# Patient Record
Sex: Female | Born: 1970 | Race: White | Hispanic: No | Marital: Married | Smoking: Never smoker
Health system: Southern US, Community
[De-identification: ages and names within clinical notes are randomized; demographics above are authoritative.]

## PROBLEM LIST (undated history)

## (undated) DIAGNOSIS — N189 Chronic kidney disease, unspecified: Secondary | ICD-10-CM

## (undated) DIAGNOSIS — F32A Depression, unspecified: Secondary | ICD-10-CM

## (undated) DIAGNOSIS — F329 Major depressive disorder, single episode, unspecified: Secondary | ICD-10-CM

## (undated) DIAGNOSIS — G4730 Sleep apnea, unspecified: Secondary | ICD-10-CM

## (undated) DIAGNOSIS — M7989 Other specified soft tissue disorders: Secondary | ICD-10-CM

## (undated) DIAGNOSIS — C649 Malignant neoplasm of unspecified kidney, except renal pelvis: Secondary | ICD-10-CM

## (undated) DIAGNOSIS — D649 Anemia, unspecified: Secondary | ICD-10-CM

## (undated) DIAGNOSIS — O1490 Unspecified pre-eclampsia, unspecified trimester: Secondary | ICD-10-CM

## (undated) DIAGNOSIS — Z8489 Family history of other specified conditions: Secondary | ICD-10-CM

## (undated) DIAGNOSIS — E049 Nontoxic goiter, unspecified: Secondary | ICD-10-CM

## (undated) DIAGNOSIS — O139 Gestational [pregnancy-induced] hypertension without significant proteinuria, unspecified trimester: Secondary | ICD-10-CM

## (undated) DIAGNOSIS — K219 Gastro-esophageal reflux disease without esophagitis: Secondary | ICD-10-CM

## (undated) HISTORY — DX: Other specified soft tissue disorders: M79.89

## (undated) HISTORY — DX: Anemia, unspecified: D64.9

## (undated) HISTORY — DX: Sleep apnea, unspecified: G47.30

## (undated) HISTORY — DX: Unspecified pre-eclampsia, unspecified trimester: O14.90

---

## 1996-05-01 DIAGNOSIS — O1490 Unspecified pre-eclampsia, unspecified trimester: Secondary | ICD-10-CM

## 1996-05-01 DIAGNOSIS — O139 Gestational [pregnancy-induced] hypertension without significant proteinuria, unspecified trimester: Secondary | ICD-10-CM

## 1996-05-01 HISTORY — DX: Unspecified pre-eclampsia, unspecified trimester: O14.90

## 1996-05-01 HISTORY — DX: Gestational (pregnancy-induced) hypertension without significant proteinuria, unspecified trimester: O13.9

## 2006-05-01 HISTORY — PX: VAGINAL HYSTERECTOMY: SUR661

## 2011-09-07 ENCOUNTER — Encounter (INDEPENDENT_AMBULATORY_CARE_PROVIDER_SITE_OTHER): Payer: Self-pay | Admitting: Surgery

## 2011-09-07 ENCOUNTER — Ambulatory Visit (INDEPENDENT_AMBULATORY_CARE_PROVIDER_SITE_OTHER): Payer: BC Managed Care – PPO | Admitting: Surgery

## 2011-09-07 NOTE — Progress Notes (Signed)
Re:   Krystal Reynolds DOB:   Sep 11, 1970 MRN:   [id number]  ASSESSMENT AND PLAN: 1.  Morbid obesity  She is interested in weight loss surgery.  She was originally interested in the lap band, but is now considering the sleeve.  I discussed with her that if she has the sleeve, Dr. Biagio Quint is the one doing this in our office and she would need to see him to set up the surgery.  I can get the ball rolling while she decides.  Per the 1991 NIH Consensus Statement, the patient is a candidate for bariatric surgery.  The patient attended our information session and reviewed the different types of bariatric surgery.    The patient is trying to decide between the laparoscopic adjustable gastric band vs the sleeve gastrectomy.  I discussed with the patient the indications and risks of weight loss surgery.  The potential risks of surgery include, but are not limited to, bleeding, infection, DVT and PE, slippage and erosion of the band, open surgery, leak from the stomach, and death.  The patient understands the importance of compliance and long term follow-up with our group after surgery.  She has been given permits for both the lap band and the sleeve gastrectomy.  From here we'll obtain labs, x-rays, nutrition consult, and psych consult.  2.  Sleep apnea.  On CPAP. 3.  Right ear pain.   Chief Complaint  Patient presents with  . Weight Loss Surgery   REFERRING PHYSICIAN:  Dr. Marrian Salvage, [place], [place]  HISTORY OF PRESENT ILLNESS: Krystal Reynolds is a 41 y.o. (DOB: 1970/05/08)  white female whose primary care physician is Dr. Marrian Salvage, [place], and comes to me today for bariatric surgery consideration.  She has been to information sessions at [facility] and at [facility].  She has come here for re-imbursement reasons.  The patient has tried different wires to lose weight since she was a teenager. She felt like she is always on a diet.  She has tried Weight Watchers multiple times, [employer],  and watching her calories. She's tried phentermine. In college she tried an over-the-counter herbal diets medicine, but it made her heart race.  She is usually successful losing some weight, but then the weight creeps back and she weighs more than when she started.    Past Medical History  Diagnosis Date  . Hypertension   . Leg swelling      History reviewed. No pertinent past surgical history.    Current Outpatient Prescriptions  Medication Sig Dispense Refill  . citalopram (CELEXA) 20 MG tablet Take 20 mg by mouth daily.      . Topiramate (TOPAMAX PO) Take by mouth daily.         No Known Allergies  REVIEW OF SYSTEMS: Skin:  No history of rash.  No history of abnormal moles. Infection:  Right ear pain.  Etiology unknown.  Sees Crystal Rose with [facility]. Neurologic:  No history of stroke.  No history of seizure.  No history of headaches. Cardiac: She had pre-eclampsia with her first child.  She is not on meds for BP now. And No history of seeing a cardiologist. Pulmonary:  Sleep apnea on CPAP.  Endocrine:  No diabetes. She says she has an enlarged thyroid, but is on no meds and has had no biopsy. Gastrointestinal:  No history of stomach disease.  No history of liver disease.  No history of gall bladder disease.  No history of  pancreas disease.  No history of colon disease. GYN:  She had a vaginal hysterectomy about 4 years ago for bleeding. Urologic:  She says that she has had a history of benign proteinuria.  She is unsure of the status of this. Musculoskeletal:  No history of joint or back disease. Hematologic:  No bleeding disorder.  No history of anemia.  Not anticoagulated. Psycho-social:  The patient is oriented.   The patient has no obvious psychologic or social impairment to understanding our conversation and plan.  SOCIAL and FAMILY HISTORY: Married.  Has 2 children 11 and 14. Works in Cabin crew ed at [employer]  PHYSICAL  EXAM: BP 120/82  Pulse 60  Temp(Src) 98.1 F (36.7 C) (Temporal)  Resp 18  Ht 5\' 7"  (1.702 m)  Wt 253 lb (114.76 kg)  BMI 39.63 kg/m2  General: WN Obese WF who is alert and generally healthy appearing.  HEENT: Normal. Pupils equal. Good dentition. Neck: Supple. No mass.  No thyroid mass. Lymph Nodes:  No supraclavicular or cervical nodes. Lungs: Clear to auscultation and symmetric breath sounds. Heart:  RRR. No murmur or rub. Abdomen: Soft. No mass. No tenderness. No hernia. Normal bowel sounds.  No abdominal scars.  About half apple and half pear.  Rectal: Not done. Extremities:  Good strength and ROM  in upper and lower extremities. Neurologic:  Grossly intact to motor and sensory function. Psychiatric: Has normal mood and affect. Behavior is normal.   DATA REVIEWED: Notes provided by patient.  Ovidio Kin, MD,  [facility] [address]  [address]   [place], [place]    [address] Phone:  [phone] FAX:  [phone]

## 2011-09-18 ENCOUNTER — Encounter (INDEPENDENT_AMBULATORY_CARE_PROVIDER_SITE_OTHER): Payer: Self-pay | Admitting: General Surgery

## 2011-09-18 ENCOUNTER — Ambulatory Visit (INDEPENDENT_AMBULATORY_CARE_PROVIDER_SITE_OTHER): Payer: BC Managed Care – PPO | Admitting: General Surgery

## 2011-09-18 NOTE — Progress Notes (Signed)
Patient ID: Krystal Reynolds, female   DOB: 30-Apr-1971, 41 y.o.   MRN: [id number]  Chief Complaint  Patient presents with  . New Evaluation    New Bari Gastric Sleeve    HPI Krystal Reynolds is a 41 y.o. female.  This patient presents for evaluation for weight loss surgery. She has attended our information session and originally saw my partner for evaluation but she has decided that she is most interested in the sleeve gastrectomy. She has struggled with her weight "as long as she can remember" and even as a child. She has tried several diets including Weight Watchers and Renaldo Fiddler and is not currently doing dedicated exercise but says that she maintains an active last. She denies any reflux. She currently has a BMI of 39 with comorbidities of obstructive sleep apnea, headaches, and depression. HPI  Past Medical History  Diagnosis Date  . Hypertension   . Leg swelling     Past Surgical History  Procedure Date  . Abdominal hysterectomy 2008    No family history on file.  Social History History  Substance Use Topics  . Smoking status: Never Smoker   . Smokeless tobacco: Not on file  . Alcohol Use: Yes     rarely    No Known Allergies  Current Outpatient Prescriptions  Medication Sig Dispense Refill  . citalopram (CELEXA) 20 MG tablet Take 20 mg by mouth daily.      . Topiramate (TOPAMAX PO) Take by mouth daily.        Review of Systems Review of Systems All other review of systems negative or noncontributory except as stated in the HPI  Blood pressure 130/86, pulse 60, temperature 97.6 F (36.4 C), temperature source Temporal, resp. rate 16, height 5\' 7"  (1.702 m), weight 249 lb 9.6 oz (113.218 kg).  Physical Exam Physical Exam Physical Exam  Nursing note and vitals reviewed. Constitutional: She is oriented to person, place, and time. She appears well-developed and well-nourished. No distress.  HENT:  Head: Normocephalic and atraumatic.  Mouth/Throat: No oropharyngeal  exudate.  Eyes: Conjunctivae and EOM are normal. Pupils are equal, round, and reactive to light. Right eye exhibits no discharge. Left eye exhibits no discharge. No scleral icterus.  Neck: Normal range of motion. Neck supple. No tracheal deviation present.  Cardiovascular: Normal rate, regular rhythm, normal heart sounds and intact distal pulses.   Pulmonary/Chest: Effort normal and breath sounds normal. No stridor. No respiratory distress. She has no wheezes.  Abdominal: Soft. Bowel sounds are normal. She exhibits no distension and no mass. There is no tenderness. There is no rebound and no guarding.  Musculoskeletal: Normal range of motion. She exhibits no edema and no tenderness.  Neurological: She is alert and oriented to person, place, and time.  Skin: Skin is warm and dry. No rash noted. She is not diaphoretic. No erythema. No pallor.  Psychiatric: She has a normal mood and affect. Her behavior is normal. Judgment and thought content normal.    Data Reviewed   Assessment    Morbid obesity with a BMI of 39 and comorbidities of sleep apnea, headaches, and depression I think that she be a fine candidate for any of our way of procedures including the lap band, sleeve gastrectomy, or Roux-en-Y gastric bypass. We discussed the pros and cons of each procedure today including the risks and benefits and she remains interested in a sleeve gastrectomy. The risks of infection, bleeding, pain, scarring, weight regain, too little or too much weight  loss, vitamin deficiencies and need for lifelong vitamin supplementation, hair loss, need for protein supplementation, leaks, stricture, reflux, food intolerance, need for reoperation and conversion to roux Y gastric bypass, need for open surgery, injury to spleen or surrounding structures, DVT's, PE, and death again discussed with the patient and the patient expressed understanding. She is restarted the workup of her laboratory studies enemas are to schedule  for her nutrition and psychology evaluation as well as an upper GI.     Plan    She will continue with the bariatric workup and follow up when complete.       Krystal Reynolds DAVID 09/18/2011, 10:00 AM

## 2011-09-20 ENCOUNTER — Encounter (HOSPITAL_COMMUNITY)
Admission: RE | Disposition: A | Discharge status: Home / Self Care | Payer: Self-pay | Source: Ambulatory Visit | Attending: Surgery

## 2011-09-20 ENCOUNTER — Ambulatory Visit (HOSPITAL_COMMUNITY)
Admission: RE | Admit: 2011-09-20 | Discharge: 2011-09-20 | Disposition: A | Discharge status: Home / Self Care | Payer: BC Managed Care – PPO | Source: Ambulatory Visit | Attending: Surgery | Admitting: Surgery

## 2011-09-20 HISTORY — PX: BREATH TEK H PYLORI: SHX5422

## 2011-09-20 SURGERY — BREATH TEST, FOR HELICOBACTER PYLORI
Anesthesia: Choice

## 2011-09-21 ENCOUNTER — Encounter (HOSPITAL_COMMUNITY): Payer: Self-pay

## 2011-09-21 ENCOUNTER — Encounter (HOSPITAL_COMMUNITY): Payer: Self-pay | Admitting: Surgery

## 2011-09-28 ENCOUNTER — Ambulatory Visit (HOSPITAL_COMMUNITY)
Admission: RE | Admit: 2011-09-28 | Discharge: 2011-09-28 | Disposition: A | Discharge status: Home / Self Care | Payer: BC Managed Care – PPO | Source: Ambulatory Visit | Attending: Surgery | Admitting: Surgery

## 2011-09-28 ENCOUNTER — Other Ambulatory Visit: Payer: Self-pay

## 2011-09-28 DIAGNOSIS — I10 Essential (primary) hypertension: Secondary | ICD-10-CM | POA: Insufficient documentation

## 2011-09-28 DIAGNOSIS — G4730 Sleep apnea, unspecified: Secondary | ICD-10-CM | POA: Insufficient documentation

## 2011-09-28 DIAGNOSIS — Z6839 Body mass index (BMI) 39.0-39.9, adult: Secondary | ICD-10-CM | POA: Insufficient documentation

## 2011-09-30 ENCOUNTER — Encounter: Payer: BC Managed Care – PPO | Attending: Obstetrics and Gynecology | Admitting: *Deleted

## 2011-09-30 ENCOUNTER — Encounter: Payer: Self-pay | Admitting: *Deleted

## 2011-09-30 DIAGNOSIS — Z01818 Encounter for other preprocedural examination: Secondary | ICD-10-CM | POA: Insufficient documentation

## 2011-09-30 DIAGNOSIS — Z713 Dietary counseling and surveillance: Secondary | ICD-10-CM | POA: Insufficient documentation

## 2011-09-30 NOTE — Progress Notes (Addendum)
  Pre-Op Assessment Visit:  Pre-Operative Gastric Sleeve Surgery  Medical Nutrition Therapy:  Appt start time: 0800   End time:  0900.  Patient was seen on 09/30/2011 for Pre-Operative RYGB or LAGB Nutrition Assessment. Assessment and letter of approval faxed to [facility] Bariatric Surgery Program coordinator on 09/30/2011.  Approval letter sent to [facility] and will be available in the chart under the media tab.  TANITA  BODY COMP RESULTS  09/30/11   %Fat 49.4%   Fat Mass (lbs) 122.5   Fat Free Mass (lbs) 125.5   Total Body Water (lbs) 92.0   Handouts given during visit include:  Pre-Op Goals   Bariatric Surgery Protein Shakes  Low CHO Snack List  Patient to call for Pre-Op and Post-Op Nutrition Education at [facility] when surgery is scheduled.

## 2011-09-30 NOTE — Patient Instructions (Addendum)
   Follow Pre-Op Nutrition Goals to prepare for Gastric Sleeve Surgery.   Call the [facility] at [phone] once you have been given your surgery date to enrolled in the Pre-Op Nutrition Class. You will need to attend this nutrition class 3-4 weeks prior to your surgery.  

## 2011-10-03 ENCOUNTER — Other Ambulatory Visit (INDEPENDENT_AMBULATORY_CARE_PROVIDER_SITE_OTHER): Payer: Self-pay | Admitting: General Surgery

## 2011-10-03 DIAGNOSIS — K862 Cyst of pancreas: Secondary | ICD-10-CM

## 2011-10-09 ENCOUNTER — Other Ambulatory Visit: Payer: BC Managed Care – PPO

## 2011-10-10 ENCOUNTER — Telehealth (INDEPENDENT_AMBULATORY_CARE_PROVIDER_SITE_OTHER): Payer: Self-pay

## 2011-10-10 ENCOUNTER — Other Ambulatory Visit (INDEPENDENT_AMBULATORY_CARE_PROVIDER_SITE_OTHER): Payer: Self-pay

## 2011-10-10 DIAGNOSIS — K862 Cyst of pancreas: Secondary | ICD-10-CM

## 2011-10-10 NOTE — Telephone Encounter (Signed)
Kara at [facility] called to ask for order on pt to be changed to CT with and without. She states Dr Biagio Quint called and wants both but order put in with only. You can reach [place] at [phone].

## 2011-10-13 ENCOUNTER — Other Ambulatory Visit: Payer: BC Managed Care – PPO

## 2011-10-13 ENCOUNTER — Telehealth (INDEPENDENT_AMBULATORY_CARE_PROVIDER_SITE_OTHER): Payer: Self-pay

## 2011-10-13 ENCOUNTER — Ambulatory Visit
Admission: RE | Admit: 2011-10-13 | Discharge: 2011-10-13 | Disposition: A | Discharge status: Home / Self Care | Payer: BC Managed Care – PPO | Source: Ambulatory Visit | Attending: General Surgery | Admitting: General Surgery

## 2011-10-13 ENCOUNTER — Telehealth (INDEPENDENT_AMBULATORY_CARE_PROVIDER_SITE_OTHER): Payer: Self-pay | Admitting: General Surgery

## 2011-10-13 ENCOUNTER — Inpatient Hospital Stay
Admission: RE | Admit: 2011-10-13 | Discharge: 2011-10-13 | Payer: BC Managed Care – PPO | Source: Ambulatory Visit | Attending: General Surgery | Admitting: General Surgery

## 2011-10-13 DIAGNOSIS — K862 Cyst of pancreas: Secondary | ICD-10-CM

## 2011-10-13 DIAGNOSIS — N2889 Other specified disorders of kidney and ureter: Secondary | ICD-10-CM

## 2011-10-13 MED ORDER — IOHEXOL 300 MG/ML  SOLN
125.0000 mL | Freq: Once | INTRAMUSCULAR | Status: AC | PRN
  Administered 2011-10-13: 125 mL via INTRAVENOUS

## 2011-10-13 NOTE — Telephone Encounter (Signed)
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  I have an appointment set up for her 10/30/11 at 2:15pm with Dr. Donell Beers and I have placed a consult for urology eval as well. She expressed understanding and agreed to follow up with these specialists.  We will hold on weight loss surgery for now until this is resolved.

## 2011-10-13 NOTE — Telephone Encounter (Signed)
Patient given CT abd/pelvis results by Dr. Biagio Quint, patient need's further work up and has been referred to Dr. Donell Beers for evaluation & treatment.  Patient has an appointment on 10/30/2011 @ 2:15 pm and has been notified, per Dr. Biagio Quint patient needs Urology Referral as well, we will refer patient to [facility].  Weight Loss Surgery can be discussed at a later date.

## 2011-10-30 ENCOUNTER — Encounter (INDEPENDENT_AMBULATORY_CARE_PROVIDER_SITE_OTHER): Payer: Self-pay | Admitting: General Surgery

## 2011-10-30 ENCOUNTER — Other Ambulatory Visit (INDEPENDENT_AMBULATORY_CARE_PROVIDER_SITE_OTHER): Payer: Self-pay | Admitting: General Surgery

## 2011-10-30 ENCOUNTER — Ambulatory Visit (INDEPENDENT_AMBULATORY_CARE_PROVIDER_SITE_OTHER): Payer: BC Managed Care – PPO | Admitting: General Surgery

## 2011-10-30 VITALS — BP 152/100 | HR 72 | Temp 98.4°F | Resp 18 | Ht 67.0 in | Wt 246.8 lb

## 2011-10-30 DIAGNOSIS — K869 Disease of pancreas, unspecified: Secondary | ICD-10-CM

## 2011-10-30 DIAGNOSIS — K8689 Other specified diseases of pancreas: Secondary | ICD-10-CM

## 2011-10-30 NOTE — Assessment & Plan Note (Addendum)
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  Could be pseudocyst vs cystic neoplasm.   Will refer to Dr. Christella Hartigan for endoscopic ultrasound.   Will also make sure she has seen urology for right kidney mass.    Unless area is consistent with pseudocyst, would resect due to total size of mass.   Briefly discussed whipple with pt.    Will follow up in 2 weeks after urology evaluation and EUS.

## 2011-10-30 NOTE — Patient Instructions (Addendum)
Get endoscopic ultrasound and biopsy.  See urology.    Follow up with me after those events.

## 2011-10-31 ENCOUNTER — Telehealth (INDEPENDENT_AMBULATORY_CARE_PROVIDER_SITE_OTHER): Payer: Self-pay

## 2011-10-31 LAB — CANCER ANTIGEN 19-9: CA 19-9: 4.3 U/mL (ref <35.0)

## 2011-10-31 NOTE — Telephone Encounter (Signed)
Pt notified that records/labs were faxed to Dr. Laverle Patter at [facility].  They will call me with an appt, and I will let her know.

## 2011-11-01 ENCOUNTER — Other Ambulatory Visit (INDEPENDENT_AMBULATORY_CARE_PROVIDER_SITE_OTHER): Payer: Self-pay | Admitting: General Surgery

## 2011-11-01 DIAGNOSIS — K8689 Other specified diseases of pancreas: Secondary | ICD-10-CM

## 2011-11-01 NOTE — Progress Notes (Signed)
Quick Note:  Please let pt know tumor marker is normal and NOT elevated ______

## 2011-11-03 ENCOUNTER — Telehealth: Payer: Self-pay

## 2011-11-03 DIAGNOSIS — K862 Cyst of pancreas: Secondary | ICD-10-CM

## 2011-11-03 NOTE — Telephone Encounter (Signed)
Message copied by Donata Duff on Fri Nov 03, 2011  1:26 PM ------      Message from: Rachael Fee      Created: Fri Nov 03, 2011 12:58 PM      Regarding: RE: Pt need EUS scheduled      Contact: [phone]       She needs upper EUS, radial +/- linear.  Next available eus Thursday with propofol.  Dx: pancreatic cysts            Thanks            ----- Message -----         From: Donata Duff, CMA         Sent: 11/03/2011  11:56 AM           To: Rachael Fee, MD      Subject: FW: Pt need EUS scheduled                                            ----- Message -----         From: Erin Sons         Sent: 11/03/2011  11:42 AM           To: Donata Duff, CMA      Subject: Pt need EUS scheduled                                    Hi Patti,            This pt has a referral in Epic and need to see Dr Christella Hartigan for EUS. Pt can't be seen 7/10-7/13 won't be available these dates.            Thanks      Erin Sons      Referral Coordinator      [phone] ext [phone]

## 2011-11-06 ENCOUNTER — Other Ambulatory Visit: Payer: Self-pay

## 2011-11-07 ENCOUNTER — Telehealth (INDEPENDENT_AMBULATORY_CARE_PROVIDER_SITE_OTHER): Payer: Self-pay

## 2011-11-07 NOTE — Telephone Encounter (Signed)
Pt is scheduled to see Dr. Pamala Hurry on 11/21/11 at 9:00 a.m.  She is aware.

## 2011-11-07 NOTE — Telephone Encounter (Signed)
Left message on machine to call back  

## 2011-11-08 NOTE — Progress Notes (Signed)
Chief Complaint  Patient presents with  . Other    new pt- eval pancreatic pseudocyst    HISTORY: Pt is a 40 year old female who presented with an incidentally discovered pancreatic mass as part of the work up for bariatric surgery.  She also was found to have a kidney mass. She had abdominal pain at one point but has not history of pancreatitis that she is aware of. She is not a drinker. She takes minimal medications. She denies diabetes. She has no family history of pancreatic cancer or pancreatic masses. She denies diarrhea or other signs of pancreatic insufficiency. She is not having chronic abdominal pain at this point. She denies nausea, vomiting, early satiety. She denies weight loss.  Past Medical History  Diagnosis Date  . Hypertension   . Leg swelling   . Preeclampsia 1998    first pregnancy - not currently pregnant  . Sleep apnea   . Anemia     Past Surgical History  Procedure Date  . Abdominal hysterectomy 2008  . Breath tek h pylori 09/20/2011    Procedure: BREATH TEK H PYLORI;  Surgeon: David H Newman, MD;  Location: [facility];  Service: General;  Laterality: N/A;    Current Outpatient Prescriptions  Medication Sig Dispense Refill  . citalopram (CELEXA) 20 MG tablet Take 20 mg by mouth daily.      . Topiramate (TOPAMAX PO) Take 100 mg by mouth daily.          No Known Allergies   Family History  Problem Relation Age of Onset  . Cancer Paternal Uncle     lung     History   Social History  . Marital Status: Married    Spouse Name: N/A    Number of Children: N/A  . Years of Education: N/A   Social History Main Topics  . Smoking status: Never Smoker   . Smokeless tobacco: None  . Alcohol Use: Yes     rarely  . Drug Use: No  . Sexually Active:     REVIEW OF SYSTEMS - PERTINENT POSITIVES ONLY: 12 point review of systems negative other than HPI and PMH.    EXAM: Filed Vitals:   10/30/11 1358  BP: 152/100  Pulse: 72  Temp: 98.4 F (36.9 C)    Resp: 18    Gen:  No acute distress.  Well nourished and well groomed.   Neurological: Alert and oriented to person, place, and time. Coordination normal.  Head: Normocephalic and atraumatic.  Eyes: Conjunctivae are normal. Pupils are equal, round, and reactive to light. No scleral icterus.  Neck: Normal range of motion. Neck supple. No tracheal deviation or thyromegaly present.  Cardiovascular: Normal rate, regular rhythm, normal heart sounds and intact distal pulses.  Exam reveals no gallop and no friction rub.  No murmur heard. Respiratory: Effort normal.  No respiratory distress. No chest wall tenderness. Breath sounds normal.  No wheezes, rales or rhonchi.  GI: Soft. Bowel sounds are normal. The abdomen is soft and nontender.  There is no rebound and no guarding.  Musculoskeletal: Normal range of motion. Extremities are nontender.  Lymphadenopathy: No cervical, preauricular, postauricular or axillary adenopathy is present Skin: Skin is warm and dry. No rash noted. No diaphoresis. No erythema. No pallor. No clubbing, cyanosis, or edema.   Psychiatric: Normal mood and affect. Behavior is normal. Judgment and thought content normal.    LABORATORY RESULTS: CBC with elevated WBCs, normal LFTs.     RADIOLOGY RESULTS: CT   scan abd/pelvis IMPRESSION:  1. Lobular cystic thin-walled structure associated with the  pancreatic head. Possible pancreatic pseudocyts, but cannot  exclude cystic pancreatic neoplasm. If there is no history of  pancreatitis then endoscopic ultrasound is recommended. 2.8 + 2.0 cm 2. However, there is an enhancing rounded mass emanating from the  lower pole of the right kidney of 3.0 x 2.6 x 3.0 cm highly  suspicious for renal cell carcinoma. No adenopathy is seen.   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  Could be pseudocyst vs cystic neoplasm.   Will refer to Dr. Jacobs for endoscopic ultrasound.    Will also make sure she has seen urology for right kidney mass.    Unless area is consistent with pseudocyst, would resect due to total size of mass.   Briefly discussed whipple with pt.    Will follow up in 2 weeks after urology evaluation and EUS.     Eriona Kinchen L Braeleigh Pyper MD Surgical Oncology, General and Endocrine Surgery [facility]      Visit Diagnoses: 1. Pancreatic head mass     Primary Care Physician: DORTON,PHILLIP K., MD    

## 2011-11-08 NOTE — Telephone Encounter (Signed)
Pt returned call and was given her date and time she was unable to keep that appt date and it was changed to 12/07/11 1130 am pt is aware and has been instructed.  Noreene Larsson was called and appt changed and confirmed

## 2011-11-10 ENCOUNTER — Telehealth (INDEPENDENT_AMBULATORY_CARE_PROVIDER_SITE_OTHER): Payer: Self-pay

## 2011-11-10 NOTE — Telephone Encounter (Signed)
Pt notified of lab result per Dr Arita Miss request.

## 2011-11-10 NOTE — Telephone Encounter (Signed)
LMOM for pt to call for lab results. If pt returns call the ca 19-9 is normal [ not elevated].

## 2011-11-13 ENCOUNTER — Encounter (INDEPENDENT_AMBULATORY_CARE_PROVIDER_SITE_OTHER): Payer: BC Managed Care – PPO | Admitting: General Surgery

## 2011-12-07 ENCOUNTER — Ambulatory Visit (HOSPITAL_COMMUNITY): Payer: BC Managed Care – PPO | Admitting: Anesthesiology

## 2011-12-07 ENCOUNTER — Ambulatory Visit (HOSPITAL_COMMUNITY)
Admission: RE | Admit: 2011-12-07 | Discharge: 2011-12-07 | Disposition: A | Discharge status: Home / Self Care | Payer: BC Managed Care – PPO | Source: Ambulatory Visit | Attending: Gastroenterology | Admitting: Gastroenterology

## 2011-12-07 ENCOUNTER — Encounter (HOSPITAL_COMMUNITY): Payer: Self-pay | Admitting: Anesthesiology

## 2011-12-07 ENCOUNTER — Encounter (HOSPITAL_COMMUNITY)
Admission: RE | Disposition: A | Discharge status: Home / Self Care | Payer: Self-pay | Source: Ambulatory Visit | Attending: Gastroenterology

## 2011-12-07 ENCOUNTER — Encounter (HOSPITAL_COMMUNITY): Payer: Self-pay

## 2011-12-07 DIAGNOSIS — Z79899 Other long term (current) drug therapy: Secondary | ICD-10-CM | POA: Insufficient documentation

## 2011-12-07 DIAGNOSIS — K863 Pseudocyst of pancreas: Secondary | ICD-10-CM | POA: Insufficient documentation

## 2011-12-07 DIAGNOSIS — K8689 Other specified diseases of pancreas: Secondary | ICD-10-CM

## 2011-12-07 DIAGNOSIS — K862 Cyst of pancreas: Secondary | ICD-10-CM | POA: Insufficient documentation

## 2011-12-07 DIAGNOSIS — G4730 Sleep apnea, unspecified: Secondary | ICD-10-CM | POA: Insufficient documentation

## 2011-12-07 DIAGNOSIS — N2889 Other specified disorders of kidney and ureter: Secondary | ICD-10-CM

## 2011-12-07 DIAGNOSIS — I10 Essential (primary) hypertension: Secondary | ICD-10-CM | POA: Insufficient documentation

## 2011-12-07 DIAGNOSIS — K869 Disease of pancreas, unspecified: Secondary | ICD-10-CM

## 2011-12-07 HISTORY — PX: EUS: SHX5427

## 2011-12-07 SURGERY — UPPER ENDOSCOPIC ULTRASOUND (EUS) LINEAR
Anesthesia: General

## 2011-12-07 MED ORDER — FENTANYL CITRATE 0.05 MG/ML IJ SOLN
INTRAMUSCULAR | Status: DC | PRN
  Administered 2011-12-07: 100 ug via INTRAVENOUS

## 2011-12-07 MED ORDER — CIPROFLOXACIN IN D5W 400 MG/200ML IV SOLN
INTRAVENOUS | Status: AC
  Filled 2011-12-07: qty 200

## 2011-12-07 MED ORDER — BUTAMBEN-TETRACAINE-BENZOCAINE 2-2-14 % EX AERO
INHALATION_SPRAY | CUTANEOUS | Status: DC | PRN
  Administered 2011-12-07: 2 via TOPICAL

## 2011-12-07 MED ORDER — CIPROFLOXACIN HCL 500 MG PO TABS: 500.0000 mg | ORAL_TABLET | Freq: Two times a day (BID) | ORAL | Status: AC

## 2011-12-07 MED ORDER — CIPROFLOXACIN IN D5W 400 MG/200ML IV SOLN
400.0000 mg | Freq: Two times a day (BID) | INTRAVENOUS | Status: DC
  Administered 2011-12-07: 400 mg via INTRAVENOUS

## 2011-12-07 MED ORDER — MIDAZOLAM HCL 5 MG/5ML IJ SOLN
INTRAMUSCULAR | Status: DC | PRN
  Administered 2011-12-07: 2 mg via INTRAVENOUS
  Administered 2011-12-07: 1 mg via INTRAVENOUS

## 2011-12-07 MED ORDER — KETAMINE HCL 10 MG/ML IJ SOLN
INTRAMUSCULAR | Status: DC | PRN
  Administered 2011-12-07 (×3): 10 mg via INTRAVENOUS

## 2011-12-07 MED ORDER — LACTATED RINGERS IV SOLN
INTRAVENOUS | Status: DC | PRN
  Administered 2011-12-07: 10:00:00 via INTRAVENOUS

## 2011-12-07 MED ORDER — PROPOFOL 10 MG/ML IV EMUL
INTRAVENOUS | Status: DC | PRN
  Administered 2011-12-07: 100 ug/kg/min via INTRAVENOUS

## 2011-12-07 NOTE — Discharge Instructions (Signed)

## 2011-12-07 NOTE — Anesthesia Postprocedure Evaluation (Signed)
  Anesthesia Post-op Note  Patient: Krystal Reynolds  Procedure(s) Performed: Procedure(s) (LRB): UPPER ENDOSCOPIC ULTRASOUND (EUS) LINEAR (N/A)  Patient Location: PACU  Anesthesia Type: MAC  Level of Consciousness: awake and alert   Airway and Oxygen Therapy: Patient Spontanous Breathing  Post-op Pain: mild  Post-op Assessment: Post-op Vital signs reviewed, Patient's Cardiovascular Status Stable, Respiratory Function Stable, Patent Airway and No signs of Nausea or vomiting  Post-op Vital Signs: stable  Complications: No apparent anesthesia complications

## 2011-12-07 NOTE — Transfer of Care (Signed)
Immediate Anesthesia Transfer of Care Note  Patient: Krystal Reynolds  Procedure(s) Performed: Procedure(s) (LRB): UPPER ENDOSCOPIC ULTRASOUND (EUS) LINEAR (N/A)  Patient Location: PACU and Endoscopy Unit  Anesthesia Type: MAC  Level of Consciousness: awake, alert , oriented and patient cooperative  Airway & Oxygen Therapy: Patient Spontanous Breathing and Patient connected to nasal cannula oxygen  Post-op Assessment: Report given to PACU RN and Post -op Vital signs reviewed and stable  Post vital signs: Reviewed and stable  Complications: No apparent anesthesia complications

## 2011-12-07 NOTE — Op Note (Signed)
[facility] [address] [place], [place]  [address]  ENDOSCOPIC ULTRASOUND PROCEDURE REPORT PATIENT:  Krystal Reynolds, Krystal Reynolds  MR#:  [id number] BIRTHDATE:  1970-08-01  GENDER:  female ENDOSCOPIST:  Rachael Fee, MD REFERRED BY:  Almond Lint, M.D. PROCEDURE DATE:  12/07/2011 PROCEDURE:  Upper EUS w/FNA ASA CLASS:  Class III INDICATIONS:  incidentally noted pancreatic head/neck cyst. On same CT a right renal mass was also noted, urology appt next week. No history of pancreatitis, non-drinker, weight stable, no pancreatic disease in family MEDICATIONS:   MAC sedation, administered by CRNA DESCRIPTION OF PROCEDURE:   After the risks benefits and alternatives of the procedure were  explained, informed consent was obtained. The patient was then placed in the left, lateral, decubitus postion and IV sedation was administered. Throughout the procedure, the patient's blood pressure, pulse and oxygen saturations were monitored continuously.  Under direct visualization, the 110036 endoscope was introduced through the mouth and advanced to the second portion of the duodenum.  Water was used as necessary to provide an acoustic interface.  Upon completion of the imaging, water was removed and the patient was sent to the recovery room in satisfactory condition. <<PROCEDUREIMAGES>> Endoscopic findings: 1. Normal UGI tract  EUS findings: 1. Mutlicystic lesion in head/neck of pancreas with thin septea and no associated solid masses or mural nodules. The cystic process measures 4.1cm maximally. Largest cyst cavity 2.5cm across.  The main pancreatic duct appears to communicate with the cyst.  A single pass with a 22 gauge BS EUS FNA needle was used to incompletely aspirate the cystic fluid.  17cc of thin, amber colored fluid was aspirated. 2. Main pancreatic duct appears to communicate with the cystic lesion but was otherwise normal (non-dilated). 3. Pancreatic parenchyma was otherwise  normal apeparing without solid mass lesions or signs of chronic pancreatitis. 4. No peripancreatic adenopathy. 5. CBD was normal, non-dilated 6. Limited views of liver, spleen, portal and splenic vessels were all normal  Impression: 4.1cm multicystic lesion in head/neck of pancreas, appears to communicate with main pancreatic duct. No concerning features of associated solid masses or main pancreatic duct dilation.  The cystic lesion was aspirated thin, amber fluid was sent for CEA, amylase and cytology.  She will comlete 3 days of cipro.  Of important note, she is seeing a Urologist about the right renal mass next week.  ______________________________ Rachael Fee, MD  n. eSIGNED:   Rachael Fee at 12/07/2011 11:18 AM  Hiram Comber, [id number]

## 2011-12-07 NOTE — Anesthesia Preprocedure Evaluation (Addendum)
Anesthesia Evaluation  Patient identified by MRN, date of birth, ID band Patient awake    Reviewed: Allergy & Precautions, H&P , NPO status , Patient's Chart, lab work & pertinent test results  Airway       Dental   Pulmonary sleep apnea and Continuous Positive Airway Pressure Ventilation ,          Cardiovascular hypertension,     Neuro/Psych  Headaches, negative psych ROS   GI/Hepatic negative GI ROS, Neg liver ROS,   Endo/Other  negative endocrine ROS  Renal/GU negative Renal ROS  negative genitourinary   Musculoskeletal negative musculoskeletal ROS (+)   Abdominal (+) + obese,   Peds negative pediatric ROS (+)  Hematology negative hematology ROS (+)   Anesthesia Other Findings   Reproductive/Obstetrics negative OB ROS S/p hysterectomy.                         Anesthesia Physical Anesthesia Plan  ASA: II  Anesthesia Plan: MAC   Post-op Pain Management:    Induction: Intravenous  Airway Management Planned:   Additional Equipment:   Intra-op Plan:   Post-operative Plan: Extubation in OR  Informed Consent: I have reviewed the patients History and Physical, chart, labs and discussed the procedure including the risks, benefits and alternatives for the proposed anesthesia with the patient or authorized representative who has indicated his/her understanding and acceptance.   Dental advisory given  Plan Discussed with: CRNA  Anesthesia Plan Comments:        Anesthesia Quick Evaluation

## 2011-12-07 NOTE — Interval H&P Note (Signed)
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 After consideration of risks, benefits and other options for treatment, the patient has consented to  Procedure(s) (LRB): UPPER ENDOSCOPIC ULTRASOUND (EUS) LINEAR (N/A) as a surgical intervention .  The patient's history has been reviewed, patient examined, no change in status, stable for surgery.  I have reviewed the patient's chart and labs.  Questions were answered to the patient's satisfaction.     Rob Bunting

## 2011-12-07 NOTE — Progress Notes (Signed)
Vital signs during EUS procedure taken by Andrey Campanile, CRNA

## 2011-12-07 NOTE — H&P (View-Only) (Signed)
Chief Complaint  Patient presents with  . Other    new pt- eval pancreatic pseudocyst    HISTORY: Pt is a 41 year old female who presented with an incidentally discovered pancreatic mass as part of the work up for bariatric surgery.  She also was found to have a kidney mass. She had abdominal pain at one point but has not history of pancreatitis that she is aware of. She is not a drinker. She takes minimal medications. She denies diabetes. She has no family history of pancreatic cancer or pancreatic masses. She denies diarrhea or other signs of pancreatic insufficiency. She is not having chronic abdominal pain at this point. She denies nausea, vomiting, early satiety. She denies weight loss.  Past Medical History  Diagnosis Date  . Hypertension   . Leg swelling   . Preeclampsia 1998    first pregnancy - not currently pregnant  . Sleep apnea   . Anemia     Past Surgical History  Procedure Date  . Abdominal hysterectomy 2008  . Breath tek h pylori 09/20/2011    Procedure: BREATH TEK H PYLORI;  Surgeon: Kandis Cocking, MD;  Location: [facility];  Service: General;  Laterality: N/A;    Current Outpatient Prescriptions  Medication Sig Dispense Refill  . citalopram (CELEXA) 20 MG tablet Take 20 mg by mouth daily.      . Topiramate (TOPAMAX PO) Take 100 mg by mouth daily.          No Known Allergies   Family History  Problem Relation Age of Onset  . Cancer Paternal Uncle     lung     History   Social History  . Marital Status: Married    Spouse Name: N/A    Number of Children: N/A  . Years of Education: N/A   Social History Main Topics  . Smoking status: Never Smoker   . Smokeless tobacco: None  . Alcohol Use: Yes     rarely  . Drug Use: No  . Sexually Active:     REVIEW OF SYSTEMS - PERTINENT POSITIVES ONLY: 12 point review of systems negative other than HPI and PMH.    EXAM: Filed Vitals:   10/30/11 1358  BP: 152/100  Pulse: 72  Temp: 98.4 F (36.9 C)    Resp: 18    Gen:  No acute distress.  Well nourished and well groomed.   Neurological: Alert and oriented to person, place, and time. Coordination normal.  Head: Normocephalic and atraumatic.  Eyes: Conjunctivae are normal. Pupils are equal, round, and reactive to light. No scleral icterus.  Neck: Normal range of motion. Neck supple. No tracheal deviation or thyromegaly present.  Cardiovascular: Normal rate, regular rhythm, normal heart sounds and intact distal pulses.  Exam reveals no gallop and no friction rub.  No murmur heard. Respiratory: Effort normal.  No respiratory distress. No chest wall tenderness. Breath sounds normal.  No wheezes, rales or rhonchi.  GI: Soft. Bowel sounds are normal. The abdomen is soft and nontender.  There is no rebound and no guarding.  Musculoskeletal: Normal range of motion. Extremities are nontender.  Lymphadenopathy: No cervical, preauricular, postauricular or axillary adenopathy is present Skin: Skin is warm and dry. No rash noted. No diaphoresis. No erythema. No pallor. No clubbing, cyanosis, or edema.   Psychiatric: Normal mood and affect. Behavior is normal. Judgment and thought content normal.    LABORATORY RESULTS: CBC with elevated WBCs, normal LFTs.     RADIOLOGY RESULTS: CT  scan abd/pelvis IMPRESSION:  1. Lobular cystic thin-walled structure associated with the  pancreatic head. Possible pancreatic pseudocyts, but cannot  exclude cystic pancreatic neoplasm. If there is no history of  pancreatitis then endoscopic ultrasound is recommended. 2.8 + 2.0 cm 2. However, there is an enhancing rounded mass emanating from the  lower pole of the right kidney of 3.0 x 2.6 x 3.0 cm highly  suspicious for renal cell carcinoma. No adenopathy is seen.   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  Could be pseudocyst vs cystic neoplasm.   Will refer to Dr. Christella Hartigan for endoscopic ultrasound.    Will also make sure she has seen urology for right kidney mass.    Unless area is consistent with pseudocyst, would resect due to total size of mass.   Briefly discussed whipple with pt.    Will follow up in 2 weeks after urology evaluation and EUS.     Maudry Diego MD Surgical Oncology, General and Endocrine Surgery [facility]      Visit Diagnoses: 1. Pancreatic head mass     Primary Care Physician: Bernita Raisin., MD

## 2011-12-08 ENCOUNTER — Encounter (HOSPITAL_COMMUNITY): Payer: Self-pay | Admitting: Gastroenterology

## 2011-12-11 ENCOUNTER — Encounter (INDEPENDENT_AMBULATORY_CARE_PROVIDER_SITE_OTHER): Payer: Self-pay

## 2011-12-12 ENCOUNTER — Other Ambulatory Visit: Payer: Self-pay | Admitting: Urology

## 2011-12-12 ENCOUNTER — Telehealth: Payer: Self-pay | Admitting: Gastroenterology

## 2011-12-12 ENCOUNTER — Other Ambulatory Visit (HOSPITAL_COMMUNITY): Payer: Self-pay | Admitting: Urology

## 2011-12-12 ENCOUNTER — Ambulatory Visit (HOSPITAL_COMMUNITY)
Admission: RE | Admit: 2011-12-12 | Discharge: 2011-12-12 | Disposition: A | Discharge status: Home / Self Care | Payer: BC Managed Care – PPO | Source: Ambulatory Visit | Attending: Urology | Admitting: Urology

## 2011-12-12 DIAGNOSIS — D4959 Neoplasm of unspecified behavior of other genitourinary organ: Secondary | ICD-10-CM

## 2011-12-28 ENCOUNTER — Encounter (HOSPITAL_COMMUNITY): Payer: Self-pay | Admitting: Pharmacy Technician

## 2011-12-31 HISTORY — PX: ROBOTIC ASSITED PARTIAL NEPHRECTOMY: SHX6087

## 2012-01-02 ENCOUNTER — Encounter (HOSPITAL_COMMUNITY)
Admission: RE | Admit: 2012-01-02 | Discharge: 2012-01-02 | Disposition: A | Discharge status: Home / Self Care | Payer: BC Managed Care – PPO | Source: Ambulatory Visit | Attending: Urology | Admitting: Urology

## 2012-01-02 ENCOUNTER — Encounter (HOSPITAL_COMMUNITY): Payer: Self-pay

## 2012-01-02 DIAGNOSIS — D649 Anemia, unspecified: Secondary | ICD-10-CM

## 2012-01-02 DIAGNOSIS — M7989 Other specified soft tissue disorders: Secondary | ICD-10-CM

## 2012-01-02 DIAGNOSIS — N189 Chronic kidney disease, unspecified: Secondary | ICD-10-CM

## 2012-01-02 DIAGNOSIS — E049 Nontoxic goiter, unspecified: Secondary | ICD-10-CM

## 2012-01-02 DIAGNOSIS — G4730 Sleep apnea, unspecified: Secondary | ICD-10-CM

## 2012-01-02 HISTORY — DX: Nontoxic goiter, unspecified: E04.9

## 2012-01-02 HISTORY — DX: Other specified soft tissue disorders: M79.89

## 2012-01-02 HISTORY — DX: Anemia, unspecified: D64.9

## 2012-01-02 HISTORY — DX: Chronic kidney disease, unspecified: N18.9

## 2012-01-02 HISTORY — PX: OTHER SURGICAL HISTORY: SHX169

## 2012-01-02 HISTORY — DX: Sleep apnea, unspecified: G47.30

## 2012-01-02 LAB — BASIC METABOLIC PANEL
BUN: 12 mg/dL (ref 6–23)
Calcium: 9.5 mg/dL (ref 8.4–10.5)
Creatinine, Ser: 0.9 mg/dL (ref 0.50–1.10)
GFR calc Af Amer: >90 mL/min (ref >90)
GFR calc non Af Amer: 79 mL/min — ABNORMAL LOW (ref >90)

## 2012-01-02 LAB — CBC
MCHC: 34.6 g/dL (ref 30.0–36.0)
RDW: 12.5 % (ref 11.5–15.5)

## 2012-01-02 LAB — SURGICAL PCR SCREEN: MRSA, PCR: NEGATIVE

## 2012-01-02 NOTE — Patient Instructions (Addendum)
21 Krystal Reynolds  01/02/2012   Your procedure is scheduled on:   01-08-2012  Report to [facility] at    0515    AM.              Call this number if you have problems the morning of surgery: [phone] or prior to Presurgical testing [phone]   Remember: Bring Cpap mask and tubing .   Do not eat food:After Midnight.  Follow any bowel prep instructions per Dr. Vevelyn Royals office.             Take these medicines the morning of surgery with A SIP OF WATER: Celexa   Do not wear jewelry, make-up or nail polish.  Do not wear lotions, powders, or perfumes. You may wear deodorant.  Do not shave 48 hours prior to surgery.(face and neck okay, no shaving of legs)  Do not bring valuables to the hospital.  Contacts, dentures or bridgework may not be worn into surgery.  Leave suitcase in the car. After surgery it may be brought to your room.  For patients admitted to the hospital, checkout time is 11:00 AM the day of discharge.   Patients discharged the day of surgery will not be allowed to drive home.  Name and phone number of your driver: Krystal Reynolds, spouse [phone] Special Instructions: CHG Shower Use Special Wash: 1/2 bottle night before surgery and 1/2 bottle morning of surgery.(avoid face and genitals)   Please read over the following fact sheets that you were given: MRSA Information, Blood Transfusion fact sheet, Incentive Spirometry Instruction.

## 2012-01-02 NOTE — Pre-Procedure Instructions (Signed)
01-02-12 EKG/ CT Abdomen -09-28-11-Epic, CXR -12-12-11-Epic.

## 2012-01-07 NOTE — H&P (Signed)
Chief Complaint  Right renal neoplasm   Reason For Visit  Reason for consult: Evaluation and treatment of her right renal neoplasm Physician requesting consult: Dr. Almond Lint   History of Present Illness  [name] is a 41 year old female who was undergoing an evaluation for bariatric surgery which included a screening abdominal ultrasound on 09/28/11 when she was found to have an incidentally noted cystic mass of the pancreas.  She was subsequently evaluated by Dr. 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 Christella Hartigan. In addition, she was noted on her CT scan to also have an incidental 3.0 x 2.6 cm enhancing mass off the lower pole of the right kidney concerning for renal cell carcinoma.  Her contralateral kidney appears normal. She appears to have single right renal vessels.  There is no evidence of adrenal abnormalities, regional lymphadenopathy, renal vein/IVC involvement, or other abdominal metastases.    She denies any hematuria or abdominal pain complaints. She has no family history of kidney cancer. She did have proteinuria as a child and underwent a kidney biopsy which apparently did not demonstrate any concerning findings. Her proteinuria subsequently resolved and she has had no problems related to chronic kidney disease. She did recently undergo a biopsy of a pancreatic mass by Dr. Wendall Papa.  Per the patient, she did receive the results from Dr. Christella Hartigan recently and apparently there were no findings to raise concern for malignancy.   Past Medical History Problems  1. History of  Adult Sleep Apnea 780.57 2. History of  Anemia 285.9 3. History of  Hypertension 401.9   She does use CPAP at home.   Surgical History Problems  1. History of  Hysterectomy V45.77  Current Meds 1.  Citalopram Hydrobromide 20 MG Oral Tablet; Therapy: (Recorded:08Aug2013) to 2. CPAP; Therapy: (Recorded:13Aug2013) to 3. Topiramate 100 MG Oral Tablet; Therapy: (Recorded:08Aug2013) to 4. Topiramate 50 MG Oral Tablet; Therapy: 12Aug2013 to  Allergies Medication  1. No Known Drug Allergies  Family History Problems  1. Family history of  Hypertension V17.49 2. Family history of  Lung Cancer V16.1 Denied  3. Family history of  Kidney Cancer  Social History Problems    Alcohol Use rarely drinks alcohol   Marital History - Currently Married   Never A Smoker  Review of Systems Constitutional, skin, eye, otolaryngeal, hematologic/lymphatic, cardiovascular, pulmonary, endocrine, musculoskeletal, gastrointestinal, neurological and psychiatric system(s) were reviewed and pertinent findings if present are noted.  ENT: sinus problems.  Neurological: headache.    Vitals Vital Signs [Data Includes: Last 1 Day]  13Aug2013 11:17AM  BMI Calculated: 38.47 BSA Calculated: 2.22 Height: 5 ft 7.5 in Weight: 248 lb  Blood Pressure: 152 / 96 Heart Rate: 52  Physical Exam Constitutional: Well nourished and well developed . No acute distress.  ENT:. The ears and nose are normal in appearance.  Neck: The appearance of the neck is normal and no neck mass is present.  Pulmonary: No respiratory distress, normal respiratory rhythm and effort and clear bilateral breath sounds.  Cardiovascular: Heart rate and rhythm are normal . No peripheral edema.  Abdomen: The abdomen is obese. The abdomen is soft and nontender. No masses are palpated. No CVA tenderness. No hernias are palpable. No hepatosplenomegaly noted.  Lymphatics: The supraclavicular, femoral and inguinal nodes are not enlarged  or tender.  Skin: Normal skin turgor, no visible rash and no visible skin lesions.  Neuro/Psych:. Mood and affect are appropriate.    Results/Data Urine [Data Includes: Last 1 Day]   13Aug2013 COLOR YELLOW    APPEARANCE CLEAR  SPECIFIC GRAVITY 1.025  pH 5.5  GLUCOSE NEG mg/dL BILIRUBIN NEG  KETONE NEG mg/dL BLOOD TRACE  PROTEIN NEG mg/dL UROBILINOGEN 0.2 mg/dL NITRITE NEG  LEUKOCYTE ESTERASE NEG  SQUAMOUS EPITHELIAL/HPF FEW  WBC 0-2 WBC/hpf RBC 0-2 RBC/hpf BACTERIA RARE  CRYSTALS NONE SEEN  CASTS NONE SEEN    I have reviewed her CT scan from 10/13/11 with findings as dictated above. I have also reviewed her laboratory studies which would performed in 10/06/2022. This indicated normal liver function tests and alkaline phosphatase.   Plan Health Maintenance (V70.0)  1. UA With REFLEX  Done: 13Aug2013 11:02AM Renal Neoplasm (239.5)  2. CHEST X-RAY  Requested for: 13Aug2013 3. COMPREHENSIVE METABOLIC PANEL  Requested for: 13Aug2013 4. Follow-up Schedule Surgery Office  Follow-up  Requested for: 13Aug2013  Discussion/Summary  1. 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 Donell Beers regarding her pancreatic lesions and whether she may require surgery on her pancreas. Assuming she does not, I have recommended she proceed with a right robotic-assisted laparoscopic partial nephrectomy for treatment of her enhancing right renal mass.   The patient was provided information regarding their renal mass including the relative risk of benign versus malignant pathology and the natural history of renal cell carcinoma and other possible malignancies of the kidney. The role of renal biopsy, laboratory testing, and imaging studies to further characterize renal masses and/or the presence of metastatic disease were explained. We discussed the role of active surveillance, surgical therapy with both radical nephrectomy and nephron-sparing surgery, and ablative therapy in the treatment of renal masses. In addition, we discussed our goals of providing an accurate diagnosis and oncologic control while maintaining optimal renal function as appropriate  based on the size, location, and complexity of their renal mass as well as their co-morbidities.    We have discussed the risks of treatment in detail including but not limited to bleeding, infection, heart attack, stroke, death, venothromoboembolism, cancer recurrence, injury/damage to surrounding organs and structures, urine leak, the possibility of open surgical conversion for patients undergoing minimally invasive surgery, the risk of developing chronic kidney disease and its associated implications, and the potential risk of end stage renal disease possibly necessitating dialysis.   She will complete her evaluation including a chest x-ray and laboratory studies including assessment of her renal function. She will be tentatively scheduled for a right robotic-assisted laparoscopic partial nephrectomy pending my discussion with Dr. Donell Beers.  Cc: Dr. Rob Bunting Dr. Almond Lint    Verified Results COMPREHENSIVE METABOLIC PANEL1 13Aug2013 12:00PM1 Lyda Perone SPECIMEN TYPE: BLOOD [Dec 13, 2011 4:48AM Donny Heffern] Notify patient that all labs are normal.  Test Name Result Flag Reference GLUCOSE1 80 mg/dL1  70-991 BUN1 20 mg/dL1  6-231 CREATININE1 0.96 mg/dL1  0.50-1.401 SODIUM1 137 mEq/L1  [phone] POTASSIUM1 4.2 mEq/L1  3.5-5.31 CHLORIDE1 103 mEq/L1  96-1121 CO21 24 mEq/L1  19-321 CALCIUM1 9.5 mg/dL1  8.4-10.51 TOTAL PROTEIN1 7.5 g/dL1  6.0-8.31 ALBUMIN1 4.2 g/dL1  3.5-5.21 AST/SGOT1 19 U/L1  0-371 ALT/SGPT1 20 U/L1  0-351 ALKALINE PHOSPHATASE1 99 U/L1  39-1171 BILIRUBIN, TOTAL1 0.8 mg/dL1  0.3-1.21 Est GFR, African American1 86 mL/min1   Est GFR, NonAfrican American1 74 mL/min1   The estimated GFR  is a calculation valid for adults (38 to 40 years old) that uses the CKD-EPI algorithm to adjust for age and sex. It is not to be used for children, pregnant women, hospitalized patients, patients on dialysis, or with rapidly changing kidney function. According to the NKDEP,  eGFR >89 is normal, 60-89 shows mild impairment, 30-59 shows moderate impairment, 15-29 shows severe impairment and <15 is ESRD.    1. Amended By: Heloise Purpura; 12/13/2011 4:48 AMEST  SignaturesElectronically signed by : Heloise Purpura, M.D.; Dec 13 2011  4:48AM

## 2012-01-08 ENCOUNTER — Encounter (HOSPITAL_COMMUNITY): Payer: Self-pay | Admitting: Anesthesiology

## 2012-01-08 ENCOUNTER — Encounter (HOSPITAL_COMMUNITY): Payer: Self-pay | Admitting: *Deleted

## 2012-01-08 ENCOUNTER — Ambulatory Visit (HOSPITAL_COMMUNITY): Payer: BC Managed Care – PPO | Admitting: Anesthesiology

## 2012-01-08 ENCOUNTER — Encounter (HOSPITAL_COMMUNITY)
Admission: RE | Disposition: A | Discharge status: Home / Self Care | Payer: Self-pay | Source: Ambulatory Visit | Attending: Urology

## 2012-01-08 ENCOUNTER — Inpatient Hospital Stay (HOSPITAL_COMMUNITY)
Admission: RE | Admit: 2012-01-08 | Discharge: 2012-01-10 | DRG: 303 | Disposition: A | Discharge status: Home / Self Care | Payer: BC Managed Care – PPO | Source: Ambulatory Visit | Attending: Urology | Admitting: Urology

## 2012-01-08 DIAGNOSIS — I10 Essential (primary) hypertension: Secondary | ICD-10-CM | POA: Diagnosis present

## 2012-01-08 DIAGNOSIS — K869 Disease of pancreas, unspecified: Secondary | ICD-10-CM | POA: Diagnosis present

## 2012-01-08 DIAGNOSIS — G4730 Sleep apnea, unspecified: Secondary | ICD-10-CM | POA: Diagnosis present

## 2012-01-08 DIAGNOSIS — D4959 Neoplasm of unspecified behavior of other genitourinary organ: Principal | ICD-10-CM | POA: Diagnosis present

## 2012-01-08 DIAGNOSIS — Z79899 Other long term (current) drug therapy: Secondary | ICD-10-CM

## 2012-01-08 LAB — HEMOGLOBIN AND HEMATOCRIT, BLOOD: HCT: 42.2 % (ref 36.0–46.0)

## 2012-01-08 LAB — TYPE AND SCREEN
ABO/RH(D): O POS
Antibody Screen: NEGATIVE

## 2012-01-08 LAB — BASIC METABOLIC PANEL
CO2: 23 mEq/L (ref 19–32)
Calcium: 9 mg/dL (ref 8.4–10.5)
Chloride: 99 mEq/L (ref 96–112)
Glucose, Bld: 134 mg/dL — ABNORMAL HIGH (ref 70–99)
Sodium: 134 mEq/L — ABNORMAL LOW (ref 135–145)

## 2012-01-08 SURGERY — ROBOTIC ASSITED PARTIAL NEPHRECTOMY
Anesthesia: General | Laterality: Right | Wound class: Clean Contaminated

## 2012-01-08 MED ORDER — ACETAMINOPHEN 10 MG/ML IV SOLN
1000.0000 mg | Freq: Four times a day (QID) | INTRAVENOUS | Status: AC
  Administered 2012-01-08 – 2012-01-09 (×4): 1000 mg via INTRAVENOUS
  Filled 2012-01-08 (×4): qty 100

## 2012-01-08 MED ORDER — GLYCOPYRROLATE 0.2 MG/ML IJ SOLN
INTRAMUSCULAR | Status: DC | PRN
  Administered 2012-01-08: .6 mg via INTRAVENOUS

## 2012-01-08 MED ORDER — ONDANSETRON HCL 4 MG/2ML IJ SOLN
INTRAMUSCULAR | Status: DC | PRN
  Administered 2012-01-08: 4 mg via INTRAVENOUS

## 2012-01-08 MED ORDER — LABETALOL HCL 5 MG/ML IV SOLN
INTRAVENOUS | Status: DC | PRN
  Administered 2012-01-08: 2.5 mg via INTRAVENOUS

## 2012-01-08 MED ORDER — PROPOFOL 10 MG/ML IV BOLUS
INTRAVENOUS | Status: DC | PRN
  Administered 2012-01-08: 200 mg via INTRAVENOUS

## 2012-01-08 MED ORDER — HYDROMORPHONE HCL PF 1 MG/ML IJ SOLN
0.2500 mg | INTRAMUSCULAR | Status: DC | PRN
  Administered 2012-01-08 (×4): 0.5 mg via INTRAVENOUS

## 2012-01-08 MED ORDER — CEFAZOLIN SODIUM-DEXTROSE 2-3 GM-% IV SOLR
INTRAVENOUS | Status: AC
  Filled 2012-01-08: qty 50

## 2012-01-08 MED ORDER — MIDAZOLAM HCL 5 MG/5ML IJ SOLN
INTRAMUSCULAR | Status: DC | PRN
  Administered 2012-01-08: 2 mg via INTRAVENOUS

## 2012-01-08 MED ORDER — DEXTROSE-NACL 5-0.45 % IV SOLN
INTRAVENOUS | Status: DC
  Administered 2012-01-08 – 2012-01-09 (×4): via INTRAVENOUS

## 2012-01-08 MED ORDER — ZOLPIDEM TARTRATE 5 MG PO TABS
5.0000 mg | ORAL_TABLET | Freq: Every evening | ORAL | Status: DC | PRN
  Administered 2012-01-09: 5 mg via ORAL
  Filled 2012-01-08: qty 1

## 2012-01-08 MED ORDER — MORPHINE SULFATE 2 MG/ML IJ SOLN: 2.0000 mg | INTRAMUSCULAR | Status: DC | PRN

## 2012-01-08 MED ORDER — CEFAZOLIN SODIUM-DEXTROSE 2-3 GM-% IV SOLR
2.0000 g | INTRAVENOUS | Status: AC
  Administered 2012-01-08: 2 g via INTRAVENOUS

## 2012-01-08 MED ORDER — OXYCODONE HCL 5 MG PO TABS: 5.0000 mg | ORAL_TABLET | Freq: Once | ORAL | Status: DC | PRN

## 2012-01-08 MED ORDER — DIPHENHYDRAMINE HCL 50 MG/ML IJ SOLN: 12.5000 mg | Freq: Four times a day (QID) | INTRAMUSCULAR | Status: DC | PRN

## 2012-01-08 MED ORDER — LACTATED RINGERS IR SOLN
Status: DC | PRN
  Administered 2012-01-08: 1000 mL

## 2012-01-08 MED ORDER — DOCUSATE SODIUM 100 MG PO CAPS
100.0000 mg | ORAL_CAPSULE | Freq: Two times a day (BID) | ORAL | Status: DC
  Administered 2012-01-08 – 2012-01-10 (×5): 100 mg via ORAL
  Filled 2012-01-08 (×6): qty 1

## 2012-01-08 MED ORDER — LACTATED RINGERS IV SOLN
INTRAVENOUS | Status: DC | PRN
  Administered 2012-01-08 (×2): via INTRAVENOUS

## 2012-01-08 MED ORDER — MANNITOL 25 % IV SOLN
25.0000 g | Freq: Once | INTRAVENOUS | Status: AC
  Administered 2012-01-08 (×2): 12.5 g via INTRAVENOUS
  Filled 2012-01-08: qty 100

## 2012-01-08 MED ORDER — STERILE WATER FOR IRRIGATION IR SOLN
Status: DC | PRN
  Administered 2012-01-08: 3000 mL

## 2012-01-08 MED ORDER — SUCCINYLCHOLINE CHLORIDE 20 MG/ML IJ SOLN
INTRAMUSCULAR | Status: DC | PRN
  Administered 2012-01-08: 100 mg via INTRAVENOUS

## 2012-01-08 MED ORDER — DEXAMETHASONE SODIUM PHOSPHATE 10 MG/ML IJ SOLN
INTRAMUSCULAR | Status: DC | PRN
  Administered 2012-01-08: 10 mg via INTRAVENOUS

## 2012-01-08 MED ORDER — PROMETHAZINE HCL 25 MG/ML IJ SOLN: 6.2500 mg | INTRAMUSCULAR | Status: DC | PRN

## 2012-01-08 MED ORDER — ACETAMINOPHEN 10 MG/ML IV SOLN: 1000.0000 mg | Freq: Once | INTRAVENOUS | Status: DC | PRN

## 2012-01-08 MED ORDER — CEFAZOLIN SODIUM 1-5 GM-% IV SOLN
1.0000 g | Freq: Three times a day (TID) | INTRAVENOUS | Status: AC
  Administered 2012-01-08 (×2): 1 g via INTRAVENOUS
  Filled 2012-01-08 (×2): qty 50

## 2012-01-08 MED ORDER — LIDOCAINE HCL (CARDIAC) 20 MG/ML IV SOLN
INTRAVENOUS | Status: DC | PRN
  Administered 2012-01-08: 100 mg via INTRAVENOUS

## 2012-01-08 MED ORDER — DIPHENHYDRAMINE HCL 12.5 MG/5ML PO ELIX: 12.5000 mg | ORAL_SOLUTION | Freq: Four times a day (QID) | ORAL | Status: DC | PRN

## 2012-01-08 MED ORDER — CISATRACURIUM BESYLATE (PF) 10 MG/5ML IV SOLN
INTRAVENOUS | Status: DC | PRN
  Administered 2012-01-08: 2 mg via INTRAVENOUS
  Administered 2012-01-08: 4 mg via INTRAVENOUS
  Administered 2012-01-08: 12 mg via INTRAVENOUS

## 2012-01-08 MED ORDER — OXYCODONE HCL 5 MG/5ML PO SOLN
5.0000 mg | Freq: Once | ORAL | Status: DC | PRN
  Filled 2012-01-08: qty 5

## 2012-01-08 MED ORDER — HYDROMORPHONE HCL PF 1 MG/ML IJ SOLN
INTRAMUSCULAR | Status: AC
  Filled 2012-01-08: qty 1

## 2012-01-08 MED ORDER — ACETAMINOPHEN 10 MG/ML IV SOLN
INTRAVENOUS | Status: DC | PRN
  Administered 2012-01-08: 1000 mg via INTRAVENOUS

## 2012-01-08 MED ORDER — ONDANSETRON HCL 4 MG/2ML IJ SOLN: 4.0000 mg | INTRAMUSCULAR | Status: DC | PRN

## 2012-01-08 MED ORDER — LACTATED RINGERS IV SOLN: INTRAVENOUS | Status: DC

## 2012-01-08 MED ORDER — INDOCYANINE GREEN 25 MG IV SOLR
INTRAVENOUS | Status: DC | PRN
  Administered 2012-01-08: 7.5 mg via INTRAVENOUS

## 2012-01-08 MED ORDER — SUFENTANIL CITRATE 50 MCG/ML IV SOLN
INTRAVENOUS | Status: DC | PRN
  Administered 2012-01-08 (×2): 20 ug via INTRAVENOUS
  Administered 2012-01-08 (×2): 10 ug via INTRAVENOUS
  Administered 2012-01-08 (×2): 5 ug via INTRAVENOUS
  Administered 2012-01-08: 10 ug via INTRAVENOUS

## 2012-01-08 MED ORDER — TOPIRAMATE 25 MG PO TABS
150.0000 mg | ORAL_TABLET | Freq: Every evening | ORAL | Status: DC
  Administered 2012-01-08 – 2012-01-09 (×2): 150 mg via ORAL
  Filled 2012-01-08 (×3): qty 2

## 2012-01-08 MED ORDER — ACETAMINOPHEN 10 MG/ML IV SOLN
INTRAVENOUS | Status: AC
  Filled 2012-01-08: qty 100

## 2012-01-08 MED ORDER — MEPERIDINE HCL 50 MG/ML IJ SOLN
6.2500 mg | INTRAMUSCULAR | Status: DC | PRN
Start: 2012-01-08 — End: 2012-01-08

## 2012-01-08 MED ORDER — NEOSTIGMINE METHYLSULFATE 1 MG/ML IJ SOLN
INTRAMUSCULAR | Status: DC | PRN
  Administered 2012-01-08: 4 mg via INTRAVENOUS

## 2012-01-08 MED ORDER — CITALOPRAM HYDROBROMIDE 20 MG PO TABS
20.0000 mg | ORAL_TABLET | Freq: Every day | ORAL | Status: DC
  Administered 2012-01-09 – 2012-01-10 (×2): 20 mg via ORAL
  Filled 2012-01-08 (×2): qty 1

## 2012-01-08 MED ORDER — BUPIVACAINE LIPOSOME 1.3 % IJ SUSP
20.0000 mL | Freq: Once | INTRAMUSCULAR | Status: AC
Start: 2012-01-08 — End: 2012-01-08
  Administered 2012-01-08: 20 mL
  Filled 2012-01-08: qty 20

## 2012-01-08 SURGICAL SUPPLY — 61 items
APPLICATOR SURGIFLO ENDO (HEMOSTASIS) ×2 IMPLANT
CANNULA SEAL DVNC (CANNULA) IMPLANT
CANNULA SEALS DA VINCI (CANNULA)
CHLORAPREP W/TINT 26ML (MISCELLANEOUS) ×2 IMPLANT
CLIP LIGATING HEM O LOK PURPLE (MISCELLANEOUS) ×2 IMPLANT
CLIP LIGATING HEMO O LOK GREEN (MISCELLANEOUS) ×4 IMPLANT
CLOTH BEACON ORANGE TIMEOUT ST (SAFETY) ×2 IMPLANT
CORD HIGH FREQUENCY UNIPOLAR (ELECTROSURGICAL) ×2 IMPLANT
CORDS BIPOLAR (ELECTRODE) ×2 IMPLANT
COVER SURGICAL LIGHT HANDLE (MISCELLANEOUS) ×2 IMPLANT
COVER TIP SHEARS 8 DVNC (MISCELLANEOUS) ×1 IMPLANT
COVER TIP SHEARS 8MM DA VINCI (MISCELLANEOUS) ×1
DECANTER SPIKE VIAL GLASS SM (MISCELLANEOUS) IMPLANT
DERMABOND ADVANCED (GAUZE/BANDAGES/DRESSINGS) ×1
DERMABOND ADVANCED .7 DNX12 (GAUZE/BANDAGES/DRESSINGS) ×1 IMPLANT
DRAIN CHANNEL 15F RND FF 3/16 (WOUND CARE) ×2 IMPLANT
DRAPE INCISE IOBAN 66X45 STRL (DRAPES) ×2 IMPLANT
DRAPE LAPAROSCOPIC ABDOMINAL (DRAPES) ×2 IMPLANT
DRAPE LG THREE QUARTER DISP (DRAPES) ×4 IMPLANT
DRAPE TABLE BACK 44X90 PK DISP (DRAPES) ×2 IMPLANT
DRAPE WARM FLUID 44X44 (DRAPE) ×2 IMPLANT
DRESSING SURGICEL FIBRLLR 1X2 (HEMOSTASIS) IMPLANT
DRSG SURGICEL FIBRILLAR 1X2 (HEMOSTASIS)
ELECT REM PT RETURN 9FT ADLT (ELECTROSURGICAL) ×4
ELECTRODE REM PT RTRN 9FT ADLT (ELECTROSURGICAL) ×2 IMPLANT
EVACUATOR SILICONE 100CC (DRAIN) ×2 IMPLANT
GAUZE VASELINE 3X9 (GAUZE/BANDAGES/DRESSINGS) IMPLANT
GLOVE BIO SURGEON STRL SZ 6.5 (GLOVE) ×2 IMPLANT
GLOVE BIOGEL M STRL SZ7.5 (GLOVE) ×4 IMPLANT
GOWN STRL NON-REIN LRG LVL3 (GOWN DISPOSABLE) ×4 IMPLANT
GOWN STRL REIN XL XLG (GOWN DISPOSABLE) ×4 IMPLANT
HEMOSTAT SURGICEL 4X8 (HEMOSTASIS) IMPLANT
KIT ACCESSORY DA VINCI DISP (KITS) ×1
KIT ACCESSORY DVNC DISP (KITS) ×1 IMPLANT
KIT BASIN OR (CUSTOM PROCEDURE TRAY) ×2 IMPLANT
NS IRRIG 1000ML POUR BTL (IV SOLUTION) IMPLANT
PENCIL BUTTON HOLSTER BLD 10FT (ELECTRODE) ×2 IMPLANT
POSITIONER SURGICAL ARM (MISCELLANEOUS) ×4 IMPLANT
POUCH SPECIMEN RETRIEVAL 10MM (ENDOMECHANICALS) ×2 IMPLANT
SET TUBE IRRIG SUCTION NO TIP (IRRIGATION / IRRIGATOR) ×2 IMPLANT
SOLUTION ANTI FOG 6CC (MISCELLANEOUS) ×2 IMPLANT
SOLUTION ELECTROLUBE (MISCELLANEOUS) ×2 IMPLANT
SPONGE LAP 18X18 X RAY DECT (DISPOSABLE) IMPLANT
SURGIFLO W/THROMBIN 8M KIT (HEMOSTASIS) ×2 IMPLANT
SUT ETHILON 3 0 PS 1 (SUTURE) ×2 IMPLANT
SUT MNCRL AB 4-0 PS2 18 (SUTURE) ×4 IMPLANT
SUT V-LOC BARB 180 2/0GR6 GS22 (SUTURE) ×4
SUT V-LOC BARB 180 2/0GR9 GS23 (SUTURE) ×2
SUT VIC AB 0 CT1 27 (SUTURE) ×1
SUT VIC AB 0 CT1 27XBRD ANTBC (SUTURE) ×1 IMPLANT
SUT VICRYL 0 UR6 27IN ABS (SUTURE) ×2 IMPLANT
SUTURE V-LC BRB 180 2/0GR6GS22 (SUTURE) ×2 IMPLANT
SUTURE V-LC BRB 180 2/0GR9GS23 (SUTURE) ×1 IMPLANT
SYR BULB IRRIGATION 50ML (SYRINGE) IMPLANT
TOWEL OR NON WOVEN STRL DISP B (DISPOSABLE) ×4 IMPLANT
TRAY FOLEY CATH 14FRSI W/METER (CATHETERS) ×2 IMPLANT
TRAY LAP CHOLE (CUSTOM PROCEDURE TRAY) ×2 IMPLANT
TROCAR ENDOPATH XCEL 12X100 BL (ENDOMECHANICALS) ×2 IMPLANT
TROCAR XCEL 12X100 BLDLESS (ENDOMECHANICALS) ×2 IMPLANT
TUBING INSUFFLATION 10FT LAP (TUBING) ×2 IMPLANT
WATER STERILE IRR 1500ML POUR (IV SOLUTION) ×4 IMPLANT

## 2012-01-08 NOTE — Anesthesia Postprocedure Evaluation (Signed)
Anesthesia Post Note  Patient: Krystal Reynolds  Procedure(s) Performed: Procedure(s) (LRB): ROBOTIC ASSITED PARTIAL NEPHRECTOMY (Right)  Anesthesia type: General  Patient location: PACU  Post pain: Pain level controlled  Post assessment: Post-op Vital signs reviewed  Last Vitals: BP 172/88  Pulse 82  Temp 36.8 C (Oral)  Resp 15  SpO2 100%  Post vital signs: Reviewed  Level of consciousness: sedated  Complications: No apparent anesthesia complications

## 2012-01-08 NOTE — Progress Notes (Signed)
Patient ID: Krystal Reynolds, female   DOB: 1970/11/14, 41 y.o.   MRN: [id number]  Post-op note  Subjective: The patient is doing well.  No complaints.  Objective: Vital signs in last 24 hours: Temp:  [97.6 F (36.4 C)-98.3 F (36.8 C)] 97.9 F (36.6 C) (09/09 1602) Pulse Rate:  [58-82] 58  (09/09 1602) Resp:  [15-18] 16  (09/09 1602) BP: (123-172)/(76-90) 123/76 mmHg (09/09 1602) SpO2:  [97 %-100 %] 98 % (09/09 1602) Weight:  [110.6 kg (243 lb 13.3 oz)-110.678 kg (244 lb)] 110.6 kg (243 lb 13.3 oz) (09/09 1602)  Intake/Output from previous day:   Intake/Output this shift: Total I/O In: 2360 [P.O.:360; I.V.:2000] Out: 635 [Urine:355; Drains:80; Blood:200]  Physical Exam:  General: Alert and oriented. Abdomen: Soft, Nondistended. Incisions: Clean and dry.  Lab Results:  Basename 01/08/12 1059  HGB 14.8  HCT 42.2    Assessment/Plan: POD#0   1) Continue to monitor   Moody Bruins. MD   LOS: 0 days   Deval Mroczka,LES 01/08/2012, 6:21 PM

## 2012-01-08 NOTE — Anesthesia Preprocedure Evaluation (Addendum)
Anesthesia Evaluation  Patient identified by MRN, date of birth, ID band Patient awake    Reviewed: Allergy & Precautions, H&P , NPO status , Patient's Chart, lab work & pertinent test results  Airway Mallampati: II TM Distance: >3 FB Neck ROM: Full    Dental  (+) Dental Advisory Given and Teeth Intact   Pulmonary sleep apnea and Continuous Positive Airway Pressure Ventilation ,  breath sounds clear to auscultation  Pulmonary exam normal       Cardiovascular - CAD, - Past MI and - DVT Rhythm:Regular Rate:Normal     Neuro/Psych  Headaches, negative psych ROS   GI/Hepatic negative GI ROS, Neg liver ROS,   Endo/Other  negative endocrine ROS  Renal/GU Renal diseaseRenal mass     Musculoskeletal negative musculoskeletal ROS (+)   Abdominal (+) + obese,   Peds  Hematology negative hematology ROS (+)   Anesthesia Other Findings   Reproductive/Obstetrics                         Anesthesia Physical Anesthesia Plan  ASA: II  Anesthesia Plan: General   Post-op Pain Management:    Induction: Intravenous  Airway Management Planned: Oral ETT  Additional Equipment:   Intra-op Plan:   Post-operative Plan: Extubation in OR  Informed Consent: I have reviewed the patients History and Physical, chart, labs and discussed the procedure including the risks, benefits and alternatives for the proposed anesthesia with the patient or authorized representative who has indicated his/her understanding and acceptance.   Dental advisory given  Plan Discussed with: CRNA and Surgeon  Anesthesia Plan Comments:        Anesthesia Quick Evaluation

## 2012-01-08 NOTE — Transfer of Care (Signed)
Immediate Anesthesia Transfer of Care Note  Patient: Krystal Reynolds  Procedure(s) Performed: Procedure(s) (LRB) with comments: ROBOTIC ASSITED PARTIAL NEPHRECTOMY (Right)  Patient Location: PACU  Anesthesia Type: General  Level of Consciousness: awake and oriented  Airway & Oxygen Therapy: Patient Spontanous Breathing and Patient connected to face mask oxygen  Post-op Assessment: Report given to PACU RN and Post -op Vital signs reviewed and stable  Post vital signs: Reviewed and stable  Complications: No apparent anesthesia complications

## 2012-01-08 NOTE — Anesthesia Procedure Notes (Signed)
Procedure Name: Intubation Date/Time: 01/08/2012 7:32 AM Performed by: Leroy Libman L Patient Re-evaluated:Patient Re-evaluated prior to inductionOxygen Delivery Method: Circle system utilized Preoxygenation: Pre-oxygenation with 100% oxygen Intubation Type: IV induction Ventilation: Mask ventilation without difficulty and Oral airway inserted - appropriate to patient size Laryngoscope Size: Hyacinth Meeker and 2 Grade View: Grade I Tube type: Oral Tube size: 7.5 mm Number of attempts: 1 Airway Equipment and Method: Stylet Placement Confirmation: ETT inserted through vocal cords under direct vision,  breath sounds checked- equal and bilateral and positive ETCO2 Secured at: 22 cm Tube secured with: Tape Dental Injury: Teeth and Oropharynx as per pre-operative assessment

## 2012-01-08 NOTE — Preoperative (Signed)
Beta Blockers   Reason not to administer Beta Blockers:Not Applicable 

## 2012-01-08 NOTE — Interval H&P Note (Signed)
History and Physical Interval Note:  01/08/2012 7:20 AM  Krystal Reynolds  has presented today for surgery, with the diagnosis of RIGHT RENAL NEOPLASM  The various methods of treatment have been discussed with the patient and family. After consideration of risks, benefits and other options for treatment, the patient has consented to  Procedure(s) (LRB) with comments: ROBOTIC ASSITED PARTIAL NEPHRECTOMY (Right) as a surgical intervention .  The patient's history has been reviewed, patient examined, no change in status, stable for surgery.  I have reviewed the patient's chart and labs.  I have spoke with Dr. Donell Beers and her pancreatic lesions do not require surgical intervention. Will proceed with treatment of her right renal mass. Questions were answered to the patient's satisfaction.     Tristian Bouska,LES

## 2012-01-08 NOTE — Op Note (Signed)
Preoperative diagnosis: Right renal neoplasm  Postoperative diagnosis: RIght renal neoplasm  Procedure:  1. Right robotic-assisted laparoscopic partial nephrectomy  Surgeon: Moody Bruins. M.D.  Assistant(s): Pecola Leisure, PA-C  Anesthesia: General  Complications: None  EBL: 200 mL  IVF:  2000 mL crystalloid  Specimens: 1. Right renal neoplasm  Disposition of specimens: Pathology  Intraoperative findings:       1. Segmental warm renal ischemia time: 23 minutes     Drains: 1. # 15 Blake perinephric drain  Indication:  Krystal Reynolds is a 41 y.o. year old patient with a right renal neoplasm.  After a thorough review of the management options for their renal mass, they elected to proceed with surgical treatment and the above procedure.  We have discussed the potential benefits and risks of the procedure, side effects of the proposed treatment, the likelihood of the patient achieving the goals of the procedure, and any potential problems that might occur during the procedure or recuperation. Informed consent has been obtained.   Description of procedure:  The patient was taken to the operating room and a general anesthetic was administered. The patient was given preoperative antibiotics, placed in the right modified flank position with care to pad all potential pressure points, and prepped and draped in the usual sterile fashion. Next a preoperative timeout was performed.  A site was selected on the right side of the umbilicus for placement of the camera port. This was placed using a standard open Hassan technique which allowed entry into the peritoneal cavity under direct vision and without difficulty. A 12 mm port was placed and a pneumoperitoneum established. The camera was then used to inspect the abdomen and there was no evidence of any intra-abdominal injuries or other abnormalities. The remaining abdominal ports were then placed. 8 mm robotic ports were placed  in the right upper quadrant, right lower quadrant, and far right lateral abdominal wall. A 12 mm port was placed in the upper midline for laparoscopic assistance. All ports were placed under direct vision without difficulty. The surgical cart was then docked.   Utilizing the cautery scissors, the white line of Toldt was incised allowing the colon to be mobilized medially and the plane between the mesocolon and the anterior layer of Gerota's fascia to be developed and the kidney to be exposed.  The ureter and gonadal vein were identified inferiorly and the ureter was lifted anteriorly off the psoas muscle.  Dissection proceeded superiorly along the gonadal vein until the renal vein was identified.  The renal hilum was then carefully isolated with a combination of blunt and sharp dissection allowing the renal arterial and venous structures to be separated and isolated in preparation for renal hilar vessel clamping. There was a single branching renal artery with a posterior and anterior segmental artery noted.  There also was noted to be a branch of the anterior artery extending into the lower pole which was able to be isolated.  12.5 g of IV mannitol was then administered.   Attention turned to the kidney and the perinephric fat surrounding the renal mass was removed and the kidney was mobilized sufficiently for exposure and resection of the renal mass.  The mass was exophytic and located off the posterior lower pole of the right kidney.  Once the renal mass was properly isolated, preparations were made for resection of the tumor.  Reconstructive sutures were placed into the abdomen for the renorrhaphy portion of the procedure.  The posterior segmental artery and  the lower ple anterior arteries were then clamped with bulldog clamps. Indocyanine green dye was administered intravenously confirming perfusion of the upper pole of the kidney but ischemia to the lower pole including the renal tumor.  The tumor was  then excised with cold scissor dissection along with an adequate visible gross margin of normal renal parenchyma. The tumor appeared to be excised without any gross violation of the tumor. The renal collecting system was entered during removal of the tumor.  A running 3-0 V-lock suture was then brought through the capsule of the kidney and run along the base of the renal defect to provide hemostasis and close any entry into the renal collecting system if present. Weck clips were used to secure this suture outside the renal capsule at the proximal and distal ends. Surgiflo was then placed into the renal defect and the renal parenchyma was closed with a 2-0 V-lock horizontal mattress capsular suture which resulted in excellent compression of the renal defect.    The bulldog clamps were then removed from the renal hilar vessel(s) and an additional 12.5 g of IV mannitol was administered. Total segmental warm renal ischemia time was 23 minutes. The renal tumor resection site was examined. Hemostasis appeared adequate.   The kidney was placed back into its normal anatomic position and covered with perinephric fat as needed.  A # 15 Blake drain was then brought through the lateral lower port site and positioned in the perinephric space.  It was secured to the skin with a nylon suture. The surgical cart was undocked.  The renal tumor specimen was removed intact within an endopouch retrieval bag via the camera port sites.  The camera port site and the other 12 mm port site were then closed at the fascial level with 0-vicryl suture.  All other laparoscopic/robotic ports were removed under direct vision and the pneumoperitoneum let down with inspection of the operative field performed and hemostasis again confirmed. All incision sites were then injected with local anesthetic and reapproximated at the skin level with 4-0 monocryl subcuticular closures.  Dermabond was applied to the skin.  The patient tolerated the procedure  well and without complications.  The patient was able to be extubated and transferred to the recovery unit in satisfactory condition.  Moody Bruins MD

## 2012-01-09 LAB — BASIC METABOLIC PANEL
BUN: 7 mg/dL (ref 6–23)
Chloride: 106 mEq/L (ref 96–112)
Creatinine, Ser: 0.96 mg/dL (ref 0.50–1.10)
GFR calc non Af Amer: 73 mL/min — ABNORMAL LOW (ref >90)
Glucose, Bld: 136 mg/dL — ABNORMAL HIGH (ref 70–99)
Potassium: 3.9 mEq/L (ref 3.5–5.1)

## 2012-01-09 MED ORDER — BISACODYL 10 MG RE SUPP
10.0000 mg | Freq: Once | RECTAL | Status: AC
  Administered 2012-01-09: 10 mg via RECTAL
  Filled 2012-01-09: qty 1

## 2012-01-09 MED ORDER — ACETAMINOPHEN 325 MG PO TABS
650.0000 mg | ORAL_TABLET | Freq: Four times a day (QID) | ORAL | Status: DC | PRN
  Administered 2012-01-09: 650 mg via ORAL
  Filled 2012-01-09: qty 2

## 2012-01-09 MED ORDER — HYDROCODONE-ACETAMINOPHEN 5-325 MG PO TABS
1.0000 | ORAL_TABLET | Freq: Four times a day (QID) | ORAL | Status: DC | PRN
  Administered 2012-01-09: 2 via ORAL
  Filled 2012-01-09: qty 2

## 2012-01-09 NOTE — Progress Notes (Signed)
Patient ID: Krystal Reynolds, female   DOB: [birth year], 41 y.o.   MRN: [id number]  1 Day Post-Op Subjective: The patient is doing well.  No nausea or vomiting. Tolerating clears. Pain is adequately controlled.  Objective: Vital signs in last 24 hours: Temp:  [97.6 F (36.4 C)-98.3 F (36.8 C)] 97.7 F (36.5 C) (09/10 0539) Pulse Rate:  [51-82] 51  (09/10 0539) Resp:  [15-16] 16  (09/10 0539) BP: (123-172)/(71-90) 144/71 mmHg (09/10 0539) SpO2:  [96 %-100 %] 97 % (09/10 0539) Weight:  [110.6 kg (243 lb 13.3 oz)-110.678 kg (244 lb)] 110.6 kg (243 lb 13.3 oz) (09/09 1602)  Intake/Output from previous day: 09/09 0701 - 09/10 0700 In: 6152.5 [P.O.:840; I.V.:4862.5; IV Piggyback:450] Out: 4272 [Urine:3955; Drains:117; Blood:200] Intake/Output this shift:    Physical Exam:  General: Alert and oriented. CV: RRR Lungs: Clear bilaterally. GI: Soft, Nondistended. Positive bowel sounds. Incisions: Clean and dry. Urine: Clear Extremities: Nontender, no erythema, no edema.  Lab Results:  Basename 01/09/12 0530 01/08/12 1059  HGB 13.2 14.8  HCT 37.5 42.2          Basename 01/09/12 0530 01/08/12 1059 01/02/12 1400  CREATININE 0.96 1.06 0.90           Results for orders placed during the hospital encounter of 01/08/12 (from the past 24 hour(s))  BASIC METABOLIC PANEL     Status: Abnormal   Collection Time   01/08/12 10:59 AM      Component Value Range   Sodium 134 (*) 135 - 145 mEq/L   Potassium 3.9  3.5 - 5.1 mEq/L   Chloride 99  96 - 112 mEq/L   CO2 23  19 - 32 mEq/L   Glucose, Bld 134 (*) 70 - 99 mg/dL   BUN 13  6 - 23 mg/dL   Creatinine, Ser 4.09  0.50 - 1.10 mg/dL   Calcium 9.0  8.4 - 81.1 mg/dL   GFR calc non Af Amer 65 (*) >90 mL/min   GFR calc Af Amer 75 (*) >90 mL/min  HEMOGLOBIN AND HEMATOCRIT, BLOOD     Status: Normal   Collection Time   01/08/12 10:59 AM      Component Value Range   Hemoglobin 14.8  12.0 - 15.0 g/dL   HCT 91.4  78.2 - 95.6 %  BASIC METABOLIC  PANEL     Status: Abnormal   Collection Time   01/09/12  5:30 AM      Component Value Range   Sodium 137  135 - 145 mEq/L   Potassium 3.9  3.5 - 5.1 mEq/L   Chloride 106  96 - 112 mEq/L   CO2 22  19 - 32 mEq/L   Glucose, Bld 136 (*) 70 - 99 mg/dL   BUN 7  6 - 23 mg/dL   Creatinine, Ser 2.13  0.50 - 1.10 mg/dL   Calcium 8.8  8.4 - 08.6 mg/dL   GFR calc non Af Amer 73 (*) >90 mL/min   GFR calc Af Amer 85 (*) >90 mL/min  HEMOGLOBIN AND HEMATOCRIT, BLOOD     Status: Normal   Collection Time   01/09/12  5:30 AM      Component Value Range   Hemoglobin 13.2  12.0 - 15.0 g/dL   HCT 57.8  46.9 - 62.9 %    Assessment/Plan: POD# 1 s/p robotic partial nephrectomy.  1) Ambulate, Incentive spirometry 2) Advance diet as tolerated 3) Transition to oral pain medication 4) Dulcolax suppository 5) D/C urethral  catheter   Krystal Reynolds. MD   LOS: 1 day   Krystal Reynolds,LES 01/09/2012, 7:40 AM

## 2012-01-10 LAB — BASIC METABOLIC PANEL
BUN: 9 mg/dL (ref 6–23)
CO2: 25 mEq/L (ref 19–32)
Glucose, Bld: 94 mg/dL (ref 70–99)
Potassium: 3.5 mEq/L (ref 3.5–5.1)
Sodium: 135 mEq/L (ref 135–145)

## 2012-01-10 LAB — CREATININE, FLUID (PLEURAL, PERITONEAL, JP DRAINAGE)

## 2012-01-10 MED ORDER — HYDROCODONE-ACETAMINOPHEN 5-325 MG PO TABS: 1.0000 | ORAL_TABLET | Freq: Four times a day (QID) | ORAL | Status: AC | PRN

## 2012-01-10 MED ORDER — DSS 100 MG PO CAPS: 100.0000 mg | ORAL_CAPSULE | Freq: Two times a day (BID) | ORAL | Status: AC

## 2012-01-10 NOTE — Progress Notes (Signed)
Notified Dr. Peggyann Shoals nurse about creatinine lab results; awaiting discharge instructions

## 2012-01-10 NOTE — Progress Notes (Signed)
Patient ID: Krystal Reynolds, female   DOB: 1971-02-23, 41 y.o.   MRN: [id number]  2 Days Post-Op Subjective: No complaints.  Passing flatus. No nausea or vomiting.  Tolerating regular diet.  Objective: Vital signs in last 24 hours: Temp:  [98.6 F (37 C)-99.1 F (37.3 C)] 98.6 F (37 C) (09/11 0517) Pulse Rate:  [62-74] 74  (09/11 0517) Resp:  [16] 16  (09/11 0517) BP: (125-143)/(81-87) 134/82 mmHg (09/11 0517) SpO2:  [97 %-98 %] 97 % (09/11 0517)  Intake/Output from previous day: 09/10 0701 - 09/11 0700 In: 730 [P.O.:720] Out: 1300 [Urine:1300] Intake/Output this shift: Total I/O In: -  Out: 10 [Drains:10]  Physical Exam:  General: Alert and oriented CV: RRR Lungs: Clear Abdomen: Soft, ND Incisions: C/D/I Ext: NT, No erythema  Lab Results:  Basename 01/09/12 0530 01/08/12 1059  HGB 13.2 14.8  HCT 37.5 42.2   BMET  Basename 01/10/12 0447 01/09/12 0530  NA 135 137  K 3.5 3.9  CL 103 106  CO2 25 22  GLUCOSE 94 136*  BUN 9 7  CREATININE 1.09 0.96  CALCIUM 8.8 8.8     Studies/Results: Path pending.  Assessment/Plan: - SL IVF - Check drain creatinine - Plan for discharge home later today   LOS: 2 days   Nema Oatley,LES 01/10/2012, 8:06 AM

## 2012-01-10 NOTE — Progress Notes (Signed)
JP drain removed without any problems; care instructions given to drain site; pt and husband stated understanding and denied questions/concerns

## 2012-01-10 NOTE — Progress Notes (Signed)
Pt discharged home via family; Pt and family given and explained all discharge instructions, carenotes, and prescriptions; pt and family stated understanding and denied questions/concerns; all f/u appointments in place; IV removed without complicaitons; pt stable at time of discharge  

## 2012-01-10 NOTE — Progress Notes (Signed)
Utilization review completed.  

## 2012-01-10 NOTE — Discharge Summary (Signed)
Date of admission: 01/08/2012  Date of discharge: 01/10/2012  Admission diagnosis: Right renal neoplasm  Discharge diagnosis: same; path pending  Secondary diagnoses: pancreatic cystic mass, OSA, anemia, HTN  History and Physical: For full details, please see admission history and physical. Briefly, Krystal Reynolds is a 41 y.o. year old patient who was undergoing an evaluation for bariatric surgery which included a screening abdominal ultrasound on 09/28/11 when she was found to have an incidentally noted cystic mass of the pancreas. She was subsequently evaluated by Dr. 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 Christella Hartigan. In addition, she was noted on her CT scan to also have an incidental 3.0 x 2.6 cm enhancing mass off the lower pole of the right kidney concerning for renal cell carcinoma. Her contralateral kidney appears normal. She appears to have single right renal vessels. There is no evidence of adrenal abnormalities, regional lymphadenopathy, renal vein/IVC involvement, or other abdominal metastases.  She denies any hematuria or abdominal pain complaints. She has no family history of kidney cancer. She did have proteinuria as a child and underwent a kidney biopsy which apparently did not demonstrate any concerning findings. Her proteinuria subsequently resolved and she has had no problems related to chronic kidney disease. She did recently undergo a biopsy of a pancreatic mass by Dr. Wendall Papa. Per the patient, she did receive the results from Dr. Christella Hartigan recently and apparently there were no findings to raise concern for malignancy.    Hospital Course:  Pt was admitted and taken to the OR on 01/08/12 for robotic assisted laparoscopic right partial nephrectomy.  Pt tolerated the procedure well  and was hemodynamically stable immediately post op. She was extubated without complication and woke up from anesthesia neurologically intact.  She was transferred to PACU and then to the floor without difficulty.  Her post op course has progressed as expected.  She has remained AF with stable vital signs.  Her foley was removed and she has been able to void.  She is ambulating and tolerating a regular diet.  JP drainage was checked and fluid Cr value is consistent with serum.  Drain was d/c'd.  She is doing well POD 2 and felt stable for d/c home.  Laboratory values:  Basename 01/09/12 0530 01/08/12 1059  HGB 13.2 14.8  HCT 37.5 42.2    Disposition: Home  Discharge instruction: The patient was instructed to be ambulatory but to refrain from heavy lifting, strenuous activity, or driving.   Discharge medications:    Krystal Reynolds  Home Medication Instructions ZOX:[id number]   Printed on:01/10/12 1148  Medication Information                    citalopram (CELEXA) 20 MG tablet Take 20 mg by mouth daily with breakfast.            topiramate (TOPAMAX) 50 MG tablet Take 150 mg by mouth every evening.           docusate sodium 100 MG CAPS Take 100 mg by mouth 2 (two) times daily.           HYDROcodone-acetaminophen (NORCO/VICODIN) 5-325 MG per tablet Take 1-2 tablets by mouth every 6 (six) hours as needed (pain).             Followup:  Follow-up Information    Follow up with Crecencio Mc, MD on 02/06/2012. (at 10:00)    Contact information:   [address] [place] [facility] [address] [place] [address] [phone]

## 2012-07-31 ENCOUNTER — Ambulatory Visit (HOSPITAL_COMMUNITY)
Admission: RE | Admit: 2012-07-31 | Discharge: 2012-07-31 | Disposition: A | Discharge status: Home / Self Care | Payer: BC Managed Care – PPO | Source: Ambulatory Visit | Attending: Urology | Admitting: Urology

## 2012-07-31 ENCOUNTER — Other Ambulatory Visit (HOSPITAL_COMMUNITY): Payer: Self-pay | Admitting: Urology

## 2012-07-31 DIAGNOSIS — C649 Malignant neoplasm of unspecified kidney, except renal pelvis: Secondary | ICD-10-CM | POA: Insufficient documentation

## 2012-07-31 DIAGNOSIS — I10 Essential (primary) hypertension: Secondary | ICD-10-CM | POA: Insufficient documentation

## 2013-02-12 ENCOUNTER — Ambulatory Visit (HOSPITAL_COMMUNITY)
Admission: RE | Admit: 2013-02-12 | Discharge: 2013-02-12 | Disposition: A | Discharge status: Home / Self Care | Payer: BC Managed Care – PPO | Source: Ambulatory Visit | Attending: Urology | Admitting: Urology

## 2013-02-12 ENCOUNTER — Other Ambulatory Visit (HOSPITAL_COMMUNITY): Payer: Self-pay | Admitting: Urology

## 2013-02-12 DIAGNOSIS — C649 Malignant neoplasm of unspecified kidney, except renal pelvis: Secondary | ICD-10-CM

## 2013-02-19 ENCOUNTER — Other Ambulatory Visit (HOSPITAL_COMMUNITY): Payer: Self-pay | Admitting: Urology

## 2013-02-19 DIAGNOSIS — C649 Malignant neoplasm of unspecified kidney, except renal pelvis: Secondary | ICD-10-CM

## 2013-02-19 DIAGNOSIS — N2889 Other specified disorders of kidney and ureter: Secondary | ICD-10-CM

## 2013-02-28 ENCOUNTER — Ambulatory Visit (HOSPITAL_COMMUNITY)
Admission: RE | Admit: 2013-02-28 | Discharge: 2013-02-28 | Disposition: A | Discharge status: Home / Self Care | Payer: BC Managed Care – PPO | Source: Ambulatory Visit | Attending: Urology | Admitting: Urology

## 2013-02-28 DIAGNOSIS — C649 Malignant neoplasm of unspecified kidney, except renal pelvis: Secondary | ICD-10-CM | POA: Insufficient documentation

## 2013-02-28 DIAGNOSIS — K869 Disease of pancreas, unspecified: Secondary | ICD-10-CM | POA: Insufficient documentation

## 2013-02-28 DIAGNOSIS — N2889 Other specified disorders of kidney and ureter: Secondary | ICD-10-CM

## 2013-02-28 MED ORDER — GADOBENATE DIMEGLUMINE 529 MG/ML IV SOLN
20.0000 mL | Freq: Once | INTRAVENOUS | Status: AC | PRN
  Administered 2013-02-28: 20 mL via INTRAVENOUS

## 2013-05-29 ENCOUNTER — Ambulatory Visit (HOSPITAL_BASED_OUTPATIENT_CLINIC_OR_DEPARTMENT_OTHER): Payer: BC Managed Care – PPO | Admitting: Genetic Counselor

## 2013-05-29 ENCOUNTER — Other Ambulatory Visit: Payer: BC Managed Care – PPO

## 2013-05-29 ENCOUNTER — Encounter: Payer: Self-pay | Admitting: Genetic Counselor

## 2013-05-29 DIAGNOSIS — C801 Malignant (primary) neoplasm, unspecified: Secondary | ICD-10-CM | POA: Insufficient documentation

## 2013-05-29 DIAGNOSIS — IMO0002 Reserved for concepts with insufficient information to code with codable children: Secondary | ICD-10-CM

## 2013-05-29 DIAGNOSIS — C649 Malignant neoplasm of unspecified kidney, except renal pelvis: Secondary | ICD-10-CM

## 2013-05-29 NOTE — Progress Notes (Signed)
Dr.  Cathleen Fears requested a consultation for genetic counseling and risk assessment for Krystal Reynolds, a 43 y.o. female, for discussion of her personal history of bilateral clear cell renal carcinoma, pancreatic cyst and mucosal cyst.  She presents to clinic today to discuss the possibility of a genetic predisposition to cancer, and to further clarify her risks, as well as her family members' risks for cancer.   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 This was treated with partial nephrectomy.  In late 2014 she was diagnosed with another renal carcinoma in her right kidney.  At the time of the original diagnosis, she was found to have a pancreatic cyst, that appears to have shrunk in size.  She has also been found to have a mucosal cyst in her right sinus cavity. She denies having a history of fibroids or lyomyomas, GIST tumors or pneumothroax.   Past Medical History  Diagnosis Date  . Leg swelling 01-02-12    occ. episodes of pitting edema lower extremities at end of day.  . Hypertension   . Preeclampsia 1998    first pregnancy - not currently pregnant  . Sleep apnea 01-02-12    dx. 16 months ago-using cpap  . 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  . Chronic kidney disease 01-02-12    dx. rt. kidney neoplasm-surgery planned, past hx. protein in urine.  . Headache(784.0) 01-02-12    constant pain level headache "2-3"  . Anemia 01-02-12    mostly low iron- tx. oral meds, not in several yrs  . Cancer 2013, 2015    clear cell renal cell carcinoma    Past Surgical History  Procedure Laterality Date  . Abdominal hysterectomy  2008  . Breath tek h pylori  09/20/2011    Procedure: BREATH TEK H PYLORI;  Surgeon: Shann Medal, MD;  Location: [facility];  Service: General;  Laterality: N/A;  . Eus  12/07/2011    Procedure: UPPER ENDOSCOPIC ULTRASOUND (EUS) LINEAR;   Surgeon: Milus Banister, MD;  Location: [facility];  Service: Endoscopy;  Laterality: N/A;  radial linear  . Childbirth  01-02-12    x2 NVD- pre-exclampsia with pregrancy    History   Social History  . Marital Status: Married    Spouse Name: N/A    Number of Children: 2  . Years of Education: N/A   Occupational History  . teacher    Social History Main Topics  . Smoking status: Never Smoker   . Smokeless tobacco: Not on file  . Alcohol Use: Yes     Comment: rarely  . Drug Use: No  . Sexual Activity: Yes   Other Topics Concern  . Not on file   Social History Narrative  . No narrative on file    REPRODUCTIVE HISTORY AND PERSONAL RISK ASSESSMENT FACTORS: Menarche was at age 33.   premenopausal Uterus Intact: no, removed at age 43 as a result of ovarian cysts causing increased bleeding Ovaries Intact: yes G2P2A0, first live birth at age 23  She has not previously undergone treatment for infertility.   Oral Contraceptive use: 4 years   She has not used HRT in the past.    FAMILY HISTORY:  We obtained a detailed, 4-generation family history.  Significant diagnoses are listed below: Family History  Problem Relation Age of Onset  . Cancer  Paternal Uncle     lung  . AAA (abdominal aortic aneurysm) Maternal Grandfather     60s-70s    Patient's ancestors are of [place] and [place] descent. There is no reported Ashkenazi Jewish ancestry. There is no known consanguinity.  GENETIC COUNSELING ASSESSMENT: Krystal Reynolds is a 43 y.o. 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 We, therefore, discussed and recommended the following at today's visit.   DISCUSSION: We reviewed the characteristics, features and inheritance patterns of hereditary cancer syndromes. We also discussed genetic testing, including the  appropriate family members to test, the process of testing, insurance coverage and turn-around-time for results. We reviewed different renal cell carcinoma's and discussed genetic testing.  Based on her early age of diagnosis and bilateral disease, it is suggestive that her cancer is the result of a hereditary cancer syndrome.  Krystal Reynolds does not have a family history of cancer, therefore, Krystal Reynolds could have a de novo mutation.  Once results are back, we will sit down and discuss them if they show that she has a genetic change that suggests a specific syndrome.  PLAN: After considering the risks, benefits, and limitations, Krystal Reynolds provided informed consent to pursue genetic testing and the blood sample will be sent to [facility] for analysis of the RenalNext. We discussed the implications of a positive, negative and/ or variant of uncertain significance genetic test result. Results should be available within approximately 3-4 weeks' time, at which point they will be disclosed by telephone to Krystal Reynolds, as will any additional recommendations warranted by these results. Krystal Reynolds will receive a summary of her genetic counseling visit and a copy of her results once available. This information will also be available in Epic. We encouraged Krystal Reynolds to remain in contact with cancer genetics annually so that we can continuously update the family history and inform her of any changes in cancer genetics and testing that may be of benefit for her family. Krystal Reynolds questions were answered to her satisfaction today. Our contact information was provided should additional questions or concerns arise.  The patient was seen for a total of 45 minutes, greater than 50% of which was spent face-to-face counseling.  This note will also be sent to the referring provider via the electronic medical record. The patient will be supplied with a summary of this genetic counseling discussion as well  as educational information on the discussed hereditary cancer syndromes following the conclusion of their visit.   Patient was discussed with Dr. Marcy Panning.   _______________________________________________________________________ For Office Staff:  Number of people involved in session: 1 Was an Intern/ student involved with case: no

## 2013-06-30 ENCOUNTER — Telehealth: Payer: Self-pay | Admitting: Genetic Counselor

## 2013-06-30 NOTE — Telephone Encounter (Signed)
Revealed negative genetic testing on the Renal Next panel.  Specifically, negative testing on the VHL gene.  Discussed that her case was presented to the genetics conference, based on her clinical findings and negative testing.  It was suggested that if she has not had imaging of her head/spine to look for hemangioblastomas, that she may want to consider that.  Also having an eye exam to look for retinal angiomas.  Based on one paper that suggested that parents with VHL could have symptoms but test negative b/c of low level mosaicism, and that children could be positive, we also suggest that her children have a good eye exam looking for retinal angiomas as well.  Patient voiced her understanding and reported that she had a head MRI several years ago that was negative, and has had an eye exam where her eyes were dilated, within the month.  She will call her eye specialist to ask whether s/he would have found an angioma based on the exam that was performed.

## 2013-06-30 NOTE — Telephone Encounter (Signed)
Left good news message on VM.  Asked that she call back. 

## 2013-07-01 ENCOUNTER — Encounter: Payer: Self-pay | Admitting: Genetic Counselor

## 2013-07-16 ENCOUNTER — Other Ambulatory Visit (HOSPITAL_COMMUNITY): Payer: Self-pay | Admitting: Urology

## 2013-07-16 DIAGNOSIS — C649 Malignant neoplasm of unspecified kidney, except renal pelvis: Secondary | ICD-10-CM

## 2013-08-11 ENCOUNTER — Ambulatory Visit (HOSPITAL_COMMUNITY)
Admission: RE | Admit: 2013-08-11 | Discharge: 2013-08-11 | Disposition: A | Discharge status: Home / Self Care | Payer: BC Managed Care – PPO | Source: Ambulatory Visit | Attending: Urology | Admitting: Urology

## 2013-08-11 DIAGNOSIS — K862 Cyst of pancreas: Secondary | ICD-10-CM | POA: Insufficient documentation

## 2013-08-11 DIAGNOSIS — K7689 Other specified diseases of liver: Secondary | ICD-10-CM | POA: Insufficient documentation

## 2013-08-11 DIAGNOSIS — C649 Malignant neoplasm of unspecified kidney, except renal pelvis: Secondary | ICD-10-CM

## 2013-08-11 DIAGNOSIS — K863 Pseudocyst of pancreas: Secondary | ICD-10-CM

## 2013-08-11 DIAGNOSIS — Z85528 Personal history of other malignant neoplasm of kidney: Secondary | ICD-10-CM | POA: Insufficient documentation

## 2013-08-11 DIAGNOSIS — Z905 Acquired absence of kidney: Secondary | ICD-10-CM | POA: Insufficient documentation

## 2013-08-11 DIAGNOSIS — N289 Disorder of kidney and ureter, unspecified: Secondary | ICD-10-CM | POA: Insufficient documentation

## 2013-08-11 LAB — POCT I-STAT CREATININE: CREATININE: 1 mg/dL (ref 0.50–1.10)

## 2013-08-11 MED ORDER — GADOBENATE DIMEGLUMINE 529 MG/ML IV SOLN
20.0000 mL | Freq: Once | INTRAVENOUS | Status: AC | PRN
  Administered 2013-08-11: 20 mL via INTRAVENOUS

## 2013-08-13 ENCOUNTER — Ambulatory Visit (HOSPITAL_COMMUNITY)
Admission: RE | Admit: 2013-08-13 | Discharge: 2013-08-13 | Disposition: A | Discharge status: Home / Self Care | Payer: BC Managed Care – PPO | Source: Ambulatory Visit | Attending: Urology | Admitting: Urology

## 2013-08-13 ENCOUNTER — Other Ambulatory Visit (HOSPITAL_COMMUNITY): Payer: Self-pay | Admitting: Urology

## 2013-08-13 DIAGNOSIS — C649 Malignant neoplasm of unspecified kidney, except renal pelvis: Secondary | ICD-10-CM

## 2013-08-13 DIAGNOSIS — Z85528 Personal history of other malignant neoplasm of kidney: Secondary | ICD-10-CM | POA: Insufficient documentation

## 2013-08-14 ENCOUNTER — Other Ambulatory Visit: Payer: Self-pay | Admitting: Urology

## 2013-09-18 ENCOUNTER — Encounter (HOSPITAL_COMMUNITY): Payer: Self-pay | Admitting: Pharmacy Technician

## 2013-09-23 NOTE — Patient Instructions (Addendum)
Krystal Reynolds  09/23/2013   Your procedure is scheduled on:  10/02/2013  [id number]  Report to [facility].  Follow the Signs to [place] at  [facility]      am  Call this number if you have problems the morning of surgery: [phone]   Remember:   Do not eat food or drink liquids after midnight.   Take these medicines the morning of surgery with A SIP OF WATER:    Do not wear jewelry, make-up or nail polish.  Do not wear lotions, powders, or perfumes.   Do not shave 48 hours prior to surgery.  Do not bring valuables to the hospital.  Contacts, dentures or bridgework may not be worn into surgery.  Leave suitcase in the car. After surgery it may be brought to your room.  For patients admitted to the hospital, checkout time is 11:00 AM the day of  discharge.       Please read over the following fact sheets that you were given: [facility] - Preparing for Surgery Before surgery, you can play an important role.  Because skin is not sterile, your skin needs to be as free of germs as possible.  You can reduce the number of germs on your skin by washing with CHG (chlorahexidine gluconate) soap before surgery.  CHG is an antiseptic cleaner which kills germs and bonds with the skin to continue killing germs even after washing. Please DO NOT use if you have an allergy to CHG or antibacterial soaps.  If your skin becomes reddened/irritated stop using the CHG and inform your nurse when you arrive at Short Stay. Do not shave (including legs and underarms) for at least 48 hours prior to the first CHG shower.  You may shave your face/neck. Please follow these instructions carefully:  1.  Shower with CHG Soap the night before surgery and the  morning of Surgery.  2.  If you choose to wash your hair, wash your hair first as usual with your  normal  shampoo.  3.  After you shampoo, rinse your hair and body thoroughly to remove the  shampoo.                           4.  Use CHG as you  would any other liquid soap.  You can apply chg directly  to the skin and wash                       Gently with a scrungie or clean washcloth.  5.  Apply the CHG Soap to your body ONLY FROM THE NECK DOWN.   Do not use on face/ open                           Wound or open sores. Avoid contact with eyes, ears mouth and genitals (private parts).                       Wash face,  Genitals (private parts) with your normal soap.             6.  Wash thoroughly, paying special attention to the area where your surgery  will be performed.  7.  Thoroughly rinse your body with warm water from the neck down.  8.  DO NOT shower/wash with your normal soap after using and rinsing  off  the CHG Soap.                9.  Pat yourself dry with a clean towel.            10.  Wear clean pajamas.            11.  Place clean sheets on your bed the night of your first shower and do not  sleep with pets. Day of Surgery : Do not apply any lotions/deodorants the morning of surgery.  Please wear clean clothes to the hospital/surgery center.  FAILURE TO FOLLOW THESE INSTRUCTIONS MAY RESULT IN THE CANCELLATION OF YOUR SURGERY PATIENT SIGNATURE_________________________________  NURSE SIGNATURE__________________________________  ________________________________________________________________________  WHAT IS A BLOOD TRANSFUSION? Blood Transfusion Information  A transfusion is the replacement of blood or some of its parts. Blood is made up of multiple cells which provide different functions.  Red blood cells carry oxygen and are used for blood loss replacement.  White blood cells fight against infection.  Platelets control bleeding.  Plasma helps clot blood.  Other blood products are available for specialized needs, such as hemophilia or other clotting disorders. BEFORE THE TRANSFUSION  Who gives blood for transfusions?   Healthy volunteers who are fully evaluated to make sure their blood is safe. This is blood  bank blood. Transfusion therapy is the safest it has ever been in the practice of medicine. Before blood is taken from a donor, a complete history is taken to make sure that person has no history of diseases nor engages in risky social behavior (examples are intravenous drug use or sexual activity with multiple partners). The donor's travel history is screened to minimize risk of transmitting infections, such as malaria. The donated blood is tested for signs of infectious diseases, such as HIV and hepatitis. The blood is then tested to be sure it is compatible with you in order to minimize the chance of a transfusion reaction. If you or a relative donates blood, this is often done in anticipation of surgery and is not appropriate for emergency situations. It takes many days to process the donated blood. RISKS AND COMPLICATIONS Although transfusion therapy is very safe and saves many lives, the main dangers of transfusion include:   Getting an infectious disease.  Developing a transfusion reaction. This is an allergic reaction to something in the blood you were given. Every precaution is taken to prevent this. The decision to have a blood transfusion has been considered carefully by your caregiver before blood is given. Blood is not given unless the benefits outweigh the risks. AFTER THE TRANSFUSION  Right after receiving a blood transfusion, you will usually feel much better and more energetic. This is especially true if your red blood cells have gotten low (anemic). The transfusion raises the level of the red blood cells which carry oxygen, and this usually causes an energy increase.  The nurse administering the transfusion will monitor you carefully for complications. HOME CARE INSTRUCTIONS  No special instructions are needed after a transfusion. You may find your energy is better. Speak with your caregiver about any limitations on activity for underlying diseases you may have. SEEK MEDICAL CARE  IF:   Your condition is not improving after your transfusion.  You develop redness or irritation at the intravenous (IV) site. SEEK IMMEDIATE MEDICAL CARE IF:  Any of the following symptoms occur over the next 12 hours:  Shaking chills.  You have a temperature by mouth above 102 F (38.9 C), not  controlled by medicine.  Chest, back, or muscle pain.  People around you feel you are not acting correctly or are confused.  Shortness of breath or difficulty breathing.  Dizziness and fainting.  You get a rash or develop hives.  You have a decrease in urine output.  Your urine turns a dark color or changes to pink, red, or brown. Any of the following symptoms occur over the next 10 days:  You have a temperature by mouth above 102 F (38.9 C), not controlled by medicine.  Shortness of breath.  Weakness after normal activity.  The white part of the eye turns yellow (jaundice).  You have a decrease in the amount of urine or are urinating less often.  Your urine turns a dark color or changes to pink, red, or brown. Document Released: 04/14/2000 Document Revised: 07/10/2011 Document Reviewed: 12/02/2007 ExitCare Patient Information 2014 [place].  _______________________________________________________________________  Incentive Spirometer  An incentive spirometer is a tool that can help keep your lungs clear and active. This tool measures how well you are filling your lungs with each breath. Taking long deep breaths may help reverse or decrease the chance of developing breathing (pulmonary) problems (especially infection) following:  A long period of time when you are unable to move or be active. BEFORE THE PROCEDURE   If the spirometer includes an indicator to show your best effort, your nurse or respiratory therapist will set it to a desired goal.  If possible, sit up straight or lean slightly forward. Try not to slouch.  Hold the incentive spirometer in an  upright position. INSTRUCTIONS FOR USE  1. Sit on the edge of your bed if possible, or sit up as far as you can in bed or on a chair. 2. Hold the incentive spirometer in an upright position. 3. Breathe out normally. 4. Place the mouthpiece in your mouth and seal your lips tightly around it. 5. Breathe in slowly and as deeply as possible, raising the piston or the ball toward the top of the column. 6. Hold your breath for 3-5 seconds or for as long as possible. Allow the piston or ball to fall to the bottom of the column. 7. Remove the mouthpiece from your mouth and breathe out normally. 8. Rest for a few seconds and repeat Steps 1 through 7 at least 10 times every 1-2 hours when you are awake. Take your time and take a few normal breaths between deep breaths. 9. The spirometer may include an indicator to show your best effort. Use the indicator as a goal to work toward during each repetition. 10. After each set of 10 deep breaths, practice coughing to be sure your lungs are clear. If you have an incision (the cut made at the time of surgery), support your incision when coughing by placing a pillow or rolled up towels firmly against it. Once you are able to get out of bed, walk around indoors and cough well. You may stop using the incentive spirometer when instructed by your caregiver.  RISKS AND COMPLICATIONS  Take your time so you do not get dizzy or light-headed.  If you are in pain, you may need to take or ask for pain medication before doing incentive spirometry. It is harder to take a deep breath if you are having pain. AFTER USE  Rest and breathe slowly and easily.  It can be helpful to keep track of a log of your progress. Your caregiver can provide you with a simple table to  help with this. If you are using the spirometer at home, follow these instructions: [place] IF:   You are having difficultly using the spirometer.  You have trouble using the spirometer as often as  instructed.  Your pain medication is not giving enough relief while using the spirometer.  You develop fever of 100.5 F (38.1 C) or higher. SEEK IMMEDIATE MEDICAL CARE IF:   You cough up bloody sputum that had not been present before.  You develop fever of 102 F (38.9 C) or greater.  You develop worsening pain at or near the incision site. MAKE SURE YOU:   Understand these instructions.  Will watch your condition.  Will get help right away if you are not doing well or get worse. Document Released: 08/28/2006 Document Revised: 07/10/2011 Document Reviewed: 10/29/2006 ExitCare Patient Information 2014 [place].   ________________________________________________________________________  coughing and deep breathing exercises, leg exercises

## 2013-09-24 ENCOUNTER — Other Ambulatory Visit: Payer: Self-pay

## 2013-09-24 ENCOUNTER — Encounter (HOSPITAL_COMMUNITY)
Admission: RE | Admit: 2013-09-24 | Discharge: 2013-09-24 | Disposition: A | Discharge status: Home / Self Care | Payer: BC Managed Care – PPO | Source: Ambulatory Visit | Attending: Urology | Admitting: Urology

## 2013-09-24 ENCOUNTER — Encounter (HOSPITAL_COMMUNITY): Payer: Self-pay

## 2013-09-24 DIAGNOSIS — Z01810 Encounter for preprocedural cardiovascular examination: Secondary | ICD-10-CM | POA: Insufficient documentation

## 2013-09-24 DIAGNOSIS — Z01812 Encounter for preprocedural laboratory examination: Secondary | ICD-10-CM | POA: Insufficient documentation

## 2013-09-24 HISTORY — DX: Major depressive disorder, single episode, unspecified: F32.9

## 2013-09-24 HISTORY — DX: Depression, unspecified: F32.A

## 2013-09-24 HISTORY — DX: Family history of other specified conditions: Z84.89

## 2013-09-24 LAB — BASIC METABOLIC PANEL
BUN: 15 mg/dL (ref 6–23)
CHLORIDE: 100 meq/L (ref 96–112)
CO2: 26 mEq/L (ref 19–32)
Calcium: 9.2 mg/dL (ref 8.4–10.5)
Creatinine, Ser: 0.87 mg/dL (ref 0.50–1.10)
GFR, EST NON AFRICAN AMERICAN: 82 mL/min — AB (ref >90)
Glucose, Bld: 100 mg/dL — ABNORMAL HIGH (ref 70–99)
Potassium: 4.1 mEq/L (ref 3.7–5.3)
Sodium: 139 mEq/L (ref 137–147)

## 2013-09-24 LAB — CBC
HCT: 38.3 % (ref 36.0–46.0)
Hemoglobin: 13.3 g/dL (ref 12.0–15.0)
MCH: 29.7 pg (ref 26.0–34.0)
MCHC: 34.7 g/dL (ref 30.0–36.0)
MCV: 85.5 fL (ref 78.0–100.0)
Platelets: 342 10*3/uL (ref 150–400)
RBC: 4.48 MIL/uL (ref 3.87–5.11)
RDW: 12.7 % (ref 11.5–15.5)
WBC: 10.2 10*3/uL (ref 4.0–10.5)

## 2013-10-01 LAB — TYPE AND SCREEN
ABO/RH(D): O POS
ANTIBODY SCREEN: NEGATIVE

## 2013-10-01 NOTE — H&P (Signed)
  History of Present Illness Ms. Brierley is a 43 year old with renal cell carcinoma s/p a right robotic partial nephrectomy on 01/08/12 for an incidentally detected 3.3 cm renal mass during an evaluation for gastric bypass surgery.    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  Her metastatic evaluation has been negative. She tested negative for VHL.  Past Medical History Problems  1. History of anemia (V12.3) 2. History of hypertension (V12.59) 3. History of sleep apnea (V13.89)  Surgical History Problems  1. History of Hysterectomy 2. History of Kidney Surgery Laparoscopic Partial Nephrectomy  Current Meds 1. Citalopram Hydrobromide 20 MG Oral Tablet;  Therapy: (Recorded:08Aug2013) to Recorded 2. CPAP;  Therapy: (Recorded:13Aug2013) to Recorded 3. Nasonex 50 MCG/ACT Nasal Suspension;  Therapy: [id number] to Recorded 4. Patanase 0.6 % Nasal Solution;  Therapy: [id number] to Recorded 5. Topiramate 100 MG Oral Tablet;  Therapy: (Recorded:08Aug2013) to Recorded 6. Topiramate 50 MG Oral Tablet;  Therapy: 12Aug2013 to Recorded  Allergies Medication  1. No Known Drug Allergies  Family History Problems  1. Family history of Hypertension (V17.49) 2. Denied: Family history of Kidney Cancer 3. Family history of Lung Cancer (V16.1)  Social History Problems  1. Alcohol Use   rarely drinks alcohol 2. Marital History - Currently Married 3. Never A Smoker  Review of Systems  Genitourinary: no hematuria.  Constitutional: no recent weight loss.  Cardiovascular: no chest pain and no leg swelling.     Physical Exam Constitutional: Well nourished and well developed . No acute distress.  ENT:. The ears and nose are normal in  appearance.  Neck: The appearance of the neck is normal and no neck mass is present.  Pulmonary: No respiratory distress, normal respiratory rhythm and effort and clear bilateral breath sounds.  Cardiovascular: Heart rate and rhythm are normal . No peripheral edema.  Abdomen: Incision site(s) well healed. The abdomen is soft and nontender. No masses are palpated. No CVA tenderness. No hernias are palpable. No hepatosplenomegaly noted.  Lymphatics: The supraclavicular, femoral and inguinal nodes are not enlarged or tender.  Skin: Normal skin turgor, no visible rash and no visible skin lesions.  Neuro/Psych:. Mood and affect are appropriate.     Assessment Assessed  1. Renal cell carcinoma,   Discussion/Summary 1. Left renal mass: She will undergo a left RAL partial nephrectomy. She will need intraoperative ultrasound considering the endophytic nature of this mass and understands the possibility of open surgical conversion or need for total nephrectomy if felt to be in her best interest.

## 2013-10-01 NOTE — Anesthesia Preprocedure Evaluation (Addendum)
Anesthesia Evaluation  Patient identified by MRN, date of birth, ID band Patient awake    Reviewed: Allergy & Precautions, H&P , NPO status , Patient's Chart, lab work & pertinent test results  Airway Mallampati: III TM Distance: <3 FB Neck ROM: Full    Dental no notable dental hx.    Pulmonary sleep apnea ,  breath sounds clear to auscultation  + decreased breath sounds      Cardiovascular hypertension, Rhythm:Regular Rate:Normal     Neuro/Psych negative neurological ROS  negative psych ROS   GI/Hepatic negative GI ROS, Neg liver ROS,   Endo/Other  Morbid obesity  Renal/GU negative Renal ROS  negative genitourinary   Musculoskeletal negative musculoskeletal ROS (+)   Abdominal   Peds negative pediatric ROS (+)  Hematology negative hematology ROS (+)   Anesthesia Other Findings   Reproductive/Obstetrics negative OB ROS                          Anesthesia Physical Anesthesia Plan  ASA: III  Anesthesia Plan: General   Post-op Pain Management:    Induction: Intravenous  Airway Management Planned: Oral ETT  Additional Equipment:   Intra-op Plan:   Post-operative Plan: Extubation in OR  Informed Consent: I have reviewed the patients History and Physical, chart, labs and discussed the procedure including the risks, benefits and alternatives for the proposed anesthesia with the patient or authorized representative who has indicated his/her understanding and acceptance.   Dental advisory given  Plan Discussed with: CRNA and Surgeon  Anesthesia Plan Comments:         Anesthesia Quick Evaluation

## 2013-10-02 ENCOUNTER — Encounter (HOSPITAL_COMMUNITY)
Admission: RE | Disposition: A | Discharge status: Home / Self Care | Payer: Self-pay | Source: Ambulatory Visit | Attending: Urology

## 2013-10-02 ENCOUNTER — Encounter (HOSPITAL_COMMUNITY): Payer: BC Managed Care – PPO | Admitting: Anesthesiology

## 2013-10-02 ENCOUNTER — Encounter (HOSPITAL_COMMUNITY): Payer: Self-pay | Admitting: *Deleted

## 2013-10-02 ENCOUNTER — Inpatient Hospital Stay (HOSPITAL_COMMUNITY): Payer: BC Managed Care – PPO | Admitting: Anesthesiology

## 2013-10-02 ENCOUNTER — Inpatient Hospital Stay (HOSPITAL_COMMUNITY)
Admission: RE | Admit: 2013-10-02 | Discharge: 2013-10-04 | DRG: 658 | Disposition: A | Discharge status: Home / Self Care | Payer: BC Managed Care – PPO | Source: Ambulatory Visit | Attending: Urology | Admitting: Urology

## 2013-10-02 DIAGNOSIS — Z905 Acquired absence of kidney: Secondary | ICD-10-CM

## 2013-10-02 DIAGNOSIS — Z90710 Acquired absence of both cervix and uterus: Secondary | ICD-10-CM

## 2013-10-02 DIAGNOSIS — Z01810 Encounter for preprocedural cardiovascular examination: Secondary | ICD-10-CM

## 2013-10-02 DIAGNOSIS — Z801 Family history of malignant neoplasm of trachea, bronchus and lung: Secondary | ICD-10-CM

## 2013-10-02 DIAGNOSIS — Z8249 Family history of ischemic heart disease and other diseases of the circulatory system: Secondary | ICD-10-CM

## 2013-10-02 DIAGNOSIS — G4730 Sleep apnea, unspecified: Secondary | ICD-10-CM | POA: Diagnosis present

## 2013-10-02 DIAGNOSIS — Z01812 Encounter for preprocedural laboratory examination: Secondary | ICD-10-CM

## 2013-10-02 DIAGNOSIS — C649 Malignant neoplasm of unspecified kidney, except renal pelvis: Principal | ICD-10-CM | POA: Diagnosis present

## 2013-10-02 DIAGNOSIS — I10 Essential (primary) hypertension: Secondary | ICD-10-CM | POA: Diagnosis present

## 2013-10-02 DIAGNOSIS — D49519 Neoplasm of unspecified behavior of unspecified kidney: Secondary | ICD-10-CM | POA: Diagnosis present

## 2013-10-02 DIAGNOSIS — Z6839 Body mass index (BMI) 39.0-39.9, adult: Secondary | ICD-10-CM

## 2013-10-02 DIAGNOSIS — Z79899 Other long term (current) drug therapy: Secondary | ICD-10-CM

## 2013-10-02 HISTORY — PX: ROBOTIC ASSITED PARTIAL NEPHRECTOMY: SHX6087

## 2013-10-02 LAB — BASIC METABOLIC PANEL
BUN: 14 mg/dL (ref 6–23)
CO2: 25 mEq/L (ref 19–32)
Calcium: 9.1 mg/dL (ref 8.4–10.5)
Chloride: 99 mEq/L (ref 96–112)
Creatinine, Ser: 1.05 mg/dL (ref 0.50–1.10)
GFR, EST AFRICAN AMERICAN: 75 mL/min — AB (ref >90)
GFR, EST NON AFRICAN AMERICAN: 65 mL/min — AB (ref >90)
Glucose, Bld: 150 mg/dL — ABNORMAL HIGH (ref 70–99)
POTASSIUM: 4.6 meq/L (ref 3.7–5.3)
Sodium: 136 mEq/L — ABNORMAL LOW (ref 137–147)

## 2013-10-02 LAB — HEMOGLOBIN AND HEMATOCRIT, BLOOD
HEMATOCRIT: 40.7 % (ref 36.0–46.0)
Hemoglobin: 14.2 g/dL (ref 12.0–15.0)

## 2013-10-02 SURGERY — ROBOTIC ASSITED PARTIAL NEPHRECTOMY
Anesthesia: General | Laterality: Left

## 2013-10-02 MED ORDER — BUPIVACAINE LIPOSOME 1.3 % IJ SUSP
20.0000 mL | Freq: Once | INTRAMUSCULAR | Status: AC
  Administered 2013-10-02: 20 mL
  Filled 2013-10-02: qty 20

## 2013-10-02 MED ORDER — HYDROMORPHONE HCL PF 1 MG/ML IJ SOLN
INTRAMUSCULAR | Status: AC
  Filled 2013-10-02: qty 1

## 2013-10-02 MED ORDER — HYDROMORPHONE HCL PF 1 MG/ML IJ SOLN
0.2500 mg | INTRAMUSCULAR | Status: DC | PRN
  Administered 2013-10-02: 0.25 mg via INTRAVENOUS
  Administered 2013-10-02: 0.5 mg via INTRAVENOUS

## 2013-10-02 MED ORDER — ONDANSETRON HCL 4 MG/2ML IJ SOLN
INTRAMUSCULAR | Status: DC | PRN
  Administered 2013-10-02: 4 mg via INTRAVENOUS

## 2013-10-02 MED ORDER — DIPHENHYDRAMINE HCL 12.5 MG/5ML PO ELIX: 12.5000 mg | ORAL_SOLUTION | Freq: Four times a day (QID) | ORAL | Status: DC | PRN

## 2013-10-02 MED ORDER — LIDOCAINE HCL (CARDIAC) 20 MG/ML IV SOLN
INTRAVENOUS | Status: AC
  Filled 2013-10-02: qty 5

## 2013-10-02 MED ORDER — DIPHENHYDRAMINE HCL 50 MG/ML IJ SOLN: 12.5000 mg | Freq: Four times a day (QID) | INTRAMUSCULAR | Status: DC | PRN

## 2013-10-02 MED ORDER — DEXAMETHASONE SODIUM PHOSPHATE 10 MG/ML IJ SOLN
INTRAMUSCULAR | Status: DC | PRN
  Administered 2013-10-02: 10 mg via INTRAVENOUS

## 2013-10-02 MED ORDER — MIDAZOLAM HCL 5 MG/5ML IJ SOLN
INTRAMUSCULAR | Status: DC | PRN
  Administered 2013-10-02: 2 mg via INTRAVENOUS

## 2013-10-02 MED ORDER — HYDRALAZINE HCL 20 MG/ML IJ SOLN
INTRAMUSCULAR | Status: DC | PRN
  Administered 2013-10-02: 5 mg via INTRAVENOUS

## 2013-10-02 MED ORDER — MORPHINE SULFATE 2 MG/ML IJ SOLN
2.0000 mg | INTRAMUSCULAR | Status: DC | PRN
  Administered 2013-10-02 (×3): 2 mg via INTRAVENOUS
  Filled 2013-10-02 (×3): qty 1

## 2013-10-02 MED ORDER — CEFAZOLIN SODIUM-DEXTROSE 2-3 GM-% IV SOLR
2.0000 g | INTRAVENOUS | Status: AC
  Administered 2013-10-02: 2 g via INTRAVENOUS

## 2013-10-02 MED ORDER — CEFAZOLIN SODIUM-DEXTROSE 2-3 GM-% IV SOLR
INTRAVENOUS | Status: AC
  Filled 2013-10-02: qty 50

## 2013-10-02 MED ORDER — LABETALOL HCL 5 MG/ML IV SOLN
INTRAVENOUS | Status: AC
  Filled 2013-10-02: qty 4

## 2013-10-02 MED ORDER — DOCUSATE SODIUM 100 MG PO CAPS
100.0000 mg | ORAL_CAPSULE | Freq: Two times a day (BID) | ORAL | Status: DC
  Administered 2013-10-02 – 2013-10-04 (×4): 100 mg via ORAL
  Filled 2013-10-02 (×6): qty 1

## 2013-10-02 MED ORDER — CISATRACURIUM BESYLATE 20 MG/10ML IV SOLN
INTRAVENOUS | Status: AC
  Filled 2013-10-02: qty 10

## 2013-10-02 MED ORDER — HYDROMORPHONE HCL PF 1 MG/ML IJ SOLN
INTRAMUSCULAR | Status: DC | PRN
  Administered 2013-10-02 (×3): .4 mg via INTRAVENOUS

## 2013-10-02 MED ORDER — LACTATED RINGERS IR SOLN
Status: DC | PRN
  Administered 2013-10-02: 1000 mL

## 2013-10-02 MED ORDER — CISATRACURIUM BESYLATE (PF) 10 MG/5ML IV SOLN
INTRAVENOUS | Status: DC | PRN
  Administered 2013-10-02: 4 mg via INTRAVENOUS
  Administered 2013-10-02: 2 mg via INTRAVENOUS
  Administered 2013-10-02: 14 mg via INTRAVENOUS

## 2013-10-02 MED ORDER — NEOSTIGMINE METHYLSULFATE 10 MG/10ML IV SOLN
INTRAVENOUS | Status: AC
  Filled 2013-10-02: qty 1

## 2013-10-02 MED ORDER — FENTANYL CITRATE 0.05 MG/ML IJ SOLN
INTRAMUSCULAR | Status: AC
  Filled 2013-10-02: qty 2

## 2013-10-02 MED ORDER — PROPOFOL 10 MG/ML IV BOLUS
INTRAVENOUS | Status: AC
  Filled 2013-10-02: qty 20

## 2013-10-02 MED ORDER — ONDANSETRON HCL 4 MG/2ML IJ SOLN
4.0000 mg | INTRAMUSCULAR | Status: DC | PRN
  Administered 2013-10-02: 4 mg via INTRAVENOUS
  Filled 2013-10-02: qty 2

## 2013-10-02 MED ORDER — LIDOCAINE HCL (CARDIAC) 20 MG/ML IV SOLN
INTRAVENOUS | Status: DC | PRN
  Administered 2013-10-02: 60 mg via INTRAVENOUS

## 2013-10-02 MED ORDER — EPHEDRINE SULFATE 50 MG/ML IJ SOLN
INTRAMUSCULAR | Status: AC
  Filled 2013-10-02: qty 1

## 2013-10-02 MED ORDER — SODIUM CHLORIDE 0.9 % IJ SOLN
INTRAMUSCULAR | Status: AC
  Filled 2013-10-02: qty 10

## 2013-10-02 MED ORDER — PROMETHAZINE HCL 25 MG/ML IJ SOLN: 6.2500 mg | INTRAMUSCULAR | Status: DC | PRN

## 2013-10-02 MED ORDER — FENTANYL CITRATE 0.05 MG/ML IJ SOLN
INTRAMUSCULAR | Status: AC
  Filled 2013-10-02: qty 5

## 2013-10-02 MED ORDER — MANNITOL 25 % IV SOLN
25.0000 g | Freq: Once | INTRAVENOUS | Status: AC
  Administered 2013-10-02 (×2): 12.5 g via INTRAVENOUS
  Filled 2013-10-02: qty 100

## 2013-10-02 MED ORDER — LABETALOL HCL 5 MG/ML IV SOLN
INTRAVENOUS | Status: DC | PRN
  Administered 2013-10-02: 2.5 mg via INTRAVENOUS

## 2013-10-02 MED ORDER — MIDAZOLAM HCL 2 MG/2ML IJ SOLN
INTRAMUSCULAR | Status: AC
  Filled 2013-10-02: qty 2

## 2013-10-02 MED ORDER — GLYCOPYRROLATE 0.2 MG/ML IJ SOLN
INTRAMUSCULAR | Status: AC
  Filled 2013-10-02: qty 3

## 2013-10-02 MED ORDER — FENTANYL CITRATE 0.05 MG/ML IJ SOLN
INTRAMUSCULAR | Status: DC | PRN
  Administered 2013-10-02 (×2): 75 ug via INTRAVENOUS
  Administered 2013-10-02 (×2): 100 ug via INTRAVENOUS

## 2013-10-02 MED ORDER — DEXAMETHASONE SODIUM PHOSPHATE 10 MG/ML IJ SOLN
INTRAMUSCULAR | Status: AC
  Filled 2013-10-02: qty 1

## 2013-10-02 MED ORDER — LACTATED RINGERS IV SOLN
INTRAVENOUS | Status: DC | PRN
  Administered 2013-10-02 (×2): via INTRAVENOUS

## 2013-10-02 MED ORDER — SODIUM CHLORIDE 0.9 % IJ SOLN
INTRAMUSCULAR | Status: AC
  Filled 2013-10-02: qty 20

## 2013-10-02 MED ORDER — NEOSTIGMINE METHYLSULFATE 10 MG/10ML IV SOLN
INTRAVENOUS | Status: DC | PRN
  Administered 2013-10-02: 4 mg via INTRAVENOUS

## 2013-10-02 MED ORDER — HYDRALAZINE HCL 20 MG/ML IJ SOLN
INTRAMUSCULAR | Status: AC
  Filled 2013-10-02: qty 1

## 2013-10-02 MED ORDER — STERILE WATER FOR IRRIGATION IR SOLN
Status: DC | PRN
  Administered 2013-10-02: 3000 mL

## 2013-10-02 MED ORDER — ACETAMINOPHEN 10 MG/ML IV SOLN
1000.0000 mg | Freq: Once | INTRAVENOUS | Status: AC
Start: 2013-10-02 — End: 2013-10-02
  Administered 2013-10-02: 1000 mg via INTRAVENOUS
  Filled 2013-10-02: qty 100

## 2013-10-02 MED ORDER — CITALOPRAM HYDROBROMIDE 20 MG PO TABS
20.0000 mg | ORAL_TABLET | Freq: Every day | ORAL | Status: DC
  Administered 2013-10-03 – 2013-10-04 (×2): 20 mg via ORAL
  Filled 2013-10-02 (×3): qty 1

## 2013-10-02 MED ORDER — HYDROMORPHONE HCL PF 2 MG/ML IJ SOLN
INTRAMUSCULAR | Status: AC
  Filled 2013-10-02: qty 1

## 2013-10-02 MED ORDER — ONDANSETRON HCL 4 MG/2ML IJ SOLN
INTRAMUSCULAR | Status: AC
  Filled 2013-10-02: qty 2

## 2013-10-02 MED ORDER — PROPOFOL 10 MG/ML IV BOLUS
INTRAVENOUS | Status: DC | PRN
  Administered 2013-10-02: 200 mg via INTRAVENOUS

## 2013-10-02 MED ORDER — CEFAZOLIN SODIUM 1-5 GM-% IV SOLN
1.0000 g | Freq: Three times a day (TID) | INTRAVENOUS | Status: AC
  Administered 2013-10-02 (×2): 1 g via INTRAVENOUS
  Filled 2013-10-02 (×2): qty 50

## 2013-10-02 MED ORDER — DEXTROSE-NACL 5-0.45 % IV SOLN
INTRAVENOUS | Status: DC
  Administered 2013-10-02 – 2013-10-03 (×4): via INTRAVENOUS

## 2013-10-02 MED ORDER — GLYCOPYRROLATE 0.2 MG/ML IJ SOLN
INTRAMUSCULAR | Status: DC | PRN
  Administered 2013-10-02: 0.6 mg via INTRAVENOUS
  Administered 2013-10-02: 0.2 mg via INTRAVENOUS

## 2013-10-02 MED ORDER — ACETAMINOPHEN 10 MG/ML IV SOLN
1000.0000 mg | Freq: Four times a day (QID) | INTRAVENOUS | Status: AC
  Administered 2013-10-02 – 2013-10-03 (×4): 1000 mg via INTRAVENOUS
  Filled 2013-10-02 (×6): qty 100

## 2013-10-02 SURGICAL SUPPLY — 56 items
APPLICATOR SURGIFLO ENDO (HEMOSTASIS) IMPLANT
CHLORAPREP W/TINT 26ML (MISCELLANEOUS) ×3 IMPLANT
CLIP LIGATING HEM O LOK PURPLE (MISCELLANEOUS) ×3 IMPLANT
CLIP LIGATING HEMO O LOK GREEN (MISCELLANEOUS) ×6 IMPLANT
CORDS BIPOLAR (ELECTRODE) ×3 IMPLANT
COVER SURGICAL LIGHT HANDLE (MISCELLANEOUS) ×3 IMPLANT
COVER TIP SHEARS 8 DVNC (MISCELLANEOUS) ×1 IMPLANT
COVER TIP SHEARS 8MM DA VINCI (MISCELLANEOUS) ×2
DECANTER SPIKE VIAL GLASS SM (MISCELLANEOUS) ×3 IMPLANT
DERMABOND ADVANCED (GAUZE/BANDAGES/DRESSINGS) ×4
DERMABOND ADVANCED .7 DNX12 (GAUZE/BANDAGES/DRESSINGS) ×2 IMPLANT
DRAIN CHANNEL 15F RND FF 3/16 (WOUND CARE) ×3 IMPLANT
DRAPE INCISE IOBAN 66X45 STRL (DRAPES) ×3 IMPLANT
DRAPE LAPAROSCOPIC ABDOMINAL (DRAPES) ×3 IMPLANT
DRAPE LG THREE QUARTER DISP (DRAPES) ×6 IMPLANT
DRAPE TABLE BACK 44X90 PK DISP (DRAPES) ×3 IMPLANT
DRAPE WARM FLUID 44X44 (DRAPE) ×3 IMPLANT
ELECT REM PT RETURN 9FT ADLT (ELECTROSURGICAL) ×3
ELECTRODE REM PT RTRN 9FT ADLT (ELECTROSURGICAL) ×1 IMPLANT
EVACUATOR SILICONE 100CC (DRAIN) ×3 IMPLANT
GLOVE BIO SURGEON STRL SZ 6.5 (GLOVE) ×2 IMPLANT
GLOVE BIO SURGEONS STRL SZ 6.5 (GLOVE) ×1
GLOVE BIOGEL M STRL SZ7.5 (GLOVE) ×6 IMPLANT
GOWN STRL REUS W/ TWL LRG LVL3 (GOWN DISPOSABLE) ×2 IMPLANT
GOWN STRL REUS W/TWL LRG LVL3 (GOWN DISPOSABLE) ×7 IMPLANT
HEMOSTAT SURGICEL 4X8 (HEMOSTASIS) IMPLANT
KIT ACCESSORY DA VINCI DISP (KITS) ×2
KIT ACCESSORY DVNC DISP (KITS) ×1 IMPLANT
KIT BASIN OR (CUSTOM PROCEDURE TRAY) ×3 IMPLANT
PENCIL BUTTON HOLSTER BLD 10FT (ELECTRODE) ×3 IMPLANT
POSITIONER SURGICAL ARM (MISCELLANEOUS) ×6 IMPLANT
POUCH SPECIMEN RETRIEVAL 10MM (ENDOMECHANICALS) ×3 IMPLANT
SET TUBE IRRIG SUCTION NO TIP (IRRIGATION / IRRIGATOR) ×3 IMPLANT
SOLUTION ANTI FOG 6CC (MISCELLANEOUS) ×3 IMPLANT
SOLUTION ELECTROLUBE (MISCELLANEOUS) ×3 IMPLANT
SPONGE LAP 18X18 X RAY DECT (DISPOSABLE) IMPLANT
SURGIFLO W/THROMBIN 8M KIT (HEMOSTASIS) ×3 IMPLANT
SUT ETHILON 3 0 PS 1 (SUTURE) ×3 IMPLANT
SUT MNCRL AB 4-0 PS2 18 (SUTURE) ×6 IMPLANT
SUT V-LOC BARB 180 2/0GR6 GS22 (SUTURE) ×6
SUT VIC AB 0 CT1 27 (SUTURE) ×2
SUT VIC AB 0 CT1 27XBRD ANTBC (SUTURE) ×1 IMPLANT
SUT VIC AB 3-0 SH 27 (SUTURE) ×2
SUT VIC AB 3-0 SH 27X BRD (SUTURE) ×1 IMPLANT
SUT VICRYL 0 UR6 27IN ABS (SUTURE) ×3 IMPLANT
SUT VLOC BARB 180 ABS3/0GR12 (SUTURE) ×6
SUTURE V-LC BRB 180 2/0GR6GS22 (SUTURE) ×2 IMPLANT
SUTURE VLOC BRB 180 ABS3/0GR12 (SUTURE) ×2 IMPLANT
TOWEL OR 17X26 10 PK STRL BLUE (TOWEL DISPOSABLE) ×6 IMPLANT
TOWEL OR NON WOVEN STRL DISP B (DISPOSABLE) ×3 IMPLANT
TRAY FOLEY BAG SILVER LF 14FR (CATHETERS) ×3 IMPLANT
TRAY FOLEY CATH 16FRSI W/METER (SET/KITS/TRAYS/PACK) IMPLANT
TRAY LAP CHOLE (CUSTOM PROCEDURE TRAY) ×3 IMPLANT
TROCAR XCEL 12X100 BLDLESS (ENDOMECHANICALS) ×3 IMPLANT
TUBING INSUFFLATION 10FT LAP (TUBING) ×3 IMPLANT
WATER STERILE IRR 1500ML POUR (IV SOLUTION) ×6 IMPLANT

## 2013-10-02 NOTE — Discharge Instructions (Signed)

## 2013-10-02 NOTE — Op Note (Signed)
Preoperative diagnosis: Left renal mass  Postoperative diagnosis: Left renal mass  Procedure:  1. Left robotic-assisted laparoscopic partial nephrectomy 2. Intraoperative renal ultrasonography  Surgeon: Pryor Curia. M.D.  Assistant(s): Leta Baptist, PA-C  Anesthesia: General  Complications: None  EBL: 100 mL  IVF:  1500 mL crystalloid  Specimens: 1. Left renal mass  Disposition of specimens: Pathology  Intraoperative findings:       1. Warm renal ischemia time: 24 minutes       2. Intraoperative renal ultrasound findings: Ultrasound of the left kidney demonstrated a solid 2.2 cm mass in the upper pole of the kidney which was mostly endophytic.  Drains: 1. # 15 Blake perinephric drain  Indication:  Krystal Reynolds is a 43 y.o. year old patient with a left renal mass.  After a thorough review of the management options for their renal mass, they elected to proceed with surgical treatment and the above procedure.  We have discussed the potential benefits and risks of the procedure, side effects of the proposed treatment, the likelihood of the patient achieving the goals of the procedure, and any potential problems that might occur during the procedure or recuperation. Informed consent has been obtained.   Description of procedure:  The patient was taken to the operating room and a general anesthetic was administered. The patient was given preoperative antibiotics, placed in the left modified flank position with care to pad all potential pressure points, and prepped and draped in the usual sterile fashion. Next a preoperative timeout was performed.  A site was selected on the left side of the umbilicus for placement of the camera port. This was placed using a standard open Hassan technique which allowed entry into the peritoneal cavity under direct vision and without difficulty. A 12 mm port was placed and a pneumoperitoneum established. The camera was then used to  inspect the abdomen and there was no evidence of any intra-abdominal injuries or other abnormalities. The remaining abdominal ports were then placed. 8 mm robotic ports were placed in the left upper quadrant, left lower quadrant, and far left lateral abdominal wall. A 12 mm port was placed in the upper midline for laparoscopic assistance. All ports were placed under direct vision without difficulty. The surgical cart was then docked.   Utilizing the cautery scissors, the white line of Toldt was incised allowing the colon to be mobilized medially and the plane between the mesocolon and the anterior layer of Gerota's fascia to be developed and the kidney to be exposed.  The ureter and gonadal vein were identified inferiorly and the ureter was lifted anteriorly off the psoas muscle.  Dissection proceeded superiorly along the gonadal vein until the renal vein was identified.  The renal hilum was then carefully isolated with a combination of blunt and sharp dissection allowing the renal arterial and venous structures to be separated and isolated in preparation for renal hilar vessel clamping. There was one main major renal artery and a second smaller renal artery just adjacent to the main artery.  There was a single renal vein.  12.5 g of IV mannitol was then administered.   Attention turned to the kidney and the perinephric fat surrounding the renal mass was removed and the kidney was mobilized sufficiently for exposure and resection of the renal mass. The mass was not able to be definitively visualized due to its endophytic nature.  Intraoperative renal ultrasonography was utilized with the laparoscopic ultrasound probe to identify the renal tumor and identify the  tumor margins. The tumor measured 2.2 cm and the margins were carefully marked along with the depth of the mass using cautery on the renal capsule.   Once the renal mass was properly isolated, preparations were made for resection of the tumor.   Reconstructive sutures were placed into the abdomen for the renorrhaphy portion of the procedure.  The two renal arteries were then clamped with bulldog clamps.  The tumor was then excised with cold scissor dissection along with an adequate visible gross margin of normal renal parenchyma. The tumor appeared to be excised without any gross violation of the tumor. The renal collecting system was entered during removal of the tumor.  Two running 3-0 V-lock sutures were then brought through the capsule of the kidney and run along the base of the renal defect to provide hemostasis and close any entry into the renal collecting system if present. Weck clips were used to secure this suture outside the renal capsule at the proximal and distal ends. An additional hemostatic agent (Surgiflo) was then placed into the renal defect. A running 2-0 V lock suture was then used to close the renal capsule using a sliding clip technique which resulted in excellent compression of the renal defect.    The bulldog clamps were then removed from the renal hilar vessel(s) and an additional 12.5 g of IV mannitol was administered. Total warm renal ischemia time was 24 minutes. The renal tumor resection site was examined. Hemostasis appeared adequate.   The kidney was placed back into its normal anatomic position and covered with perinephric fat as needed.  A # 71 Blake drain was then brought through the lateral lower port site and positioned in the perinephric space.  It was secured to the skin with a nylon suture. The surgical cart was undocked.  The renal tumor specimen was removed intact within an endopouch retrieval bag via the camera port sites.  The camera port site and the other 12 mm port site were then closed at the fascial level with 0-vicryl suture.  All other laparoscopic/robotic ports were removed under direct vision and the pneumoperitoneum let down with inspection of the operative field performed and hemostasis again  confirmed. All incision sites were then injected with local anesthetic and reapproximated at the skin level with 4-0 monocryl subcuticular closures.  Dermabond was applied to the skin.  The patient tolerated the procedure well and without complications.  The patient was able to be extubated and transferred to the recovery unit in satisfactory condition.  Pryor Curia MD

## 2013-10-02 NOTE — Anesthesia Postprocedure Evaluation (Signed)
  Anesthesia Post-op Note  Patient: Krystal Reynolds  Procedure(s) Performed: Procedure(s) (LRB): ROBOTIC ASSITED PARTIAL NEPHRECTOMY (Left)  Patient Location: PACU  Anesthesia Type: General  Level of Consciousness: awake and alert   Airway and Oxygen Therapy: Patient Spontanous Breathing  Post-op Pain: mild  Post-op Assessment: Post-op Vital signs reviewed, Patient's Cardiovascular Status Stable, Respiratory Function Stable, Patent Airway and No signs of Nausea or vomiting  Last Vitals:  Filed Vitals:   10/02/13 2103  BP: 150/72  Pulse: 69  Temp: 36.6 C  Resp:     Post-op Vital Signs: stable   Complications: No apparent anesthesia complications

## 2013-10-02 NOTE — Transfer of Care (Signed)
Immediate Anesthesia Transfer of Care Note  Patient: Krystal Reynolds  Procedure(s) Performed: Procedure(s): ROBOTIC ASSITED PARTIAL NEPHRECTOMY (Left)  Patient Location: PACU  Anesthesia Type:General  Level of Consciousness: awake, alert  and oriented  Airway & Oxygen Therapy: Patient Spontanous Breathing and Patient connected to face mask oxygen  Post-op Assessment: Report given to PACU RN and Post -op Vital signs reviewed and stable  Post vital signs: Reviewed and stable  Complications: No apparent anesthesia complications

## 2013-10-02 NOTE — Progress Notes (Signed)
Patient ID: Krystal Reynolds, female   DOB: November 29, 1970, 43 y.o.   MRN: [id number] Post-op note  Subjective: The patient is doing well.  No complaints.  Denies N/V.  Pain well controlled.   Objective: Vital signs in last 24 hours: Temp:  [97.6 F (36.4 C)-98.1 F (36.7 C)] 98.1 F (36.7 C) (06/04 1158) Pulse Rate:  [58-87] 65 (06/04 1158) Resp:  [13-18] 16 (06/04 1139) BP: (147-196)/(69-97) 154/85 mmHg (06/04 1158) SpO2:  [97 %-100 %] 97 % (06/04 1158) Weight:  [116.475 kg (256 lb 12.5 oz)] 116.475 kg (256 lb 12.5 oz) (06/04 1158)  Intake/Output from previous day:   Intake/Output this shift: Total I/O In: 1900 [I.V.:1900] Out: 490 [Urine:290; Drains:100; Blood:100]  Physical Exam:  General: Alert and oriented. Abdomen: Soft, Nondistended. Incisions: Clean and dry. Urine: clear   Lab Results:  Recent Labs  10/02/13 1048  HGB 14.2  HCT 40.7    Assessment/Plan: POD#0   1) Continue to monitor  2) DVT prophy, clears, bed rest, IS, pain control     LOS: 0 days   Marcie Bal 10/02/2013, 2:12 PM

## 2013-10-03 ENCOUNTER — Encounter (HOSPITAL_COMMUNITY): Payer: Self-pay | Admitting: Urology

## 2013-10-03 LAB — BASIC METABOLIC PANEL
BUN: 13 mg/dL (ref 6–23)
CHLORIDE: 100 meq/L (ref 96–112)
CO2: 26 mEq/L (ref 19–32)
Calcium: 9.2 mg/dL (ref 8.4–10.5)
Creatinine, Ser: 1.26 mg/dL — ABNORMAL HIGH (ref 0.50–1.10)
GFR calc Af Amer: 60 mL/min — ABNORMAL LOW (ref >90)
GFR calc non Af Amer: 52 mL/min — ABNORMAL LOW (ref >90)
GLUCOSE: 148 mg/dL — AB (ref 70–99)
POTASSIUM: 4.6 meq/L (ref 3.7–5.3)
Sodium: 138 mEq/L (ref 137–147)

## 2013-10-03 LAB — HEMOGLOBIN AND HEMATOCRIT, BLOOD
HCT: 40.2 % (ref 36.0–46.0)
Hemoglobin: 13.7 g/dL (ref 12.0–15.0)

## 2013-10-03 LAB — CREATININE, FLUID (PLEURAL, PERITONEAL, JP DRAINAGE): Creat, Fluid: 1.3 mg/dL

## 2013-10-03 MED ORDER — DSS 100 MG PO CAPS: 100.0000 mg | ORAL_CAPSULE | Freq: Two times a day (BID) | ORAL | Status: DC

## 2013-10-03 MED ORDER — HYDROCODONE-ACETAMINOPHEN 5-325 MG PO TABS
1.0000 | ORAL_TABLET | Freq: Four times a day (QID) | ORAL | Status: DC | PRN
  Administered 2013-10-03: 2 via ORAL
  Administered 2013-10-04: 1 via ORAL
  Filled 2013-10-03 (×2): qty 2

## 2013-10-03 MED ORDER — BISACODYL 10 MG RE SUPP
10.0000 mg | Freq: Once | RECTAL | Status: AC
  Administered 2013-10-03: 10 mg via RECTAL
  Filled 2013-10-03: qty 1

## 2013-10-03 MED ORDER — HYDROCODONE-ACETAMINOPHEN 5-325 MG PO TABS: 1.0000 | ORAL_TABLET | Freq: Four times a day (QID) | ORAL | Status: DC | PRN

## 2013-10-03 NOTE — Progress Notes (Signed)
Patient ID: Krystal Reynolds, female   DOB: [birth year], 43 y.o.   MRN: [id number]  1 Day Post-Op Subjective: The patient is doing well.  No nausea or vomiting. Pain is adequately controlled.  Objective: Vital signs in last 24 hours: Temp:  [97.8 F (36.6 C)-98.7 F (37.1 C)] 98.7 F (37.1 C) (06/05 0436) Pulse Rate:  [61-87] 62 (06/05 0436) Resp:  [13-20] 18 (06/05 0436) BP: (136-196)/(67-97) 136/67 mmHg (06/05 0436) SpO2:  [96 %-100 %] 97 % (06/05 0436) Weight:  [116.475 kg (256 lb 12.5 oz)] 116.475 kg (256 lb 12.5 oz) (06/04 1158)  Intake/Output from previous day: 06/04 0701 - 06/05 0700 In: 5295 [P.O.:450; I.V.:4185; IV Piggyback:400] Out: 1320 [Urine:1090; Drains:130; Blood:100] Intake/Output this shift:    Physical Exam:  General: Alert and oriented. CV: RRR Lungs: Clear bilaterally. GI: Soft, Nondistended. Incisions: Clean and dry. Urine: Clear Extremities: Nontender, no erythema, no edema.  Lab Results:  Recent Labs  10/02/13 1048 10/03/13 0419  HGB 14.2 13.7  HCT 40.7 40.2          Recent Labs  10/02/13 1048 10/03/13 0419  CREATININE 1.05 1.26*           Results for orders placed during the hospital encounter of 10/02/13 (from the past 24 hour(s))  BASIC METABOLIC PANEL     Status: Abnormal   Collection Time    10/02/13 10:48 AM      Result Value Ref Range   Sodium 136 (*) 137 - 147 mEq/L   Potassium 4.6  3.7 - 5.3 mEq/L   Chloride 99  96 - 112 mEq/L   CO2 25  19 - 32 mEq/L   Glucose, Bld 150 (*) 70 - 99 mg/dL   BUN 14  6 - 23 mg/dL   Creatinine, Ser 1.05  0.50 - 1.10 mg/dL   Calcium 9.1  8.4 - 10.5 mg/dL   GFR calc non Af Amer 65 (*) >90 mL/min   GFR calc Af Amer 75 (*) >90 mL/min  HEMOGLOBIN AND HEMATOCRIT, BLOOD     Status: None   Collection Time    10/02/13 10:48 AM      Result Value Ref Range   Hemoglobin 14.2  12.0 - 15.0 g/dL   HCT 40.7  36.0 - 84.6 %  BASIC METABOLIC PANEL     Status: Abnormal   Collection Time    10/03/13  4:19  AM      Result Value Ref Range   Sodium 138  137 - 147 mEq/L   Potassium 4.6  3.7 - 5.3 mEq/L   Chloride 100  96 - 112 mEq/L   CO2 26  19 - 32 mEq/L   Glucose, Bld 148 (*) 70 - 99 mg/dL   BUN 13  6 - 23 mg/dL   Creatinine, Ser 1.26 (*) 0.50 - 1.10 mg/dL   Calcium 9.2  8.4 - 10.5 mg/dL   GFR calc non Af Amer 52 (*) >90 mL/min   GFR calc Af Amer 60 (*) >90 mL/min  HEMOGLOBIN AND HEMATOCRIT, BLOOD     Status: None   Collection Time    10/03/13  4:19 AM      Result Value Ref Range   Hemoglobin 13.7  12.0 - 15.0 g/dL   HCT 40.2  36.0 - 46.0 %    Assessment/Plan: POD# 1 s/p robotic partial nephrectomy.  1) Ambulate, Incentive spirometry 2) Advance diet as tolerated 3) Transition to oral pain medication 4) Dulcolax suppository 5) D/C urethral catheter  Pryor Curia. MD   LOS: 1 day   Raynelle Bring 10/03/2013, 7:36 AM

## 2013-10-03 NOTE — Care Management Note (Signed)
    Page 1 of 1   10/03/2013     2:14:56 PM CARE MANAGEMENT NOTE 10/03/2013  Patient:  Krystal Reynolds, Krystal Reynolds   Account Number:  [id number]  Date Initiated:  10/03/2013  Documentation initiated by:  Dessa Phi  Subjective/Objective Assessment:   43 Y/O F ADMITTED W/RENAL NEOPLASM.     Action/Plan:   FROM HOME.   Anticipated DC Date:  10/04/2013   Anticipated DC Plan:  [place]  CM consult      Choice offered to / List presented to:             Status of service:  In process, will continue to follow Medicare Important Message given?   (If response is "NO", the following Medicare IM given date fields will be blank) Date Medicare IM given:   Date Additional Medicare IM given:    Discharge Disposition:    Per UR Regulation:  Reviewed for med. necessity/level of care/duration of stay  If discussed at [place] of Stay Meetings, dates discussed:    Comments:  10/03/13 Yamna Mackel RN,BSN NCM [phone] POD#1 L PARTIAL NEPHRECTOMY.NO ANTICIPATED D/C NEEDS.

## 2013-10-03 NOTE — Progress Notes (Signed)
Patient ID: Krystal Reynolds, female   DOB: [birth year], 43 y.o.   MRN: [id number]  Pt doing well.  Eating regular diet and ambulating.  Will SL IVF. 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  She has tested negative for VHL but will require continued close imaging and long term surveillance of her kidneys in the future.  Will check JP Cr and SL IVF.  Plan for d/c in am if still doing well.

## 2013-10-03 NOTE — Progress Notes (Signed)
Patient, POD 1 foley removed and voiding with out complaints. Pt has also ambulated in halls x3 and tolerating well.  J.Tamya Denardo, RN

## 2013-10-04 LAB — BASIC METABOLIC PANEL
BUN: 17 mg/dL (ref 6–23)
CHLORIDE: 103 meq/L (ref 96–112)
CO2: 27 mEq/L (ref 19–32)
Calcium: 9 mg/dL (ref 8.4–10.5)
Creatinine, Ser: 1.14 mg/dL — ABNORMAL HIGH (ref 0.50–1.10)
GFR calc Af Amer: 68 mL/min — ABNORMAL LOW (ref >90)
GFR calc non Af Amer: 59 mL/min — ABNORMAL LOW (ref >90)
GLUCOSE: 87 mg/dL (ref 70–99)
POTASSIUM: 4.2 meq/L (ref 3.7–5.3)
SODIUM: 141 meq/L (ref 137–147)

## 2013-10-04 NOTE — Progress Notes (Signed)
Pt d/c home with family, d/c instructions reviewed with pt, pt verbalized understanding.

## 2013-10-04 NOTE — Discharge Summary (Signed)
Date of admission: 10/02/2013  Date of discharge: 10/04/2013  Admission diagnosis: Left Renal mass  Discharge diagnosis: Status post left robotic-assisted partial nephrectomy for renal cell carcinoma. Secondary diagnoses:  Patient Active Problem List   Diagnosis Date Noted  . Renal neoplasm 10/02/2013  . Cancer   . Renal mass 12/07/2011  . Pancreatic head mass 10/30/2011  . Morbid obesity 09/07/2011    History and Physical: For full details, please see admission history and physical. Briefly, Krystal Reynolds is a 43 y.o. year old patient with a mass detected on her left kidney during surveillance for her right-sided previously resected kidney cancer.  Hospital Course: Patient tolerated the procedure well.  She was then transferred to the floor after an uneventful PACU stay.  Her hospital course was uncomplicated.  On POD#2 she had met discharge criteria: was eating a regular diet, was up and ambulating independently,  pain was well controlled, was voiding without a catheter, and was ready to for discharge. Her JP drain was removed prior to discharge.   Laboratory values:   Recent Labs  10/02/13 1048 10/03/13 0419  HGB 14.2 13.7  HCT 40.7 40.2    Recent Labs  10/02/13 1048 10/03/13 0419 10/04/13 0540  NA 136* 138 141  K 4.6 4.6 4.2  CL 99 100 103  CO2 _0 GLUCOSE 150* 148* 87  BUN _1 CREATININE 1.05 1.26* 1.14*  CALCIUM 9.1 9.2 9.0   No results found for this basename: LABPT, INR,  in the last 72 hours No results found for this basename: LABURIN,  in the last 72 hours Results for orders placed during the hospital encounter of 01/02/12  SURGICAL PCR SCREEN     Status: None   Collection Time    01/02/12  1:33 PM      Result Value Ref Range Status   MRSA, PCR NEGATIVE  NEGATIVE Final   Staphylococcus aureus NEGATIVE  NEGATIVE Final   Comment:            The Xpert SA Assay (FDA     approved for NASAL specimens     in patients over 42 years of age),     is  one component of     a comprehensive surveillance     program.  Test performance has     been validated by [employer] for patients greater     than or equal to 19 year old.     It is not intended     to diagnose infection nor to     guide or monitor treatment.    Disposition: Home  Discharge instruction: The patient was instructed to be ambulatory but told to refrain from heavy lifting, strenuous activity, or driving.  Discharge medications:    Medication List    STOP taking these medications       aspirin-acetaminophen-caffeine 250-250-65 MG per tablet  Commonly known as:  EXCEDRIN MIGRAINE     ibuprofen 200 MG tablet  Commonly known as:  ADVIL,MOTRIN      TAKE these medications       cetirizine 10 MG tablet  Commonly known as:  ZYRTEC  Take 10 mg by mouth daily.     citalopram 20 MG tablet  Commonly known as:  CELEXA  Take 20 mg by mouth daily with breakfast.     DSS 100 MG Caps  Take 100 mg by mouth 2 (two) times daily.  HYDROcodone-acetaminophen 5-325 MG per tablet  Commonly known as:  NORCO/VICODIN  Take 1-2 tablets by mouth every 6 (six) hours as needed for moderate pain (pain).        Followup:      Follow-up Information   Follow up with Dutch Gray, MD On 10/22/2013. (12:30)    Specialty:  Urology   Contact information:   [place] [address] [phone]

## 2013-10-04 NOTE — Progress Notes (Signed)
Patient ID: Krystal Reynolds, female   DOB: 07/19/1970, 43 y.o.   MRN: [id number]  2 Days Post-Op Subjective: The patient is doing well.  No nausea or vomiting. Pain is adequately controlled. Tolerating regular diet. Voiding without issue.  Objective: Vital signs in last 24 hours: Temp:  [97.7 F (36.5 C)-98.6 F (37 C)] 98.6 F (37 C) (06/06 0430) Pulse Rate:  [66-72] 69 (06/06 0430) Resp:  [14-18] 18 (06/06 0430) BP: (134-156)/(67-82) 140/70 mmHg (06/06 0430) SpO2:  [96 %-98 %] 97 % (06/06 0430)  Intake/Output from previous day: 06/05 0701 - 06/06 0700 In: 2358.8 [P.O.:840; I.V.:1518.8] Out: 6967 [Urine:1800; Drains:45] Intake/Output this shift:    Physical Exam:  General: Alert and oriented. GI: Soft, Nondistended. Incisions: Clean and dry. JP was serosanguineous drainage Extremities: Nontender, no erythema, no edema.  Lab Results:  Recent Labs  10/02/13 1048 10/03/13 0419  HGB 14.2 13.7  HCT 40.7 40.2           Recent Labs  10/02/13 1048 10/03/13 0419 10/04/13 0540  CREATININE 1.05 1.26* 1.14*   JP drain creatinine: 1.3        Results for orders placed during the hospital encounter of 10/02/13 (from the past 24 hour(s))  CREATININE, BODY FLUID     Status: None   Collection Time    10/03/13  8:04 PM      Result Value Ref Range   Creat, Fluid 1.3     Fluid Type-FCRE JP DRAINAGE    BASIC METABOLIC PANEL     Status: Abnormal   Collection Time    10/04/13  5:40 AM      Result Value Ref Range   Sodium 141  137 - 147 mEq/L   Potassium 4.2  3.7 - 5.3 mEq/L   Chloride 103  96 - 112 mEq/L   CO2 27  19 - 32 mEq/L   Glucose, Bld 87  70 - 99 mg/dL   BUN 17  6 - 23 mg/dL   Creatinine, Ser 1.14 (*) 0.50 - 1.10 mg/dL   Calcium 9.0  8.4 - 10.5 mg/dL   GFR calc non Af Amer 59 (*) >90 mL/min   GFR calc Af Amer 68 (*) >90 mL/min    Assessment/Plan: POD# 2 s/p robotic partial nephrectomy, patient doing very well..  Plan is to remove the JP drain today and send  the patient home. She's been given pain medication and I went over discharge instructions briefly. She's been scheduled for followup Dr. Alinda Money in the office in the next couple weeks.   LOS: 2 days   Ardis Hughs 10/04/2013, 10:26 AM

## 2014-05-08 ENCOUNTER — Ambulatory Visit (HOSPITAL_COMMUNITY)
Admission: RE | Admit: 2014-05-08 | Discharge: 2014-05-08 | Disposition: A | Discharge status: Home / Self Care | Payer: BC Managed Care – PPO | Source: Ambulatory Visit | Attending: Urology | Admitting: Urology

## 2014-05-08 ENCOUNTER — Other Ambulatory Visit (HOSPITAL_COMMUNITY): Payer: Self-pay | Admitting: Urology

## 2014-05-08 DIAGNOSIS — R05 Cough: Secondary | ICD-10-CM | POA: Diagnosis not present

## 2014-05-08 DIAGNOSIS — C649 Malignant neoplasm of unspecified kidney, except renal pelvis: Secondary | ICD-10-CM

## 2014-12-11 ENCOUNTER — Ambulatory Visit (HOSPITAL_COMMUNITY)
Admission: RE | Admit: 2014-12-11 | Discharge: 2014-12-11 | Disposition: A | Discharge status: Home / Self Care | Payer: BC Managed Care – PPO | Source: Ambulatory Visit | Attending: Urology | Admitting: Urology

## 2014-12-11 ENCOUNTER — Other Ambulatory Visit (HOSPITAL_COMMUNITY): Payer: Self-pay | Admitting: Urology

## 2014-12-11 DIAGNOSIS — C649 Malignant neoplasm of unspecified kidney, except renal pelvis: Secondary | ICD-10-CM | POA: Diagnosis present

## 2014-12-11 DIAGNOSIS — J984 Other disorders of lung: Secondary | ICD-10-CM | POA: Diagnosis not present

## 2015-06-16 ENCOUNTER — Other Ambulatory Visit (HOSPITAL_COMMUNITY): Payer: Self-pay | Admitting: Urology

## 2015-06-16 ENCOUNTER — Ambulatory Visit (HOSPITAL_COMMUNITY)
Admission: RE | Admit: 2015-06-16 | Discharge: 2015-06-16 | Disposition: A | Discharge status: Home / Self Care | Payer: BC Managed Care – PPO | Source: Ambulatory Visit | Attending: Urology | Admitting: Urology

## 2015-06-16 DIAGNOSIS — C649 Malignant neoplasm of unspecified kidney, except renal pelvis: Secondary | ICD-10-CM | POA: Insufficient documentation

## 2016-01-14 ENCOUNTER — Ambulatory Visit (HOSPITAL_COMMUNITY)
Admission: RE | Admit: 2016-01-14 | Discharge: 2016-01-14 | Disposition: A | Discharge status: Home / Self Care | Payer: BC Managed Care – PPO | Source: Ambulatory Visit | Attending: Urology | Admitting: Urology

## 2016-01-14 ENCOUNTER — Other Ambulatory Visit (HOSPITAL_COMMUNITY): Payer: Self-pay | Admitting: Urology

## 2016-01-14 DIAGNOSIS — Z85528 Personal history of other malignant neoplasm of kidney: Secondary | ICD-10-CM

## 2016-01-20 ENCOUNTER — Telehealth: Payer: Self-pay | Admitting: Gastroenterology

## 2016-01-20 NOTE — Telephone Encounter (Signed)
CT has been faxed to our office for review, pt had EUS in 2013, MRI 2014 and 2015 without changes to the pancreatic cyst.  Please advise as to scheduling EUS or office visit?

## 2016-01-25 NOTE — Telephone Encounter (Signed)
Please call her.  The cyst has only minor growth in 3-4 years of surveillance.  Previous EUS FNA showed that this is not a pre-cancerous type of cyst.  12/12 appt is fine, also put on wait list and call her in sooner if anything opens up.  Thanks

## 2016-01-27 NOTE — Telephone Encounter (Signed)
Krystal Reynolds has been notified at [facility] that 12/12 appt is fine, she will call with any questions.

## 2016-04-11 ENCOUNTER — Telehealth: Payer: Self-pay | Admitting: Gastroenterology

## 2016-04-11 ENCOUNTER — Encounter: Payer: Self-pay | Admitting: Gastroenterology

## 2016-04-11 ENCOUNTER — Ambulatory Visit (INDEPENDENT_AMBULATORY_CARE_PROVIDER_SITE_OTHER): Payer: BC Managed Care – PPO | Admitting: Gastroenterology

## 2016-04-11 VITALS — BP 118/70 | HR 62 | Ht 66.25 in | Wt 210.0 lb

## 2016-04-11 DIAGNOSIS — K862 Cyst of pancreas: Secondary | ICD-10-CM

## 2016-04-11 NOTE — Telephone Encounter (Signed)
Pt was given the number for radiology and will call and get a time that works for her.

## 2016-04-11 NOTE — Progress Notes (Signed)
Review of pertinent gastrointestinal problems: 1. Incidental pancreatic cyst:  cystic lesion pancreatic head noted 09/2011; EUS 4.1cm multiloculated without concerning mophrology; Cytology negative CEA less than 0.2ng/mL Amylase 42 U/L;  EUS 09/2011 66mm max MRI 01/2013: 56mm by 58mm;  MRI 07/2013 34mm by 70mm.   CT 11/2014: 38mm by 90mm by 39mm.  CT 12/2015: 1mm by 54mm by 72mm.     HPI: This is a  very pleasant 45 year old woman   who was referred to me by Dr. Raynelle Bring for follow-up of pancreatic cyst  Chief complaint is incidental pancreatic cyst  She gets follow up body CTs for renal cell cancer protocol  No GI issues;   no abdominal pain, no unintentional weight loss, no unusual bowel habits.  Has gained 10 pounds in past year  No pancreatic cancer in her family.  Review of systems: Pertinent positive and negative review of systems were noted in the above HPI section. Complete review of systems was performed and was otherwise normal.   Past Medical History:  Diagnosis Date  . Anemia 01-02-12   mostly low iron- tx. oral meds, not in several yrs  . Cancer ([place]) 2013, 2015   clear cell renal cell carcinoma  . Chronic kidney disease 01-02-12   dx. rt. kidney neoplasm-surgery planned, past hx. protein in urine.  . Depression   . Family history of anesthesia complication    mother slow to wake up   . Headache(784.0) 01-02-12   constant pain level headache "2-3"  . Hypertension   . Leg swelling 01-02-12   occ. episodes of pitting edema lower extremities at end of day.  . Preeclampsia 1998   first pregnancy - not currently pregnant  . Sleep apnea 01-02-12   does not use cpap   . Thyroid enlargement 01-02-12   left thyroid enlargement, being watched, no tx.    Past Surgical History:  Procedure Laterality Date  . ABDOMINAL HYSTERECTOMY  2008  . BREATH TEK H PYLORI  09/20/2011   Procedure: BREATH TEK H PYLORI;  Surgeon: Shann Medal, MD;  Location: [facility];  Service:  General;  Laterality: N/A;  . childbirth  01-02-12   x2 NVD- pre-exclampsia with pregrancy  . EUS  12/07/2011   Procedure: UPPER ENDOSCOPIC ULTRASOUND (EUS) LINEAR;  Surgeon: Milus Banister, MD;  Location: [facility];  Service: Endoscopy;  Laterality: N/A;  radial linear  . right partial nephrectomy     . ROBOTIC ASSITED PARTIAL NEPHRECTOMY Left 10/02/2013   Procedure: ROBOTIC ASSITED PARTIAL NEPHRECTOMY;  Surgeon: Raynelle Bring, MD;  Location: [facility];  Service: Urology;  Laterality: Left;    Current Outpatient Prescriptions  Medication Sig Dispense Refill  . cetirizine (ZYRTEC) 10 MG tablet Take 10 mg by mouth as needed.     . citalopram (CELEXA) 20 MG tablet Take 20 mg by mouth daily with breakfast.      No current facility-administered medications for this visit.     Allergies as of 04/11/2016  . (No Known Allergies)    Family History  Problem Relation Age of Onset  . Cancer Paternal Uncle     lung  . AAA (abdominal aortic aneurysm) Maternal Grandfather     60s-70s    Social History   Social History  . Marital status: Married    Spouse name: N/A  . Number of children: 2  . Years of education: N/A   Occupational History  . teacher [employer] History Main Topics  .  Smoking status: Never Smoker  . Smokeless tobacco: Never Used  . Alcohol use Yes     Comment: rarely  . Drug use: No  . Sexual activity: Yes   Other Topics Concern  . Not on file   Social History Narrative  . No narrative on file     Physical Exam: BP 118/70   Pulse 62   Ht 5' 6.25" (1.683 m)   Wt 210 lb (95.3 kg)   BMI 33.64 kg/m  Constitutional: generally well-appearing Psychiatric: alert and oriented x3 Eyes: extraocular movements intact Mouth: oral pharynx moist, no lesions Neck: supple no lymphadenopathy Cardiovascular: heart regular rate and rhythm Lungs: clear to auscultation bilaterally Abdomen: soft, nontender, nondistended, no obvious ascites, no  peritoneal signs, normal bowel sounds Extremities: no lower extremity edema bilaterally Skin: no lesions on visible extremities   Assessment and plan: 45 y.o. 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 Current AGA guidelines for pancreatic incidental cysts suggest MRI based follow-up about every other year for a total of 5 years. If no significant changes or unusual morphology then no need for further imaging. Current criteria for high risk features are size greater than 3 cm, abnormal associated pancreatic duct, associated solid, enhancing nodules. Guidelines state need at least 2 of these features to warrant invasive testing.  I think it is unlikely she will ever be clinically bothered by this cyst. I recommended repeat MRI of pancreas with MRCP images, her last was about 2 years ago. If this shows no significant change in morphology and no concerning features. Then I will likely recommend repeat MRI based imaging again in 2 years and if at that point there are no problems that we can forego further surveillance.   Owens Loffler, MD [facility] 04/11/2016, 9:25 AM  Cc: Jasmine Awe., MD

## 2016-04-11 NOTE — Patient Instructions (Addendum)
MRI pancreas with MRCP for pancreatic cyst follow up.  You have been scheduled for an MRI at [facility] on 04/20/16. Your appointment time is 9 am. Please arrive 15 minutes prior to your appointment time for registration purposes. Please make certain not to have anything to eat or drink 6 hours prior to your test. In addition, if you have any metal in your body, have a pacemaker or defibrillator, please be sure to let your ordering physician know. This test typically takes 45 minutes to 1 hour to complete.

## 2016-04-20 ENCOUNTER — Ambulatory Visit (HOSPITAL_COMMUNITY): Payer: BC Managed Care – PPO

## 2016-04-21 ENCOUNTER — Ambulatory Visit (HOSPITAL_COMMUNITY)
Admission: RE | Admit: 2016-04-21 | Discharge: 2016-04-21 | Disposition: A | Discharge status: Home / Self Care | Payer: BC Managed Care – PPO | Source: Ambulatory Visit | Attending: Gastroenterology | Admitting: Gastroenterology

## 2016-04-21 ENCOUNTER — Other Ambulatory Visit: Payer: Self-pay | Admitting: Gastroenterology

## 2016-04-21 DIAGNOSIS — K862 Cyst of pancreas: Secondary | ICD-10-CM

## 2016-04-21 LAB — POCT I-STAT CREATININE: Creatinine, Ser: 0.8 mg/dL (ref 0.44–1.00)

## 2016-04-21 MED ORDER — GADOBENATE DIMEGLUMINE 529 MG/ML IV SOLN
20.0000 mL | Freq: Once | INTRAVENOUS | Status: AC | PRN
  Administered 2016-04-21: 19 mL via INTRAVENOUS

## 2016-04-26 ENCOUNTER — Other Ambulatory Visit: Payer: Self-pay

## 2016-04-26 DIAGNOSIS — K862 Cyst of pancreas: Secondary | ICD-10-CM

## 2016-04-26 NOTE — Telephone Encounter (Signed)
[facility] Dr Barry Dienes appt 06/05/16 9 am arrive 8:30 am

## 2016-04-26 NOTE — Progress Notes (Signed)
Pt has been notified and referral to [place].

## 2016-04-26 NOTE — Telephone Encounter (Signed)
Pt.notified

## 2016-08-30 NOTE — Telephone Encounter (Signed)
Error

## 2016-10-31 ENCOUNTER — Other Ambulatory Visit: Payer: Self-pay | Admitting: General Surgery

## 2016-11-15 ENCOUNTER — Encounter (HOSPITAL_COMMUNITY)
Admission: RE | Admit: 2016-11-15 | Discharge: 2016-11-15 | Disposition: A | Discharge status: Home / Self Care | Payer: BC Managed Care – PPO | Source: Ambulatory Visit | Attending: General Surgery | Admitting: General Surgery

## 2016-11-15 ENCOUNTER — Encounter (HOSPITAL_COMMUNITY): Payer: Self-pay

## 2016-11-15 DIAGNOSIS — Z01812 Encounter for preprocedural laboratory examination: Secondary | ICD-10-CM | POA: Diagnosis present

## 2016-11-15 HISTORY — DX: Gestational (pregnancy-induced) hypertension without significant proteinuria, unspecified trimester: O13.9

## 2016-11-15 LAB — CBC WITH DIFFERENTIAL/PLATELET
BASOS ABS: 0 10*3/uL (ref 0.0–0.1)
BASOS PCT: 0 %
EOS ABS: 0.1 10*3/uL (ref 0.0–0.7)
EOS PCT: 1 %
HCT: 42.6 % (ref 36.0–46.0)
HEMOGLOBIN: 14.6 g/dL (ref 12.0–15.0)
Lymphocytes Relative: 17 %
Lymphs Abs: 1.6 10*3/uL (ref 0.7–4.0)
MCH: 29.7 pg (ref 26.0–34.0)
MCHC: 34.3 g/dL (ref 30.0–36.0)
MCV: 86.8 fL (ref 78.0–100.0)
Monocytes Absolute: 0.5 10*3/uL (ref 0.1–1.0)
Monocytes Relative: 6 %
NEUTROS PCT: 76 %
Neutro Abs: 7 10*3/uL (ref 1.7–7.7)
PLATELETS: 273 10*3/uL (ref 150–400)
RBC: 4.91 MIL/uL (ref 3.87–5.11)
RDW: 12.7 % (ref 11.5–15.5)
WBC: 9.3 10*3/uL (ref 4.0–10.5)

## 2016-11-15 LAB — PROTIME-INR
INR: 0.97
PROTHROMBIN TIME: 12.9 s (ref 11.4–15.2)

## 2016-11-15 LAB — PREPARE RBC (CROSSMATCH)

## 2016-11-15 LAB — COMPREHENSIVE METABOLIC PANEL
ALBUMIN: 3.9 g/dL (ref 3.5–5.0)
ALK PHOS: 72 U/L (ref 38–126)
ALT: 21 U/L (ref 14–54)
AST: 22 U/L (ref 15–41)
Anion gap: 5 (ref 5–15)
BUN: 13 mg/dL (ref 6–20)
CALCIUM: 9.1 mg/dL (ref 8.9–10.3)
CHLORIDE: 104 mmol/L (ref 101–111)
CO2: 26 mmol/L (ref 22–32)
CREATININE: 0.89 mg/dL (ref 0.44–1.00)
GFR calc Af Amer: >60 mL/min (ref >60)
GFR calc non Af Amer: >60 mL/min (ref >60)
Glucose, Bld: 96 mg/dL (ref 65–99)
Potassium: 4.2 mmol/L (ref 3.5–5.1)
SODIUM: 135 mmol/L (ref 135–145)
Total Bilirubin: 1.1 mg/dL (ref 0.3–1.2)
Total Protein: 7.3 g/dL (ref 6.5–8.1)

## 2016-11-15 LAB — ABO/RH: ABO/RH(D): O POS

## 2016-11-15 NOTE — Progress Notes (Signed)
Anesthesia PAT Evaluation: Patient is a 46 year old female scheduled for [facility] Procedure on 11/23/16 by Dr. Barry Dienes. Dx: Cystic mass on pancreas. 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  History includes clear cell renal cell carcinoma s/p right partial nephrectomy 01/08/12 and s/p left partial nephrectomy 10/02/13, OSA (no CPAP; improved after weight loss), never smoker, thyromegaly, headaches, anemia, depression, hysterectomy, PIH/preeclampsia '98. BMI is consistent with obesity.  PCP is listed as Dr. Laverda Sorenson (OB-GYN). Urologist is Dr. Dutch Gray. GI is Dr. Owens Loffler.  Meds include Celexa.  BP 136/75   Pulse 69   Temp 36.6 C   Resp 20   Ht 5\' 7"  (1.702 m)   Wt 222 lb 8 oz (100.9 kg)   SpO2 100%   BMI 34.85 kg/m  Patient is a pleasant Caucasian female in NAD. Heart RRR. No murmur appreciated. No carotid bruits noted. 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 She denied dysphagia, orthopnea, SOB. No history of difficult airway.). Lungs clear. Mallampati II.   Preoperative labs noted.   Discussed epidural for post-operative pain management. She had two prior epidurals with childbirth. She denied prior back surgery, blood thinners, valvular disease. I do not see any contraindication for epidural. Her anesthesiologist will discuss further with her on the day of surgery.   George Hugh [facility] Short Stay Center/Anesthesiology Phone [phone] 11/15/2016 1:50 PM

## 2016-11-15 NOTE — Pre-Procedure Instructions (Addendum)
Krystal Reynolds  11/15/2016      [place], [place], [address] [name] [address] Phone: [phone] Fax: [phone]    Your procedure is scheduled on 11/23/16.  Report to [facility] Admitting at 530 A.M.  Call this number if you have problems the morning of surgery:  [phone]   Remember:  Do not eat food or drink liquids after midnight.  Take these medicines the morning of surgery with A SIP OF WATER      Citalopam(celexa)  STOP all herbel meds, nsaids (aleve,naproxen,advil,ibuprofen) 7 days prior to surgery starting 11/16/16 tomorrow including all vitamins/supplements, aspirin  Bowel prep instructions per dr   Lazaro Arms not wear jewelry, make-up or nail polish.  Do not wear lotions, powders, or perfumes, or deoderant.  Do not shave 48 hours prior to surgery.  Men may shave face and neck.  Do not bring valuables to the hospital.  [facility] is not responsible for any belongings or valuables.  Contacts, dentures or bridgework may not be worn into surgery.  Leave your suitcase in the car.  After surgery it may be brought to your room.  For patients admitted to the hospital, discharge time will be determined by your treatment team.  Patients discharged the day of surgery will not be allowed to drive home.   Special instructions:   Special Instructions: [place] - Preparing for Surgery  Before surgery, you can play an important role.  Because skin is not sterile, your skin needs to be as free of germs as possible.  You can reduce the number of germs on you skin by washing with CHG (chlorahexidine gluconate) soap before surgery.  CHG is an antiseptic cleaner which kills germs and bonds with the skin to continue killing germs even after washing.  Please DO NOT use if you have an allergy to CHG or antibacterial soaps.  If your skin becomes reddened/irritated stop using the CHG and inform your  nurse when you arrive at Short Stay.  Do not shave (including legs and underarms) for at least 48 hours prior to the first CHG shower.  You may shave your face.  Please follow these instructions carefully:   1.  Shower with CHG Soap the night before surgery and the morning of Surgery.  2.  If you choose to wash your hair, wash your hair first as usual with your normal shampoo.  3.  After you shampoo, rinse your hair and body thoroughly to remove the Shampoo.  4.  Use CHG as you would any other liquid soap.  You can apply chg directly  to the skin and wash gently with scrungie or a clean washcloth.  5.  Apply the CHG Soap to your body ONLY FROM THE NECK DOWN.  Do not use on open wounds or open sores.  Avoid contact with your eyes ears, mouth and genitals (private parts).  Wash genitals (private parts)       with your normal soap.  6.  Wash thoroughly, paying special attention to the area where your surgery will be performed.  7.  Thoroughly rinse your body with warm water from the neck down.  8.  DO NOT shower/wash with your normal soap after using and rinsing off the CHG Soap.  9.  Pat yourself dry with a clean towel.            10.  Wear clean pajamas.  11.  Place clean sheets on your bed the night of your first shower and do not sleep with pets.  Day of Surgery  Do not apply any lotions/deodorants the morning of surgery.  Please wear clean clothes to the hospital/surgery center.  Please read over the fact sheets that you were given.

## 2016-11-22 ENCOUNTER — Encounter (HOSPITAL_COMMUNITY): Payer: Self-pay | Admitting: Anesthesiology

## 2016-11-22 MED ORDER — CEFAZOLIN SODIUM-DEXTROSE 2-4 GM/100ML-% IV SOLN
2.0000 g | INTRAVENOUS | Status: AC
  Administered 2016-11-23 (×2): 2 g via INTRAVENOUS

## 2016-11-22 NOTE — Anesthesia Preprocedure Evaluation (Addendum)
Anesthesia Evaluation  Patient identified by MRN, date of birth, ID band Patient awake    Reviewed: Allergy & Precautions, NPO status , Patient's Chart, lab work & pertinent test results  Airway Mallampati: I       Dental no notable dental hx. (+) Teeth Intact   Pulmonary    Pulmonary exam normal breath sounds clear to auscultation       Cardiovascular hypertension, Normal cardiovascular exam Rhythm:Regular Rate:Normal     Neuro/Psych negative neurological ROS     GI/Hepatic negative GI ROS, Neg liver ROS,   Endo/Other  Morbid obesity  Renal/GU Renal cell Ca. Normal renal function     Musculoskeletal negative musculoskeletal ROS (+)   Abdominal (+) + obese,   Peds  Hematology   Anesthesia Other Findings   Reproductive/Obstetrics                            Anesthesia Physical Anesthesia Plan  ASA: II  Anesthesia Plan: General and Epidural   Post-op Pain Management:    Induction: Intravenous  PONV Risk Score and Plan: 4 or greater and Ondansetron, Dexamethasone, Propofol, Midazolam and Scopolamine patch - Pre-op  Airway Management Planned: Oral ETT  Additional Equipment: Arterial line  Intra-op Plan:   Post-operative Plan: Extubation in OR  Informed Consent: I have reviewed the patients History and Physical, chart, labs and discussed the procedure including the risks, benefits and alternatives for the proposed anesthesia with the patient or authorized representative who has indicated his/her understanding and acceptance.   Dental advisory given  Plan Discussed with: CRNA and Surgeon  Anesthesia Plan Comments:        Anesthesia Quick Evaluation

## 2016-11-22 NOTE — H&P (Signed)
Krystal Reynolds Location: [facility] Surgery Patient #: [id number] DOB: [birth year] Married / Language: English / Race: White Female   History of Present Illness  The patient is a 46 year old female who presents with a pancreatic mass. Pt is a 46 yo F referred for consultation by Dr. Ardis Hughs for reevaluation of a cystic pancreatic mass. I saw her back in 2013. She had a cystic pancreatic mass at that time. EUS was done and CEA and amylase were low. 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 She has had surveillance imaging, and the mass has grown significantly in size. She has had an increase from 3.3 cm to 4.9 cm in greatest dimension (2015-2017). She denies abdominal pain, jaundice, nausea vomiting, diarrhea, flushing, weight loss. 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 She has liberally lost some weight since I saw her in 2013. She is doing well with a 33 year old daughter who plays college softball and a 53 year old son who plays baseball and football. She is a Diplomatic Services operational officer for [employer].  MRI 04/21/16 IMPRESSION: 1. Interval growth of multilocular cystic pancreatic head mass, now 4.9 x 3.5 x 4.1 cm, which otherwise demonstrates no high risk imaging features and is most likely to represent a serous cystadenoma. No pancreatic duct dilation. Given the continued growth and size > 4 cm, multidisciplinary consultation is advised for consideration of repeat EUS/FNA and/or surgical management. 2. No evidence of local tumor recurrence at the partial nephrectomy sites in the lower right and upper left kidneys. No evidence of metastatic disease in the abdomen. 3. Two subcentimeter T2 hyperintense renal cortical lesions, too small to accurately characterize, without appreciable enhancement, suggesting simple renal cysts.   Past Surgical History  Cesarean Section - Multiple   Hysterectomy (not due to cancer) - Partial  Nephrectomy  Bilateral.  Diagnostic Studies History Colonoscopy  never Mammogram  within last year Pap Smear  1-5 years ago  Allergies No Known Drug Allergies 06/05/2016  Medication History  Citalopram Hydrobromide (20MG  Tablet, Oral daily) Active. ZyrTEC Allergy (10MG  Tablet, Oral as needed) Active. Medications Reconciled  Social History Alcohol use  Occasional alcohol use. Caffeine use  Coffee. No drug use  Tobacco use  Never smoker.  Family History Diabetes Mellitus  Father. Hypertension  Brother, Father.  Pregnancy / Birth History Age at menarche  51 years. Contraceptive History  Intrauterine device, Oral contraceptives. Gravida  2 Length (months) of breastfeeding  3-6 Maternal age  35-25 Para  2  Other Problems Cancer  High blood pressure  Other disease, cancer, significant illness     Review of Systems General Not Present- Appetite Loss, Chills, Fatigue, Fever, Night Sweats, Weight Gain and Weight Loss. Skin Not Present- Change in Wart/Mole, Dryness, Hives, Jaundice, New Lesions, Non-Healing Wounds, Rash and Ulcer. HEENT Present- Wears glasses/contact lenses. Not Present- Earache, Hearing Loss, Hoarseness, Nose Bleed, Oral Ulcers, Ringing in the Ears, Seasonal Allergies, Sinus Pain, Sore Throat, Visual Disturbances and Yellow Eyes. Respiratory Not Present- Bloody sputum, Chronic Cough, Difficulty Breathing, Snoring and Wheezing. Breast Not Present- Breast Mass, Breast Pain, Nipple Discharge and Skin Changes. Cardiovascular Not Present- Chest Pain, Difficulty Breathing Lying Down, Leg Cramps, Palpitations, Rapid Heart Rate, Shortness of Breath and Swelling of Extremities. Gastrointestinal Not Present- Abdominal Pain, Bloating, Bloody Stool, Change in Bowel Habits, Chronic diarrhea, Constipation, Difficulty Swallowing, Excessive gas, Gets full quickly at meals, Hemorrhoids, Indigestion, Nausea,  Rectal Pain and  Vomiting. Female Genitourinary Not Present- Frequency, Nocturia, Painful Urination, Pelvic Pain and Urgency. Musculoskeletal Not Present- Back Pain, Joint Pain, Joint Stiffness, Muscle Pain, Muscle Weakness and Swelling of Extremities. Neurological Not Present- Decreased Memory, Fainting, Headaches, Numbness, Seizures, Tingling, Tremor, Trouble walking and Weakness. Psychiatric Not Present- Anxiety, Bipolar, Change in Sleep Pattern, Depression, Fearful and Frequent crying. Endocrine Not Present- Cold Intolerance, Excessive Hunger, Hair Changes, Heat Intolerance, Hot flashes and New Diabetes. Hematology Not Present- Blood Thinners, Easy Bruising, Excessive bleeding, Gland problems, HIV and Persistent Infections.  Vitals Weight: 211.8 lb Height: 66.5in Height was reported by patient. Body Surface Area: 2.06 m Body Mass Index: 33.67 kg/m  Temp.: 98.54F  Pulse: 69 (Regular)  BP: 132/90 (Sitting, Right Arm, Standard)     Physical Exam General Mental Status-Alert. General Appearance-Consistent with stated age. Hydration-Well hydrated. Voice-Normal.  Head and Neck Head-normocephalic, atraumatic with no lesions or palpable masses. Trachea-midline. Thyroid Gland Characteristics - normal size and consistency.  Eye Eyeball - Bilateral-Extraocular movements intact. Sclera/Conjunctiva - Bilateral-No scleral icterus.  Chest and Lung Exam Chest and lung exam reveals -quiet, even and easy respiratory effort with no use of accessory muscles and on auscultation, normal breath sounds, no adventitious sounds and normal vocal resonance. Inspection Chest Wall - Normal. Back - normal.  Cardiovascular Cardiovascular examination reveals -normal heart sounds, regular rate and rhythm with no murmurs and normal pedal pulses bilaterally.  Abdomen Inspection Inspection of the abdomen reveals - No Hernias. Palpation/Percussion Palpation and Percussion  of the abdomen reveal - Soft, Non Tender, No Rebound tenderness, No Rigidity (guarding) and No hepatosplenomegaly. Auscultation Auscultation of the abdomen reveals - Bowel sounds normal.  Neurologic Neurologic evaluation reveals -alert and oriented x 3 with no impairment of recent or remote memory. Mental Status-Normal.  Musculoskeletal Global Assessment -Note: no gross deformities.  Normal Exam - Left-Upper Extremity Strength Normal and Lower Extremity Strength Normal. Normal Exam - Right-Upper Extremity Strength Normal and Lower Extremity Strength Normal.  Lymphatic Head & Neck  General Head & Neck Lymphatics: Bilateral - Description - Normal. Axillary  General Axillary Region: Bilateral - Description - Normal. Tenderness - Non Tender. Femoral & Inguinal  Generalized Femoral & Inguinal Lymphatics: Bilateral - Description - No Generalized lymphadenopathy.    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 She is currently asymptomatic, but the lesion has grown significantly. Also, given her history of clear cell cancer of the kidney, it makes sense to take it out.  I reviewed Whipple and surgical risks. Her husband is present.  I discussed risks of death, prolonged hospitalization, delayed gastric emptying, bleeding possibly requiring blood transfusion, etc. Current Plans Pt Education - flb whipple pt info You are being scheduled for surgery- Our schedulers will call you.  You should hear from our office's scheduling department within 5 working days about the location, date, and time of surgery. We try to make accommodations for patient's preferences in scheduling surgery, but sometimes the OR schedule or the surgeon's schedule prevents [place] from making those accommodations.  If you have not heard from our office [phone]) in 5 working days, call the office and ask for your surgeon's nurse.  If you have  other questions about your diagnosis, plan, or surgery, call the office and ask for your surgeon's nurse.     Signed by Stark Klein, MD

## 2016-11-23 ENCOUNTER — Inpatient Hospital Stay (HOSPITAL_COMMUNITY): Payer: BC Managed Care – PPO | Admitting: Certified Registered"

## 2016-11-23 ENCOUNTER — Encounter (HOSPITAL_COMMUNITY)
Admission: RE | Disposition: A | Discharge status: Home Health | Payer: Self-pay | Source: Ambulatory Visit | Attending: General Surgery

## 2016-11-23 ENCOUNTER — Encounter (HOSPITAL_COMMUNITY): Payer: Self-pay | Admitting: Urology

## 2016-11-23 ENCOUNTER — Inpatient Hospital Stay (HOSPITAL_COMMUNITY)
Admission: RE | Admit: 2016-11-23 | Discharge: 2016-12-09 | DRG: 406 | Disposition: A | Discharge status: Home Health | Payer: BC Managed Care – PPO | Source: Ambulatory Visit | Attending: General Surgery | Admitting: General Surgery

## 2016-11-23 ENCOUNTER — Inpatient Hospital Stay (HOSPITAL_COMMUNITY): Payer: BC Managed Care – PPO | Admitting: Vascular Surgery

## 2016-11-23 DIAGNOSIS — Z6833 Body mass index (BMI) 33.0-33.9, adult: Secondary | ICD-10-CM | POA: Diagnosis not present

## 2016-11-23 DIAGNOSIS — D72829 Elevated white blood cell count, unspecified: Secondary | ICD-10-CM

## 2016-11-23 DIAGNOSIS — K862 Cyst of pancreas: Secondary | ICD-10-CM | POA: Diagnosis present

## 2016-11-23 DIAGNOSIS — I10 Essential (primary) hypertension: Secondary | ICD-10-CM | POA: Diagnosis present

## 2016-11-23 DIAGNOSIS — Z85528 Personal history of other malignant neoplasm of kidney: Secondary | ICD-10-CM

## 2016-11-23 DIAGNOSIS — R739 Hyperglycemia, unspecified: Secondary | ICD-10-CM | POA: Diagnosis not present

## 2016-11-23 DIAGNOSIS — E871 Hypo-osmolality and hyponatremia: Secondary | ICD-10-CM | POA: Diagnosis not present

## 2016-11-23 DIAGNOSIS — Z90710 Acquired absence of both cervix and uterus: Secondary | ICD-10-CM | POA: Diagnosis not present

## 2016-11-23 DIAGNOSIS — R0602 Shortness of breath: Secondary | ICD-10-CM

## 2016-11-23 DIAGNOSIS — E669 Obesity, unspecified: Secondary | ICD-10-CM | POA: Diagnosis present

## 2016-11-23 DIAGNOSIS — Z8249 Family history of ischemic heart disease and other diseases of the circulatory system: Secondary | ICD-10-CM | POA: Diagnosis not present

## 2016-11-23 DIAGNOSIS — Z905 Acquired absence of kidney: Secondary | ICD-10-CM | POA: Diagnosis not present

## 2016-11-23 DIAGNOSIS — K8689 Other specified diseases of pancreas: Secondary | ICD-10-CM | POA: Diagnosis present

## 2016-11-23 DIAGNOSIS — R0902 Hypoxemia: Secondary | ICD-10-CM

## 2016-11-23 HISTORY — PX: WHIPPLE PROCEDURE: SHX2667

## 2016-11-23 HISTORY — PX: CHOLECYSTECTOMY: SHX55

## 2016-11-23 LAB — CREATININE, SERUM: Creatinine, Ser: 1.06 mg/dL — ABNORMAL HIGH (ref 0.44–1.00)

## 2016-11-23 LAB — CBC
HEMATOCRIT: 36.9 % (ref 36.0–46.0)
Hemoglobin: 12.5 g/dL (ref 12.0–15.0)
MCH: 29.2 pg (ref 26.0–34.0)
MCHC: 33.9 g/dL (ref 30.0–36.0)
MCV: 86.2 fL (ref 78.0–100.0)
Platelets: 282 10*3/uL (ref 150–400)
RBC: 4.28 MIL/uL (ref 3.87–5.11)
RDW: 12.8 % (ref 11.5–15.5)
WBC: 23.3 10*3/uL — AB (ref 4.0–10.5)

## 2016-11-23 LAB — PREPARE RBC (CROSSMATCH)

## 2016-11-23 SURGERY — WHIPPLE PROCEDURE
Anesthesia: Epidural | Site: Abdomen

## 2016-11-23 MED ORDER — DIPHENHYDRAMINE HCL 12.5 MG/5ML PO ELIX: 12.5000 mg | ORAL_SOLUTION | Freq: Four times a day (QID) | ORAL | Status: DC | PRN

## 2016-11-23 MED ORDER — LIDOCAINE 2% (20 MG/ML) 5 ML SYRINGE
INTRAMUSCULAR | Status: AC
  Filled 2016-11-23: qty 5

## 2016-11-23 MED ORDER — LIDOCAINE-EPINEPHRINE (PF) 2 %-1:200000 IJ SOLN
INTRAMUSCULAR | Status: DC | PRN
  Administered 2016-11-23 (×3): 5 mL
  Administered 2016-11-23: 20 mL
  Administered 2016-11-23: 5 mL

## 2016-11-23 MED ORDER — FENTANYL CITRATE (PF) 250 MCG/5ML IJ SOLN
INTRAMUSCULAR | Status: AC
  Filled 2016-11-23: qty 5

## 2016-11-23 MED ORDER — LIDOCAINE HCL (CARDIAC) 20 MG/ML IV SOLN
INTRAVENOUS | Status: DC | PRN
  Administered 2016-11-23: 100 mg via INTRAVENOUS

## 2016-11-23 MED ORDER — MIDAZOLAM HCL 2 MG/2ML IJ SOLN
INTRAMUSCULAR | Status: AC
  Filled 2016-11-23: qty 2

## 2016-11-23 MED ORDER — ROCURONIUM BROMIDE 10 MG/ML (PF) SYRINGE
PREFILLED_SYRINGE | INTRAVENOUS | Status: AC
  Filled 2016-11-23: qty 5

## 2016-11-23 MED ORDER — PHENYLEPHRINE HCL 10 MG/ML IJ SOLN
INTRAMUSCULAR | Status: DC | PRN
  Administered 2016-11-23: 80 ug via INTRAVENOUS

## 2016-11-23 MED ORDER — EPHEDRINE SULFATE 50 MG/ML IJ SOLN
INTRAMUSCULAR | Status: DC | PRN
  Administered 2016-11-23 (×4): 5 mg via INTRAVENOUS
  Administered 2016-11-23: 10 mg via INTRAVENOUS

## 2016-11-23 MED ORDER — EPHEDRINE 5 MG/ML INJ
INTRAVENOUS | Status: AC
  Filled 2016-11-23: qty 10

## 2016-11-23 MED ORDER — DEXTROSE 5 % IV SOLN
INTRAVENOUS | Status: DC | PRN
  Administered 2016-11-23: 50 ug/min via INTRAVENOUS

## 2016-11-23 MED ORDER — DEXAMETHASONE SODIUM PHOSPHATE 4 MG/ML IJ SOLN
INTRAMUSCULAR | Status: DC | PRN
  Administered 2016-11-23: 10 mg via INTRAVENOUS

## 2016-11-23 MED ORDER — PROMETHAZINE HCL 25 MG/ML IJ SOLN: 6.2500 mg | INTRAMUSCULAR | Status: DC | PRN

## 2016-11-23 MED ORDER — LACTATED RINGERS IV SOLN
INTRAVENOUS | Status: DC | PRN
  Administered 2016-11-23 (×4): via INTRAVENOUS

## 2016-11-23 MED ORDER — HYDROMORPHONE HCL 1 MG/ML IJ SOLN: 0.2500 mg | INTRAMUSCULAR | Status: DC | PRN

## 2016-11-23 MED ORDER — METHOCARBAMOL 1000 MG/10ML IJ SOLN
500.0000 mg | Freq: Four times a day (QID) | INTRAVENOUS | Status: DC | PRN
  Filled 2016-11-23: qty 5

## 2016-11-23 MED ORDER — FENTANYL CITRATE (PF) 100 MCG/2ML IJ SOLN
INTRAMUSCULAR | Status: DC | PRN
  Administered 2016-11-23 (×3): 50 ug via INTRAVENOUS
  Administered 2016-11-23: 100 ug via INTRAVENOUS
  Administered 2016-11-23 (×2): 50 ug via INTRAVENOUS
  Administered 2016-11-23: 100 ug via INTRAVENOUS

## 2016-11-23 MED ORDER — SUGAMMADEX SODIUM 500 MG/5ML IV SOLN
INTRAVENOUS | Status: AC
  Filled 2016-11-23: qty 5

## 2016-11-23 MED ORDER — DEXTROSE 5 % IV SOLN: INTRAVENOUS | Status: DC | PRN

## 2016-11-23 MED ORDER — DIPHENHYDRAMINE HCL 12.5 MG/5ML PO ELIX
12.5000 mg | ORAL_SOLUTION | Freq: Four times a day (QID) | ORAL | Status: DC | PRN
  Administered 2016-11-25 – 2016-12-01 (×2): 12.5 mg via ORAL
  Filled 2016-11-23: qty 10
  Filled 2016-11-23: qty 5

## 2016-11-23 MED ORDER — ONDANSETRON HCL 4 MG/2ML IJ SOLN
INTRAMUSCULAR | Status: AC
  Filled 2016-11-23: qty 2

## 2016-11-23 MED ORDER — PANTOPRAZOLE SODIUM 40 MG IV SOLR
40.0000 mg | Freq: Every day | INTRAVENOUS | Status: DC
  Administered 2016-11-23 – 2016-12-06 (×14): 40 mg via INTRAVENOUS
  Filled 2016-11-23 (×14): qty 40

## 2016-11-23 MED ORDER — ONDANSETRON HCL 4 MG/2ML IJ SOLN
4.0000 mg | Freq: Four times a day (QID) | INTRAMUSCULAR | Status: DC | PRN
  Administered 2016-11-23 – 2016-11-24 (×2): 4 mg via INTRAVENOUS
  Filled 2016-11-23 (×2): qty 2

## 2016-11-23 MED ORDER — MIDAZOLAM HCL 5 MG/5ML IJ SOLN
INTRAMUSCULAR | Status: DC | PRN
Start: 2016-11-23 — End: 2016-11-23
  Administered 2016-11-23: 2 mg via INTRAVENOUS

## 2016-11-23 MED ORDER — EVICEL 5 ML EX KIT
PACK | CUTANEOUS | Status: AC
  Filled 2016-11-23: qty 1

## 2016-11-23 MED ORDER — 0.9 % SODIUM CHLORIDE (POUR BTL) OPTIME
TOPICAL | Status: DC | PRN
  Administered 2016-11-23 (×2): 1000 mL

## 2016-11-23 MED ORDER — HYDRALAZINE HCL 20 MG/ML IJ SOLN: 10.0000 mg | INTRAMUSCULAR | Status: DC | PRN

## 2016-11-23 MED ORDER — ONDANSETRON HCL 4 MG/2ML IJ SOLN: 4.0000 mg | Freq: Four times a day (QID) | INTRAMUSCULAR | Status: DC | PRN

## 2016-11-23 MED ORDER — KCL IN DEXTROSE-NACL 20-5-0.45 MEQ/L-%-% IV SOLN
INTRAVENOUS | Status: DC
  Administered 2016-11-23 – 2016-11-29 (×17): via INTRAVENOUS
  Administered 2016-11-29: 75 mL/h via INTRAVENOUS
  Administered 2016-11-30: 06:00:00 via INTRAVENOUS
  Filled 2016-11-23 (×22): qty 1000

## 2016-11-23 MED ORDER — ORAL CARE MOUTH RINSE
15.0000 mL | Freq: Two times a day (BID) | OROMUCOSAL | Status: DC
  Administered 2016-11-23 – 2016-12-07 (×11): 15 mL via OROMUCOSAL

## 2016-11-23 MED ORDER — HYDROMORPHONE 1 MG/ML IV SOLN
INTRAVENOUS | Status: AC
  Filled 2016-11-23: qty 25

## 2016-11-23 MED ORDER — ACETAMINOPHEN 10 MG/ML IV SOLN
1000.0000 mg | Freq: Four times a day (QID) | INTRAVENOUS | Status: AC
  Administered 2016-11-23 – 2016-11-24 (×4): 1000 mg via INTRAVENOUS
  Filled 2016-11-23 (×3): qty 100

## 2016-11-23 MED ORDER — HYDROMORPHONE HCL 1 MG/ML IJ SOLN
0.5000 mg | INTRAMUSCULAR | Status: DC | PRN
  Administered 2016-11-23: 2 mg via INTRAVENOUS
  Administered 2016-11-26: 0.5 mg via INTRAVENOUS
  Administered 2016-11-28 – 2016-12-06 (×2): 2 mg via INTRAVENOUS
  Filled 2016-11-23: qty 0.5
  Filled 2016-11-23 (×3): qty 2

## 2016-11-23 MED ORDER — ONDANSETRON 4 MG PO TBDP: 4.0000 mg | ORAL_TABLET | Freq: Four times a day (QID) | ORAL | Status: DC | PRN

## 2016-11-23 MED ORDER — ALBUMIN HUMAN 5 % IV SOLN
INTRAVENOUS | Status: DC | PRN
  Administered 2016-11-23: 11:00:00 via INTRAVENOUS

## 2016-11-23 MED ORDER — ONDANSETRON HCL 4 MG/2ML IJ SOLN
INTRAMUSCULAR | Status: DC | PRN
  Administered 2016-11-23: 4 mg via INTRAVENOUS

## 2016-11-23 MED ORDER — NALOXONE HCL 0.4 MG/ML IJ SOLN
0.4000 mg | INTRAMUSCULAR | Status: DC | PRN
Start: 2016-11-23 — End: 2016-12-06

## 2016-11-23 MED ORDER — PHENYLEPHRINE 40 MCG/ML (10ML) SYRINGE FOR IV PUSH (FOR BLOOD PRESSURE SUPPORT)
PREFILLED_SYRINGE | INTRAVENOUS | Status: AC
  Filled 2016-11-23: qty 10

## 2016-11-23 MED ORDER — DEXAMETHASONE SODIUM PHOSPHATE 10 MG/ML IJ SOLN
INTRAMUSCULAR | Status: AC
  Filled 2016-11-23: qty 1

## 2016-11-23 MED ORDER — CEFAZOLIN SODIUM-DEXTROSE 2-4 GM/100ML-% IV SOLN
2.0000 g | Freq: Three times a day (TID) | INTRAVENOUS | Status: AC
  Administered 2016-11-23: 2 g via INTRAVENOUS
  Filled 2016-11-23: qty 100

## 2016-11-23 MED ORDER — BUPIVACAINE HCL (PF) 0.25 % IJ SOLN
INTRAMUSCULAR | Status: DC | PRN
  Administered 2016-11-23: 10 mL via EPIDURAL

## 2016-11-23 MED ORDER — DIPHENHYDRAMINE HCL 50 MG/ML IJ SOLN
12.5000 mg | Freq: Four times a day (QID) | INTRAMUSCULAR | Status: DC | PRN
  Administered 2016-11-25: 12.5 mg via INTRAVENOUS
  Filled 2016-11-23: qty 1

## 2016-11-23 MED ORDER — SODIUM CHLORIDE 0.9 % IV SOLN: Freq: Once | INTRAVENOUS | Status: DC

## 2016-11-23 MED ORDER — CEFAZOLIN SODIUM 1 G IJ SOLR
INTRAMUSCULAR | Status: AC
  Filled 2016-11-23: qty 20

## 2016-11-23 MED ORDER — PROPOFOL 10 MG/ML IV BOLUS
INTRAVENOUS | Status: DC | PRN
  Administered 2016-11-23: 200 mg via INTRAVENOUS

## 2016-11-23 MED ORDER — DIPHENHYDRAMINE HCL 50 MG/ML IJ SOLN: 12.5000 mg | Freq: Four times a day (QID) | INTRAMUSCULAR | Status: DC | PRN

## 2016-11-23 MED ORDER — CHLORHEXIDINE GLUCONATE CLOTH 2 % EX PADS: 6.0000 | MEDICATED_PAD | Freq: Once | CUTANEOUS | Status: DC

## 2016-11-23 MED ORDER — KCL IN DEXTROSE-NACL 20-5-0.45 MEQ/L-%-% IV SOLN
INTRAVENOUS | Status: AC
  Filled 2016-11-23: qty 1000

## 2016-11-23 MED ORDER — ENOXAPARIN SODIUM 40 MG/0.4ML ~~LOC~~ SOLN: 40.0000 mg | SUBCUTANEOUS | Status: DC

## 2016-11-23 MED ORDER — MEPERIDINE HCL 25 MG/ML IJ SOLN: 6.2500 mg | INTRAMUSCULAR | Status: DC | PRN

## 2016-11-23 MED ORDER — PROPOFOL 10 MG/ML IV BOLUS
INTRAVENOUS | Status: AC
  Filled 2016-11-23: qty 20

## 2016-11-23 MED ORDER — EVICEL 5 ML EX KIT
PACK | CUTANEOUS | Status: DC | PRN
  Administered 2016-11-23: 5 mL

## 2016-11-23 MED ORDER — SODIUM CHLORIDE 0.9% FLUSH: 9.0000 mL | INTRAVENOUS | Status: DC | PRN

## 2016-11-23 MED ORDER — ROCURONIUM BROMIDE 100 MG/10ML IV SOLN
INTRAVENOUS | Status: DC | PRN
  Administered 2016-11-23: 20 mg via INTRAVENOUS
  Administered 2016-11-23: 60 mg via INTRAVENOUS
  Administered 2016-11-23: 40 mg via INTRAVENOUS
  Administered 2016-11-23: 30 mg via INTRAVENOUS
  Administered 2016-11-23: 20 mg via INTRAVENOUS

## 2016-11-23 MED ORDER — HYDROMORPHONE 1 MG/ML IV SOLN
INTRAVENOUS | Status: DC
  Administered 2016-11-23: 1.8 mg via INTRAVENOUS
  Administered 2016-11-23: 0.3 mg via INTRAVENOUS
  Administered 2016-11-23: 14:00:00 via INTRAVENOUS
  Administered 2016-11-24: 4.5 mg via INTRAVENOUS
  Administered 2016-11-24: 1.5 mg via INTRAVENOUS
  Administered 2016-11-24: 0.9 mg via INTRAVENOUS
  Administered 2016-11-24: 1.8 mg via INTRAVENOUS
  Administered 2016-11-24: 3.6 mg via INTRAVENOUS
  Administered 2016-11-24: 2.4 mg via INTRAVENOUS
  Administered 2016-11-25: 08:00:00 via INTRAVENOUS
  Administered 2016-11-25: 5.1 mg via INTRAVENOUS
  Administered 2016-11-25: 1.2 mg via INTRAVENOUS
  Administered 2016-11-25: 2.4 mg via INTRAVENOUS
  Administered 2016-11-25: 2.7 mg via INTRAVENOUS
  Administered 2016-11-25: 1.2 mg via INTRAVENOUS
  Administered 2016-11-26: 22:00:00 via INTRAVENOUS
  Administered 2016-11-26: 2.1 mg via INTRAVENOUS
  Administered 2016-11-26 – 2016-11-27 (×2): 2.4 mg via INTRAVENOUS
  Administered 2016-11-27: 3.6 mg via INTRAVENOUS
  Administered 2016-11-27: 4.2 mg via INTRAVENOUS
  Administered 2016-11-27: 5.1 mg via INTRAVENOUS
  Administered 2016-11-28: 05:00:00 via INTRAVENOUS
  Administered 2016-11-28: 5.1 mg via INTRAVENOUS
  Administered 2016-11-28: 2.7 mg via INTRAVENOUS
  Administered 2016-11-28: 4.2 mg via INTRAVENOUS
  Administered 2016-11-28: 2.7 mg via INTRAVENOUS
  Administered 2016-11-28 (×2): 2.1 mg via INTRAVENOUS
  Administered 2016-11-29: 4.5 mg via INTRAVENOUS
  Administered 2016-11-29: 3.6 mg via INTRAVENOUS
  Administered 2016-11-29: 1.8 mg via INTRAVENOUS
  Administered 2016-11-29: 3.9 mg via INTRAVENOUS
  Administered 2016-11-29: 3.6 mg via INTRAVENOUS
  Administered 2016-11-30 (×2): 3 mg via INTRAVENOUS
  Administered 2016-11-30: 1.5 mg via INTRAVENOUS
  Administered 2016-11-30: 2.7 mg via INTRAVENOUS
  Administered 2016-12-01: 3 mg via INTRAVENOUS
  Administered 2016-12-01: 1.8 mg via INTRAVENOUS
  Administered 2016-12-01: 3.3 mg via INTRAVENOUS
  Administered 2016-12-01: 2.4 mg via INTRAVENOUS
  Administered 2016-12-01: 1.2 mg via INTRAVENOUS
  Administered 2016-12-01: 6 mg via INTRAVENOUS
  Administered 2016-12-02: 4.5 mg via INTRAVENOUS
  Administered 2016-12-02: 4.2 mg via INTRAVENOUS
  Administered 2016-12-02: 3.9 mg via INTRAVENOUS
  Administered 2016-12-02: 2.4 mg via INTRAVENOUS
  Administered 2016-12-02: 3.9 mg via INTRAVENOUS
  Administered 2016-12-02: 3 mg via INTRAVENOUS
  Administered 2016-12-03 (×2): 2.7 mg via INTRAVENOUS
  Administered 2016-12-03: 3.3 mg via INTRAVENOUS
  Administered 2016-12-03: 2.4 mg via INTRAVENOUS
  Administered 2016-12-03: 11 mg via INTRAVENOUS
  Administered 2016-12-04: 2.4 mg via INTRAVENOUS
  Administered 2016-12-04: 15 mg via INTRAVENOUS
  Administered 2016-12-04: 2.4 mg via INTRAVENOUS
  Administered 2016-12-04: 3.3 mg via INTRAVENOUS
  Administered 2016-12-04: 17:00:00 via INTRAVENOUS
  Administered 2016-12-04: 4.8 mg via INTRAVENOUS
  Administered 2016-12-05: 3.3 mg via INTRAVENOUS
  Administered 2016-12-05: 3 mg via INTRAVENOUS
  Administered 2016-12-05: 2.4 mg via INTRAVENOUS
  Administered 2016-12-06: 1.2 mg via INTRAVENOUS
  Administered 2016-12-06: 2.7 mg via INTRAVENOUS
  Administered 2016-12-06: 0.9 mg via INTRAVENOUS
  Filled 2016-11-23 (×9): qty 25

## 2016-11-23 MED ORDER — ACETAMINOPHEN 10 MG/ML IV SOLN
INTRAVENOUS | Status: AC
  Filled 2016-11-23: qty 100

## 2016-11-23 MED ORDER — KETOROLAC TROMETHAMINE 30 MG/ML IJ SOLN: 30.0000 mg | Freq: Once | INTRAMUSCULAR | Status: DC | PRN

## 2016-11-23 MED ORDER — SODIUM CHLORIDE 0.9 % IV SOLN
INTRAVENOUS | Status: DC
  Administered 2016-11-23 – 2016-11-27 (×9): via EPIDURAL
  Filled 2016-11-23 (×22): qty 25

## 2016-11-23 MED ORDER — SUGAMMADEX SODIUM 200 MG/2ML IV SOLN
INTRAVENOUS | Status: DC | PRN
  Administered 2016-11-23: 500 mg via INTRAVENOUS

## 2016-11-23 MED ORDER — PHENYLEPHRINE 40 MCG/ML (10ML) SYRINGE FOR IV PUSH (FOR BLOOD PRESSURE SUPPORT)
PREFILLED_SYRINGE | INTRAVENOUS | Status: DC | PRN
  Administered 2016-11-23 (×2): 80 ug via INTRAVENOUS
  Administered 2016-11-23: 40 ug via INTRAVENOUS
  Administered 2016-11-23: 80 ug via INTRAVENOUS

## 2016-11-23 MED ORDER — SODIUM CHLORIDE 0.9 % IJ SOLN
INTRAMUSCULAR | Status: AC
  Filled 2016-11-23: qty 10

## 2016-11-23 SURGICAL SUPPLY — 111 items
BAG BILE T-TUBES STRL (MISCELLANEOUS) ×6 IMPLANT
BLADE CLIPPER SURG (BLADE) IMPLANT
BOOT SUTURE AID YELLOW STND (SUTURE) ×6 IMPLANT
CANISTER SUCT 3000ML PPV (MISCELLANEOUS) ×3 IMPLANT
CATH KIT ON Q 7.5IN SLV (PAIN MANAGEMENT) IMPLANT
CATH ROBINSON RED A/P 16FR (CATHETERS) IMPLANT
CHLORAPREP W/TINT 26ML (MISCELLANEOUS) ×3 IMPLANT
CLIP VESOCCLUDE LG 6/CT (CLIP) ×3 IMPLANT
CLIP VESOCCLUDE MED 24/CT (CLIP) ×3 IMPLANT
CLIP VESOLOCK LG 6/CT PURPLE (CLIP) ×9 IMPLANT
CLIP VESOLOCK MED 6/CT (CLIP) ×3 IMPLANT
CLIP VESOLOCK MED LG 6/CT (CLIP) ×3 IMPLANT
CONT SPEC 4OZ CLIKSEAL STRL BL (MISCELLANEOUS) ×6 IMPLANT
COVER MAYO STAND STRL (DRAPES) ×3 IMPLANT
COVER SURGICAL LIGHT HANDLE (MISCELLANEOUS) ×3 IMPLANT
DRAIN CHANNEL 19F RND (DRAIN) ×6 IMPLANT
DRAIN PENROSE 1/2X36 STERILE (WOUND CARE) IMPLANT
DRAPE LAPAROSCOPIC ABDOMINAL (DRAPES) ×3 IMPLANT
DRAPE WARM FLUID 44X44 (DRAPE) ×3 IMPLANT
DRSG COVADERM 4X10 (GAUZE/BANDAGES/DRESSINGS) IMPLANT
DRSG COVADERM 4X14 (GAUZE/BANDAGES/DRESSINGS) ×3 IMPLANT
DRSG COVADERM 4X6 (GAUZE/BANDAGES/DRESSINGS) IMPLANT
DRSG COVADERM 4X8 (GAUZE/BANDAGES/DRESSINGS) IMPLANT
DRSG TELFA 3X8 NADH (GAUZE/BANDAGES/DRESSINGS) IMPLANT
ELECT BLADE 4.0 EZ CLEAN MEGAD (MISCELLANEOUS) ×3
ELECT BLADE 6.5 EXT (BLADE) ×3 IMPLANT
ELECT CAUTERY BLADE 6.4 (BLADE) ×3 IMPLANT
ELECT REM PT RETURN 9FT ADLT (ELECTROSURGICAL) ×3
ELECTRODE BLDE 4.0 EZ CLN MEGD (MISCELLANEOUS) ×1 IMPLANT
ELECTRODE REM PT RTRN 9FT ADLT (ELECTROSURGICAL) ×1 IMPLANT
GAUZE SPONGE 4X4 12PLY STRL (GAUZE/BANDAGES/DRESSINGS) ×3 IMPLANT
GAUZE SPONGE 4X4 16PLY XRAY LF (GAUZE/BANDAGES/DRESSINGS) IMPLANT
GEL ULTRASOUND 20GR AQUASONIC (MISCELLANEOUS) IMPLANT
GLOVE BIO SURGEON STRL SZ 6 (GLOVE) ×3 IMPLANT
GLOVE BIO SURGEON STRL SZ 6.5 (GLOVE) ×8 IMPLANT
GLOVE BIO SURGEON STRL SZ7 (GLOVE) ×12 IMPLANT
GLOVE BIO SURGEON STRL SZ7.5 (GLOVE) IMPLANT
GLOVE BIO SURGEON STRL SZ8 (GLOVE) ×3 IMPLANT
GLOVE BIO SURGEONS STRL SZ 6.5 (GLOVE) ×4
GLOVE BIOGEL PI IND STRL 6 (GLOVE) ×1 IMPLANT
GLOVE BIOGEL PI IND STRL 7.0 (GLOVE) ×4 IMPLANT
GLOVE BIOGEL PI IND STRL 8 (GLOVE) IMPLANT
GLOVE BIOGEL PI IND STRL 8.5 (GLOVE) ×1 IMPLANT
GLOVE BIOGEL PI INDICATOR 6 (GLOVE) ×2
GLOVE BIOGEL PI INDICATOR 7.0 (GLOVE) ×8
GLOVE BIOGEL PI INDICATOR 8 (GLOVE)
GLOVE BIOGEL PI INDICATOR 8.5 (GLOVE) ×2
GLOVE INDICATOR 6.5 STRL GRN (GLOVE) ×3 IMPLANT
GLOVE SURG SS PI 6.0 STRL IVOR (GLOVE) ×3 IMPLANT
GOWN STRL REUS W/ TWL LRG LVL3 (GOWN DISPOSABLE) ×3 IMPLANT
GOWN STRL REUS W/ TWL XL LVL3 (GOWN DISPOSABLE) IMPLANT
GOWN STRL REUS W/TWL 2XL LVL3 (GOWN DISPOSABLE) ×12 IMPLANT
GOWN STRL REUS W/TWL LRG LVL3 (GOWN DISPOSABLE) ×6
GOWN STRL REUS W/TWL XL LVL3 (GOWN DISPOSABLE) ×3 IMPLANT
HEMOSTAT SURGICEL 2X14 (HEMOSTASIS) IMPLANT
KIT BASIN OR (CUSTOM PROCEDURE TRAY) ×3 IMPLANT
KIT MARKER MARGIN INK (KITS) IMPLANT
KIT ROOM TURNOVER OR (KITS) ×3 IMPLANT
KIT TOURNIQUET VASCULAR (KITS) IMPLANT
LOOP VESSEL MAXI BLUE (MISCELLANEOUS) ×3 IMPLANT
LOOP VESSEL MINI RED (MISCELLANEOUS) ×3 IMPLANT
NEEDLE BIOPSY 14X6 SOFT TISS (NEEDLE) IMPLANT
NS IRRIG 1000ML POUR BTL (IV SOLUTION) ×6 IMPLANT
PACK GENERAL/GYN (CUSTOM PROCEDURE TRAY) ×3 IMPLANT
PAD ARMBOARD 7.5X6 YLW CONV (MISCELLANEOUS) ×6 IMPLANT
PAD SHARPS MAGNETIC DISPOSAL (MISCELLANEOUS) IMPLANT
PLUG CATH AND CAP STER (CATHETERS) IMPLANT
RELOAD PROXIMATE 75MM BLUE (ENDOMECHANICALS) ×9 IMPLANT
RELOAD PROXIMATE 75MM GREEN (ENDOMECHANICALS) IMPLANT
RELOAD STAPLER LINE PROX 30 GR (STAPLE) ×1 IMPLANT
SEPRAFILM PROCEDURAL PACK 3X5 (MISCELLANEOUS) IMPLANT
SHEARS FOC LG CVD HARMONIC 17C (MISCELLANEOUS) ×3 IMPLANT
SPONGE INTESTINAL PEANUT (DISPOSABLE) IMPLANT
SPONGE LAP 18X18 X RAY DECT (DISPOSABLE) ×21 IMPLANT
SPONGE SURGIFOAM ABS GEL 100 (HEMOSTASIS) IMPLANT
STAPLER PROXIMATE 75MM BLUE (STAPLE) ×3 IMPLANT
STAPLER RELOAD LINE PROX 30 GR (STAPLE) ×3
STAPLER VISISTAT 35W (STAPLE) ×3 IMPLANT
SUCTION POOLE TIP (SUCTIONS) ×3 IMPLANT
SUT 5.0 PDS RB-1 (SUTURE) ×12
SUT ETHILON 2 0 FS 18 (SUTURE) ×9 IMPLANT
SUT ETHILON 2 LR (SUTURE) IMPLANT
SUT PDS AB 1 TP1 96 (SUTURE) ×9 IMPLANT
SUT PDS AB 3-0 SH 27 (SUTURE) ×12 IMPLANT
SUT PDS AB 4-0 RB1 27 (SUTURE) ×24 IMPLANT
SUT PDS PLUS AB 5-0 RB-1 (SUTURE) ×6 IMPLANT
SUT PROLENE 3 0 SH 48 (SUTURE) ×12 IMPLANT
SUT PROLENE 4 0 RB 1 (SUTURE) ×6
SUT PROLENE 4-0 RB1 .5 CRCL 36 (SUTURE) ×3 IMPLANT
SUT PROLENE 5 0 RB 1 DA (SUTURE) ×6 IMPLANT
SUT SILK 2 0 SH CR/8 (SUTURE) ×6 IMPLANT
SUT SILK 2 0 TIES 10X30 (SUTURE) ×3 IMPLANT
SUT SILK 3 0 SH CR/8 (SUTURE) ×6 IMPLANT
SUT SILK 3 0 TIES 10X30 (SUTURE) ×3 IMPLANT
SUT VIC AB 2-0 CT1 27 (SUTURE)
SUT VIC AB 2-0 CT1 TAPERPNT 27 (SUTURE) IMPLANT
SUT VIC AB 2-0 SH 18 (SUTURE) ×3 IMPLANT
SUT VIC AB 2-0 SH 27 (SUTURE) ×2
SUT VIC AB 2-0 SH 27X BRD (SUTURE) ×1 IMPLANT
SUT VIC AB 3-0 SH 18 (SUTURE) ×3 IMPLANT
SUT VIC AB 3-0 SH 27 (SUTURE) ×2
SUT VIC AB 3-0 SH 27X BRD (SUTURE) ×1 IMPLANT
SUT VICRYL AB 2 0 TIES (SUTURE) IMPLANT
SYR 10ML LL (SYRINGE) ×3 IMPLANT
TAPE UMBILICAL 1/8 X36 TWILL (MISCELLANEOUS) IMPLANT
TOWEL OR 17X24 6PK STRL BLUE (TOWEL DISPOSABLE) IMPLANT
TOWEL OR 17X26 10 PK STRL BLUE (TOWEL DISPOSABLE) ×3 IMPLANT
TRAY FOLEY W/METER SILVER 14FR (SET/KITS/TRAYS/PACK) ×3 IMPLANT
TUBE FEEDING 8FR 16IN STR KANG (MISCELLANEOUS) ×3 IMPLANT
TUBE FEEDING ENTERAL 5FR 16IN (TUBING) ×3 IMPLANT
TUNNELER SHEATH ON-Q 16GX12 DP (PAIN MANAGEMENT) IMPLANT

## 2016-11-23 NOTE — Progress Notes (Signed)
Pt transferred from PACU to 4N29 after report.

## 2016-11-23 NOTE — Anesthesia Procedure Notes (Signed)
Procedure Name: Intubation Date/Time: 11/23/2016 7:58 AM Performed by: Ollen Bowl Pre-anesthesia Checklist: Patient identified, Emergency Drugs available, Suction available and Patient being monitored Patient Re-evaluated:Patient Re-evaluated prior to induction Oxygen Delivery Method: Circle system utilized Preoxygenation: Pre-oxygenation with 100% oxygen Induction Type: IV induction Ventilation: Oral airway inserted - appropriate to patient size and Two handed mask ventilation required Laryngoscope Size: Miller and 2 Grade View: Grade I Tube type: Oral Tube size: 7.0 mm Number of attempts: 1 Airway Equipment and Method: Stylet Secured at: 23 cm Tube secured with: Tape Dental Injury: Teeth and Oropharynx as per pre-operative assessment  Comments: Imagene Riches, [place]

## 2016-11-23 NOTE — Progress Notes (Signed)
Lunch relief by S. Gregson RN 

## 2016-11-23 NOTE — Anesthesia Procedure Notes (Signed)
Epidural Patient location during procedure: OB Start time: 11/23/2016 7:05 AM End time: 11/23/2016 7:13 AM  Staffing Anesthesiologist: Lyn Hollingshead Performed: anesthesiologist   Preanesthetic Checklist Completed: patient identified, surgical consent, pre-op evaluation, timeout performed, IV checked, risks and benefits discussed and monitors and equipment checked  Epidural Patient position: sitting Prep: site prepped and draped and DuraPrep Patient monitoring: heart rate, cardiac monitor and continuous pulse ox Approach: midline Location: thoracic (1-12) Injection technique: LOR air  Needle:  Needle type: Tuohy  Needle gauge: 17 G Needle length: 9 cm and 9 Needle insertion depth: 7 cm Catheter type: closed end flexible Catheter size: 19 Gauge Catheter at skin depth: 10 cm Test dose: negative and 2% lidocaine with Epi 1:200 K  Assessment Events: blood not aspirated, injection not painful, no injection resistance, negative IV test and no paresthesia  Additional Notes Placed @T8Reason  for block:at surgeon's request and post-op pain management

## 2016-11-23 NOTE — Anesthesia Procedure Notes (Signed)
Arterial Line Insertion Start/End7/26/2018 7:28 AM, 11/23/2016 7:32 AM Performed by: Brigid Re T  Patient location: Pre-op. Preanesthetic checklist: patient identified, IV checked, site marked, risks and benefits discussed, surgical consent, monitors and equipment checked, pre-op evaluation, timeout performed and anesthesia consent Lidocaine 1% used for infiltration radial was placed Catheter size: 20 G Hand hygiene performed  and maximum sterile barriers used   Attempts: 1 Procedure performed without using ultrasound guided technique. Following insertion, Biopatch and dressing applied. Post procedure assessment: normal  Patient tolerated the procedure well with no immediate complications. Additional procedure comments: Performed by Imagene Riches, SRNA.

## 2016-11-23 NOTE — Interval H&P Note (Signed)
History and Physical Interval Note:  11/23/2016 7:44 AM  Krystal Reynolds  has presented today for surgery, with the diagnosis of CYSTIC MASS OF THE PANCREAS  The various methods of treatment have been discussed with the patient and family. After consideration of risks, benefits and other options for treatment, the patient has consented to  Procedure(s) with comments: WHIPPLE PROCEDURE (N/A) - Epidural as a surgical intervention .  The patient's history has been reviewed, patient examined, no change in status, stable for surgery.  I have reviewed the patient's chart and labs.  Questions were answered to the patient's satisfaction.     Pollyann Roa

## 2016-11-23 NOTE — Progress Notes (Signed)
eLink Physician-Brief Progress Note Patient Name: Krystal Reynolds DOB: Feb 25, 1971 MRN: [id number]   Date of Service  11/23/2016  HPI/Events of Note  Patient admitted postoperatively. Known history of high blood pressure & cystic mass and pancreatic head. Status post pancreaticoduodenectomy. The camera shows patient extubated and resting comfortably. Eyes closed. Mildly increased work of breathing.   eICU Interventions  Continuing current plan of care as per primary service.      Intervention Category Evaluation Type: New Patient Evaluation  Tera Partridge 11/23/2016, 4:13 PM

## 2016-11-23 NOTE — Op Note (Signed)
PREOPERATIVE DIAGNOSIS: enlarging cystic pancreatic head mass  POSTOPERATIVE DIAGNOSIS: Same.   PROCEDURES PERFORMED:    Classic pancreaticoduodenectomy   Placement of biliary stent (segment of 8 Fr pediatric feeding tube) Placement of pancreatic duct stent (segment of 5 Fr pediatric feeding tube 18 Fr Jejunostomy feeding tube  SURGEON: Stark Klein, MD   ASSISTANT: Leighton Ruff, MD   ANESTHESIA: General and epidural   FINDINGS: 5 cm pancreatic head mass. soft pancreatic tissue. 6 mm common bile duct. 2 mm pancreatic duct  SPECIMENS:  1. Pancreaticoduodenectomy with gallbladder:  2. Portal nodes   ESTIMATED BLOOD LOSS: 400 mL.   COMPLICATIONS: None known.   PROCEDURE:   Pt was identified in the holding area and taken to  the operating room, and placed supine on the operating room  table. General anesthesia was induced. The patient's abdomen was  prepped and draped in a sterile fashion, after a Foley catheter was  placed. A time-out was performed according to the surgical safety check  list. When all was correct we continued.    A midline incision was made from the xiphoid to just below the umbilicus. The subcutaneous tissues were divided with the Bovie cautery. The peritoneum was entered in the center of the abdomen. Digital retraction was then used to elevate the preperitoneal fat, and  this was taken with the cautery as well. Care was taken to protect the underlying viscera.   The Bookwalter self-retaining retractor was placed  for visualization.   The pancreatic mass was mobile and soft.  The porta was identified. The  duodenum was kocherized extensively with blunt dissection and with cautery. The gallbladder was taken off the liver with a combination of blunt dissection and cautery. The cystic duct was clipped with the Hemalock clips. The cystic duct was divided and the gallbladder was passed off. The lesser sac was opened.  The Harmonic and the cautery were used to  take the omentum off the colon.    The common bile duct was skeletonized near the duodenum. A vessel loop was passed around it. The gastroduodenal artery, as well as the common hepatic artery were skeletonized. The proper hepatic artery was traced out to make sure that flow was going to both sides of the liver when the GDA was clamped. The GDA was test clamped with the bulldog and there was still good flow to the liver and  no signs of ischemia. This was divided with 2-0 silk ties and then clipped. The proper hepatic artery was reflected upward, and the anterior portal vein was exposed.  A Kelly clamp was passed underneath the pancreas at the superior mesenteric vein, and this passed  easily with no signs of tumor involvement.   Attention was then directed to the stomach, and the omentum was taken  off of the stomach at the border of the antrum and the body. The  gastrohepatic ligament was taken down with the harmonic, and care was  taken to make sure there was not a replaced left hepatic artery in this  location. The stomach was divided with the GIA-75 stapler. The border  of the stomach was oversewn with a 3-0 running PDS suture.   Attention was then directed to the small bowel. Around 10 cm past the  ligament of Treitz, the bowel was divided with the 75-GIA.  The distal portion of the jejunum was also oversewn with a 3-0 PDS  suture. The fourth portion of the duodenum was skeletonized with the  harmonic scalpel, taking down  all of the mesenteric vessels. The  ligament of Treitz was taken down. The IMV was preserved.  The duodenum was then passed underneath the portal vein.   At this point the Claiborne Billings was replaced and the pancreas was divided with the TA 30 mm stapler. 2-0 silk sutures were tied down and the inferior and superior border of the pancreas. The Bovie was used to coagulate the small bleeders at the border of the pancreas.  The Overholt in combination with the harmonic and locking  Weck clips  were then used to take the mass and the uncinate process off of the portal vein and  the superior mesenteric artery. Care was taken not to incorporate the  superior mesenteric artery in the dissection. The specimen was then marked and passed off the table for frozen section margin.   The jejunum was then passed underneath  the SMV in order to get appropriate lie for the pancreatic and biliary  anastomoses. The more distal portion of the jejunum was pulled up over  the colon, and two 3-0 silks were placed through the posterior border  of the stomach for the gastrojejunostomy. The stomach and the small  bowel were opened, and a GIA-75 was used to create an end-to-end  anastomosis. The open areas of the staple line were examined to ensure  that there was hemostasis. The defect was then closed with a single  layer of running Connell suture of 3-0 PDS. Prior to a complete  closure, the NG tube was passed toward the afferent limb.   The appropriate location for the choledochojejunostomy was identified, and  the small bowel was opened approximately 7 mm. The anastamosis was created with approximately eight 4-0 interrupted PDS sutures.   The 2 corner sutures were placed first  and then the posterior layer was done in an interrupted fashion tying on  the inside. An 8 Fr pediatric feeding tube was placed as a biliary stent.  The superior layer was then closed with interrupted sutures as  well.   At this point the frozen returned as negative for dysplasia or malignancy at the pancreatic duct.  The pancreatic anastomosis was then created. The pancreas was soft, and the duct was 2 mm. A pediatric feeding tube was used as a pancreatic stent. The posterior layer was formed first with 2-0 silk sutures in interrupted fashion. The jejunum was opened 3 mm. A duct to mucosa anastamosis was created with five 5-0 PDS interrupted sutures. The anterior layer was then oversewn with 2-0 silks to dunk the  pancreatic parenchyma.   The areas were then irrigated and then those anastomoses were covered  with Evicel. This was allowed to dry. The omentum was placed around both anastamoses. The abdomen was then irrigated  again and all the laparotomy sponges were removed. A lap count was  performed, which was correct. Two 19-Blake drains were placed, with the  lateral-most drain placed behind the choledochojejunostomy. The medial  Blake drain was placed just anterior and slightly superior to the  pancreaticojejunostomy.   Approximately 20-30 cm beyond the gastrojejunostomy was identified. A 3-0 silk pursestring suture was placed in the small intestine on the antimesenteric border. An appropriate location for a J-tube was located in the left upper abdomen. A incision was made in the tonsil used to pull the J-tube through the abdominal wall. The jejunum was opened and the tube was threaded distally in the jejunum.    Witzel sutures were placed proximally on the jejunum.  The tube  was then pexed to the abdominal wall with 2-0 silk sutures.  The tube was flushed with saline.   The fascia was then closed with #1 looped running PDS sutures. The skin was irrigated and then closed with  staples. The wounds were cleaned, dried and dressed with a sterile  dressing.   The patient tolerated the procedure well and was extubated and taken to  PACU in stable condition. Needle and sponge counts were correct x2.

## 2016-11-23 NOTE — Anesthesia Postprocedure Evaluation (Signed)
Anesthesia Post Note  Patient: Krystal Reynolds  Procedure(s) Performed: Procedure(s) (LRB): WHIPPLE PROCEDURE (N/A)     Patient location during evaluation: PACU Anesthesia Type: Epidural and General Level of consciousness: awake and sedated Pain management: pain level controlled Vital Signs Assessment: post-procedure vital signs reviewed and stable Respiratory status: spontaneous breathing Cardiovascular status: stable Postop Assessment: no headache and no backache Anesthetic complications: no Comments: At 2:24  pt was bolused with 10 cc of 0.125% Marcaine via her epidural. Her pain score was zero at the time. She was then started on an epidural infusion of 0.125% Marcaine at 10cc/hr.    Last Vitals:  Vitals:   11/23/16 1415 11/23/16 1426  BP: 130/74   Pulse: 83   Resp:  12  Temp:      Last Pain:  Vitals:   11/23/16 1426  TempSrc:   PainSc: Asleep   Pain Goal:                 Daleisa Halperin JR,JOHN Thalya Fouche

## 2016-11-23 NOTE — Transfer of Care (Cosign Needed)
Immediate Anesthesia Transfer of Care Note  Patient: Krystal Reynolds  Procedure(s) Performed: Procedure(s): WHIPPLE PROCEDURE (N/A)  Patient Location: PACU  Anesthesia Type:GA combined with regional for post-op pain  Level of Consciousness: drowsy, patient cooperative and responds to stimulation  Airway & Oxygen Therapy: Patient Spontanous Breathing and Patient connected to nasal cannula oxygen  Post-op Assessment: Report given to RN and Post -op Vital signs reviewed and stable  Post vital signs: Reviewed and stable  Last Vitals:  Vitals:   11/23/16 0557  BP: (!) 169/90  Pulse: (!) 56  Resp: 20  Temp: 36.7 C   HR 81 BP 111/52 RR 17 SPO2 96 Last Pain:  Vitals:   11/23/16 0557  TempSrc: Oral         Complications: No apparent anesthesia complications

## 2016-11-24 ENCOUNTER — Encounter (HOSPITAL_COMMUNITY): Payer: Self-pay | Admitting: General Surgery

## 2016-11-24 LAB — CBC
HEMATOCRIT: 37.1 % (ref 36.0–46.0)
Hemoglobin: 12.5 g/dL (ref 12.0–15.0)
MCH: 29.3 pg (ref 26.0–34.0)
MCHC: 33.7 g/dL (ref 30.0–36.0)
MCV: 86.9 fL (ref 78.0–100.0)
PLATELETS: 306 10*3/uL (ref 150–400)
RBC: 4.27 MIL/uL (ref 3.87–5.11)
RDW: 13.1 % (ref 11.5–15.5)
WBC: 25.9 10*3/uL — AB (ref 4.0–10.5)

## 2016-11-24 LAB — TYPE AND SCREEN
ABO/RH(D): O POS
ANTIBODY SCREEN: NEGATIVE
UNIT DIVISION: 0
Unit division: 0
Unit division: 0
Unit division: 0

## 2016-11-24 LAB — COMPREHENSIVE METABOLIC PANEL
ALBUMIN: 3.1 g/dL — AB (ref 3.5–5.0)
ALT: 120 U/L — ABNORMAL HIGH (ref 14–54)
AST: 114 U/L — AB (ref 15–41)
Alkaline Phosphatase: 84 U/L (ref 38–126)
Anion gap: 3 — ABNORMAL LOW (ref 5–15)
BILIRUBIN TOTAL: 1 mg/dL (ref 0.3–1.2)
BUN: 11 mg/dL (ref 6–20)
CO2: 25 mmol/L (ref 22–32)
CREATININE: 0.77 mg/dL (ref 0.44–1.00)
Calcium: 7.5 mg/dL — ABNORMAL LOW (ref 8.9–10.3)
Chloride: 105 mmol/L (ref 101–111)
GFR calc Af Amer: >60 mL/min (ref >60)
Glucose, Bld: 159 mg/dL — ABNORMAL HIGH (ref 65–99)
POTASSIUM: 4.8 mmol/L (ref 3.5–5.1)
Sodium: 133 mmol/L — ABNORMAL LOW (ref 135–145)
TOTAL PROTEIN: 5.5 g/dL — AB (ref 6.5–8.1)

## 2016-11-24 LAB — BPAM RBC
BLOOD PRODUCT EXPIRATION DATE: 201808232359
Blood Product Expiration Date: 201808212359
Blood Product Expiration Date: 201808222359
Blood Product Expiration Date: 201808222359
ISSUE DATE / TIME: 201807232356
ISSUE DATE / TIME: 201807260935
ISSUE DATE / TIME: 201807260935
UNIT TYPE AND RH: 5100
UNIT TYPE AND RH: 5100
UNIT TYPE AND RH: 5100
Unit Type and Rh: 5100

## 2016-11-24 LAB — PHOSPHORUS: Phosphorus: 2.6 mg/dL (ref 2.5–4.6)

## 2016-11-24 LAB — PROTIME-INR
INR: 1.09
Prothrombin Time: 14.1 seconds (ref 11.4–15.2)

## 2016-11-24 LAB — MAGNESIUM: MAGNESIUM: 1.8 mg/dL (ref 1.7–2.4)

## 2016-11-24 NOTE — Progress Notes (Signed)
1 Day Post-Op   Subjective/Chief Complaint: Had some pain and nausea yesterday, doing better today.     Objective: Vital signs in last 24 hours: Temp:  [97.6 F (36.4 C)-99.1 F (37.3 C)] 97.6 F (36.4 C) (07/27 1154) Pulse Rate:  [69-90] 90 (07/27 1100) Resp:  [9-20] 11 (07/27 1100) BP: (93-162)/(55-83) 138/75 (07/27 1100) SpO2:  [93 %-99 %] 97 % (07/27 1100) Arterial Line BP: (88-181)/(51-103) 106/86 (07/27 1100) Weight:  [100.7 kg (222 lb 0.1 oz)] 100.7 kg (222 lb 0.1 oz) (07/26 1600) Last BM Date: 11/23/16 (prior to surgery)  Intake/Output from previous day: 07/26 0701 - 07/27 0700 In: 6365.8 [I.V.:5795.8; NG/GT:20; IV Piggyback:550] Out: 1610 [Urine:1705; Drains:245; Blood:500] Intake/Output this shift: Total I/O In: 500 [I.V.:500] Out: 370 [Urine:230; Emesis/NG output:10; Drains:130]  General appearance: alert, cooperative and mild distress Resp: breathing comfortably Cardio: regular rate and rhythm GI: soft, non distended, J tube in place.  drains serosang.  dressing c/d/i.   Extremities: extremities normal, atraumatic, no cyanosis or edema  Lab Results:   Recent Labs  11/23/16 1547 11/24/16 0531  WBC 23.3* 25.9*  HGB 12.5 12.5  HCT 36.9 37.1  PLT 282 306   BMET  Recent Labs  11/23/16 1547 11/24/16 0531  NA  --  133*  K  --  4.8  CL  --  105  CO2  --  25  GLUCOSE  --  159*  BUN  --  11  CREATININE 1.06* 0.77  CALCIUM  --  7.5*   PT/INR  Recent Labs  11/24/16 0531  LABPROT 14.1  INR 1.09   ABG No results for input(s): PHART, HCO3 in the last 72 hours.  Invalid input(s): PCO2, PO2  Studies/Results: No results found.  Anti-infectives: Anti-infectives    Start     Dose/Rate Route Frequency Ordered Stop   11/23/16 2000  ceFAZolin (ANCEF) IVPB 2g/100 mL premix     2 g 200 mL/hr over 30 Minutes Intravenous Every 8 hours 11/23/16 1520 11/23/16 2228   11/23/16 0600  ceFAZolin (ANCEF) IVPB 2g/100 mL premix     2 g 200 mL/hr over 30  Minutes Intravenous To [facility] 11/22/16 0814 11/23/16 1205      Assessment/Plan: s/p Procedure(s): WHIPPLE PROCEDURE (N/A) epidural/pca for pain control  NPO/NGT Hyperglycemia - start SSI. Ambulate Hold lovenox until post epidural.  LOS: 1 day    [facility] 11/24/2016

## 2016-11-24 NOTE — Care Management Note (Signed)
Case Management Note  Patient Details  Name: Krystal Reynolds MRN: [id number] Date of Birth: 1970-07-19  Subjective/Objective:  Pt admitted on 11/23/16 s/p Whipple procedure.  PTA, pt independent, lives at home with spouse.                   Action/Plan: Will follow for discharge planning needs as pt progresses.    Expected Discharge Date:                  Expected Discharge Plan:  Home/Self Care  In-House Referral:     Discharge planning Services  CM Consult  Post Acute Care Choice:    Choice offered to:     DME Arranged:    DME Agency:     HH Arranged:    HH Agency:     Status of Service:  In process, will continue to follow  If discussed at Long Length of Stay Meetings, dates discussed:    Additional Comments:  Reinaldo Raddle, RN, BSN  Trauma/Neuro ICU Case Manager [phone]

## 2016-11-24 NOTE — Progress Notes (Signed)
Anesthesiology Follow-up Epidural Infusion:  Patient awake and alert, sitting in chair. Pain well controlled with epidural Bupivacaine 0.125% and dilaudid PCA. Mild LE numbness. Able to bear weight with assistance.   VS: T-36.4 BP- 106/52 HR- 89 RR- 12 O2 Sat 99% on 2L nasal cannula.  Good pain control with present management. Will continue epidural bupivacaine 0.125%  Roberts Gaudy

## 2016-11-24 NOTE — Addendum Note (Signed)
Addendum  created 11/24/16 1711 by Roberts Gaudy, MD   Sign clinical note

## 2016-11-25 ENCOUNTER — Encounter (HOSPITAL_COMMUNITY): Payer: Self-pay | Admitting: Anesthesiology

## 2016-11-25 LAB — CBC
HEMATOCRIT: 35.9 % — AB (ref 36.0–46.0)
HEMOGLOBIN: 11.9 g/dL — AB (ref 12.0–15.0)
MCH: 29.8 pg (ref 26.0–34.0)
MCHC: 33.1 g/dL (ref 30.0–36.0)
MCV: 90 fL (ref 78.0–100.0)
Platelets: 313 10*3/uL (ref 150–400)
RBC: 3.99 MIL/uL (ref 3.87–5.11)
RDW: 13.6 % (ref 11.5–15.5)
WBC: 35.8 10*3/uL — AB (ref 4.0–10.5)

## 2016-11-25 LAB — GLUCOSE, CAPILLARY: Glucose-Capillary: 119 mg/dL — ABNORMAL HIGH (ref 65–99)

## 2016-11-25 MED ORDER — PIVOT 1.5 CAL PO LIQD
1000.0000 mL | ORAL | Status: DC
  Administered 2016-11-25: 1000 mL
  Filled 2016-11-25: qty 1000

## 2016-11-25 MED ORDER — PHENOL 1.4 % MT LIQD: 1.0000 | OROMUCOSAL | Status: DC | PRN

## 2016-11-25 NOTE — Progress Notes (Signed)
[facility] Progress Note 2 Days Post-Op  Subjective: Patient doing okay.  Tryin as much as she can.  No acute distress.  Sats are low 90's  Objective: Vital signs in last 24 hours: Temp:  [97.6 F (36.4 C)-98.9 F (37.2 C)] 98.6 F (37 C) (07/28 1134) Pulse Rate:  [85-114] 109 (07/28 1000) Resp:  [6-24] 20 (07/28 1121) BP: (103-136)/(48-75) 133/62 (07/28 1000) SpO2:  [91 %-99 %] 92 % (07/28 1121) Last BM Date: 11/23/16  Intake/Output from previous day: 07/27 0701 - 07/28 0700 In: 3020 [I.V.:3000; NG/GT:20] Out: 1500 [Urine:1080; Emesis/NG output:25; Drains:395] Intake/Output this shift: Total I/O In: 375 [I.V.:375] Out: 75 [Urine:75]  General: No distress  Lungs: Clear.  IS only up to 500  Abd: Soft, no bowel sounds.  Pancreatic and biliary drains total 365cc.  Both look bilious.  Extremities: No clinical signs or symptoms of DVT  Neuro: Intact  Lab Results:  @LABLAST2 (wbc:2,hgb:2,hct:2,plt:2) BMET ) Recent Labs  11/23/16 1547 11/24/16 0531  NA  --  133*  K  --  4.8  CL  --  105  CO2  --  25  GLUCOSE  --  159*  BUN  --  11  CREATININE 1.06* 0.77  CALCIUM  --  7.5*   PT/INR  Recent Labs  11/24/16 0531  LABPROT 14.1  INR 1.09   ABG No results for input(s): PHART, HCO3 in the last 72 hours.  Invalid input(s): PCO2, PO2  Studies/Results: No results found.  Anti-infectives: Anti-infectives    Start     Dose/Rate Route Frequency Ordered Stop   11/23/16 2000  ceFAZolin (ANCEF) IVPB 2g/100 mL premix     2 g 200 mL/hr over 30 Minutes Intravenous Every 8 hours 11/23/16 1520 11/23/16 2228   11/23/16 0600  ceFAZolin (ANCEF) IVPB 2g/100 mL premix     2 g 200 mL/hr over 30 Minutes Intravenous To ShortStay Surgical 11/22/16 0814 11/23/16 1205      Assessment/Plan: s/p Procedure(s): WHIPPLE PROCEDURE Start tube feedings at low rate  WBC up to 35K today, but nothing stands out clearly as a source of problems Possible transfer to SDU if bed  available  LOS: 2 days   Kathryne Eriksson. Dahlia Bailiff, MD, FACS [phone] [facility] Surgery 11/25/2016

## 2016-11-25 NOTE — Progress Notes (Signed)
Transferred pt to 1m04 at this time. Pt has no s/s of any acute distress.  PCA dilaudid keeping pain tolerable.  Family aware of transfer.

## 2016-11-25 NOTE — Progress Notes (Signed)
Patient oral temp 101.5. Hulen Skains, MD notified. Verbal orders for tylenol per tube if temp >102 and encourage Incentive spirometer.

## 2016-11-25 NOTE — Plan of Care (Signed)
Problem: Pain Management: Goal: Pain level will decrease Outcome: Progressing PCA remains in place.Patient using PCA as needed.

## 2016-11-25 NOTE — Anesthesia Post-op Follow-up Note (Signed)
  Anesthesia Pain Follow-up Note  Patient: Krystal Reynolds  Day #: 2  Date of Follow-up: 11/25/2016 Time: 8:40 PM  Last Vitals:  Vitals:   11/25/16 1947 11/25/16 1950  BP:    Pulse:    Resp: 18 (!) 22  Temp:      Level of Consciousness: alert  Pain: none   Side Effects:None  Catheter Site Exam:clean, dry, no drainage  Epidural / Intrathecal    Start     Dose/Rate Route Frequency Ordered Stop   11/23/16 1415  bupivacaine (SENSORCAINE-MPF) 0.125 % in sodium chloride 0.9 % 150 mL epidural     10 mL/hr  Epidural Continuous 11/23/16 1344 11/26/16 1414       Plan: Continue current therapy of postop epidural at surgeon's request  [place]la[place] S

## 2016-11-26 LAB — COMPREHENSIVE METABOLIC PANEL
ALT: 75 U/L — AB (ref 14–54)
ANION GAP: 5 (ref 5–15)
AST: 40 U/L (ref 15–41)
Albumin: 2.1 g/dL — ABNORMAL LOW (ref 3.5–5.0)
Alkaline Phosphatase: 70 U/L (ref 38–126)
BUN: 9 mg/dL (ref 6–20)
CHLORIDE: 103 mmol/L (ref 101–111)
CO2: 25 mmol/L (ref 22–32)
CREATININE: 0.88 mg/dL (ref 0.44–1.00)
Calcium: 7.4 mg/dL — ABNORMAL LOW (ref 8.9–10.3)
Glucose, Bld: 115 mg/dL — ABNORMAL HIGH (ref 65–99)
Potassium: 4.4 mmol/L (ref 3.5–5.1)
SODIUM: 133 mmol/L — AB (ref 135–145)
Total Bilirubin: 1 mg/dL (ref 0.3–1.2)
Total Protein: 5.1 g/dL — ABNORMAL LOW (ref 6.5–8.1)

## 2016-11-26 LAB — CBC
HEMATOCRIT: 31.5 % — AB (ref 36.0–46.0)
HEMOGLOBIN: 10.4 g/dL — AB (ref 12.0–15.0)
MCH: 30 pg (ref 26.0–34.0)
MCHC: 33 g/dL (ref 30.0–36.0)
MCV: 90.8 fL (ref 78.0–100.0)
Platelets: 240 10*3/uL (ref 150–400)
RBC: 3.47 MIL/uL — ABNORMAL LOW (ref 3.87–5.11)
RDW: 13.7 % (ref 11.5–15.5)
WBC: 33.8 10*3/uL — ABNORMAL HIGH (ref 4.0–10.5)

## 2016-11-26 MED ORDER — PIVOT 1.5 CAL PO LIQD
1000.0000 mL | ORAL | Status: DC
Start: 2016-11-26 — End: 2016-12-02
  Administered 2016-11-26 – 2016-11-30 (×3): 1000 mL
  Filled 2016-11-26 (×2): qty 1000

## 2016-11-26 MED ORDER — ACETAMINOPHEN 160 MG/5ML PO SOLN
650.0000 mg | Freq: Four times a day (QID) | ORAL | Status: DC | PRN
  Administered 2016-11-27 – 2016-12-07 (×5): 650 mg
  Filled 2016-11-26 (×5): qty 20.3

## 2016-11-26 NOTE — Progress Notes (Signed)
3 Days Post-Op   Subjective/Chief Complaint: Patient awake, alert Feels "bloated" Pain moderately controlled No BM or flatus yet Tolerating tube feeds   Objective: Vital signs in last 24 hours: Temp:  [98.6 F (37 C)-101.5 F (38.6 C)] 98.8 F (37.1 C) (07/29 0343) Pulse Rate:  [107-123] 115 (07/29 0343) Resp:  [14-24] 18 (07/29 0829) BP: (106-141)/(51-79) 106/79 (07/29 0803) SpO2:  [89 %-97 %] 91 % (07/29 0829) Weight:  [108.4 kg (238 lb 15.7 oz)] 108.4 kg (238 lb 15.7 oz) (07/29 0343) Last BM Date: 11/23/16  Intake/Output from previous day: 07/28 0701 - 07/29 0700 In: 3055 [I.V.:2875; NG/GT:180] Out: 2170 [Urine:1850; Drains:320] Intake/Output this shift: Total I/O In: -  Out: 50 [Drains:50]  PE: General: No distress Lungs: CTA B Abd: Soft, hypactive bowel sounds.  Pancreatic and biliary drains total 320cc.  Both look bilious. Minimal NG output Incision c/d/i Extremities: No clinical signs or symptoms of DVT  Neuro: Intact Lab Results:   Recent Labs  11/25/16 0317 11/26/16 0242  WBC 35.8* 33.8*  HGB 11.9* 10.4*  HCT 35.9* 31.5*  PLT 313 240   BMET  Recent Labs  11/24/16 0531 11/26/16 0242  NA 133* 133*  K 4.8 4.4  CL 105 103  CO2 25 25  GLUCOSE 159* 115*  BUN 11 9  CREATININE 0.77 0.88  CALCIUM 7.5* 7.4*   PT/INR  Recent Labs  11/24/16 0531  LABPROT 14.1  INR 1.09   ABG No results for input(s): PHART, HCO3 in the last 72 hours.  Invalid input(s): PCO2, PO2  Studies/Results: No results found.  Anti-infectives: Anti-infectives    Start     Dose/Rate Route Frequency Ordered Stop   11/23/16 2000  ceFAZolin (ANCEF) IVPB 2g/100 mL premix     2 g 200 mL/hr over 30 Minutes Intravenous Every 8 hours 11/23/16 1520 11/23/16 2228   11/23/16 0600  ceFAZolin (ANCEF) IVPB 2g/100 mL premix     2 g 200 mL/hr over 30 Minutes Intravenous To ShortStay Surgical 11/22/16 0814 11/23/16 1205      Assessment/Plan: s/p Procedure(s): WHIPPLE  PROCEDURE (N/A) Clamp NG tube Sips of clear liquids WBC slightly decreased to 33K Drains bilious Increase tube feeds to 20 ml/hr   LOS: 3 days    Krystal Reinhardt K. 11/26/2016

## 2016-11-26 NOTE — Plan of Care (Signed)
Problem: Activity: Goal: Ability to tolerate increased activity will improve Outcome: Progressing Patient able to ambulate to chair with assistance.

## 2016-11-26 NOTE — Anesthesia Post-op Follow-up Note (Signed)
  Anesthesia Pain Follow-up Note  Patient: Krystal Reynolds  Day #: 3  Date of Follow-up: 11/26/2016 Time: 12:11 PM  Last Vitals:  Vitals:   11/26/16 0829 11/26/16 1200  BP:    Pulse:    Resp: 18 20  Temp:      Level of Consciousness: Drowsy  Pain: mild. Describes soreness with pain score of 1 at rest and 4 with movement. T5 level to cold.  Side Effects:None  Catheter Site Exam:clean, dry, no drainage  Epidural / Intrathecal    Start     Dose/Rate Route Frequency Ordered Stop   11/23/16 1415  bupivacaine (SENSORCAINE-MPF) 0.125 % in sodium chloride 0.9 % 150 mL epidural     0-20 mL/hr  Epidural Continuous 11/23/16 1344 11/26/16 1414       Plan: Continue current therapy of postop epidural at surgeon's request  Aristidis Talerico,W. EDMOND

## 2016-11-27 LAB — CBC
HCT: 29.3 % — ABNORMAL LOW (ref 36.0–46.0)
Hemoglobin: 9.6 g/dL — ABNORMAL LOW (ref 12.0–15.0)
MCH: 29.4 pg (ref 26.0–34.0)
MCHC: 32.8 g/dL (ref 30.0–36.0)
MCV: 89.6 fL (ref 78.0–100.0)
PLATELETS: 272 10*3/uL (ref 150–400)
RBC: 3.27 MIL/uL — AB (ref 3.87–5.11)
RDW: 13.6 % (ref 11.5–15.5)
WBC: 29.5 10*3/uL — AB (ref 4.0–10.5)

## 2016-11-27 MED ORDER — FLUCONAZOLE IN SODIUM CHLORIDE 400-0.9 MG/200ML-% IV SOLN
400.0000 mg | INTRAVENOUS | Status: DC
  Administered 2016-11-28 – 2016-12-06 (×9): 400 mg via INTRAVENOUS
  Filled 2016-11-27 (×10): qty 200

## 2016-11-27 MED ORDER — FLUCONAZOLE IN SODIUM CHLORIDE 400-0.9 MG/200ML-% IV SOLN
800.0000 mg | Freq: Once | INTRAVENOUS | Status: AC
  Administered 2016-11-27: 800 mg via INTRAVENOUS
  Filled 2016-11-27: qty 400

## 2016-11-27 MED ORDER — PIPERACILLIN-TAZOBACTAM 3.375 G IVPB
3.3750 g | Freq: Three times a day (TID) | INTRAVENOUS | Status: DC
  Administered 2016-11-27 – 2016-12-07 (×28): 3.375 g via INTRAVENOUS
  Filled 2016-11-27 (×33): qty 50

## 2016-11-27 NOTE — Care Management Note (Addendum)
Case Management Note  Patient Details  Name: DAMYA COMLEY MRN: [id number] Date of Birth: 02/19/1971  Subjective/Objective: from home with spouse, pta indep, POD4 Whipple Procedure, plan to dc ngtube, start sips/clears, dc epidural , conts to be fribrile, on ivf's pca, iv abx.  She will have her mother, her spouse, and her children with her at discharge.     7/31 [place], BSN- POD4 whipple procedure, sips/clears, conts to be febrile , conts on ivf's pca, iv abx. Discussed in LOS 7/31. Appropriate for continued stay.   8/1 [place], BSN - POD 5, conts on clears, pca, iv abx,has two drains.  Will transfer to floor today.                 Action/Plan: NCM will follow for dc needs.   Expected Discharge Date:                  Expected Discharge Plan:  Home/Self Care  In-House Referral:     Discharge planning Services  CM Consult  Post Acute Care Choice:    Choice offered to:     DME Arranged:    DME Agency:     HH Arranged:    HH Agency:     Status of Service:  In process, will continue to follow  If discussed at Long Length of Stay Meetings, dates discussed:    Additional Comments:  Zenon Mayo, RN 11/27/2016, 5:04 PM

## 2016-11-27 NOTE — Anesthesia Post-op Follow-up Note (Signed)
  Anesthesia Pain Follow-up Note  Patient: Krystal Reynolds  Day #: 4  Date of Follow-up: 11/27/2016 Time: 11:28 AM  Last Vitals:  Vitals:   11/27/16 0814 11/27/16 1000  BP:  124/75  Pulse:    Resp: (!) 26 (!) 27  Temp:  37.7 C    Level of Consciousness: alert  Pain: none   Side Effects:Nausea and Vomiting  Catheter Site Exam:clean, dry, no drainage  Epidural / Intrathecal    Start     Dose/Rate Route Frequency Ordered Stop   11/23/16 1415  bupivacaine (SENSORCAINE-MPF) 0.125 % in sodium chloride 0.9 % 150 mL epidural     0-20 mL/hr  Epidural Continuous 11/23/16 1344 11/29/16 1414       Plan: Continue current therapy of postop epidural at surgeon's request. No pain in her belly. Fever today. Surgeon starting antibiotics. Burping but no flatus or BM.   Tiajuana Amass

## 2016-11-27 NOTE — Progress Notes (Signed)
4 Days Post-Op   Subjective/Chief Complaint: No flatus, but no n/v either.  Had fever this AM.     Objective: Vital signs in last 24 hours: Temp:  [97.7 F (36.5 C)-101.7 F (38.7 C)] 97.9 F (36.6 C) (07/30 1135) Pulse Rate:  [117-127] 127 (07/30 0432) Resp:  [20-30] 24 (07/30 1200) BP: (124-153)/(70-83) 132/73 (07/30 1200) SpO2:  [89 %-97 %] 97 % (07/30 1135) Last BM Date: 11/23/16  Intake/Output from previous day: 07/29 0701 - 07/30 0700 In: 3504.3 [P.O.:120; I.V.:3000; NG/GT:384.3] Out: 1160 [Urine:1075; Drains:85] Intake/Output this shift: Total I/O In: 1040 [P.O.:240; I.V.:750; IV Piggyback:50] Out: 74 [Urine:850; Drains:10]  PE: General: No distress Lungs:breathing comfortably Abd: soft, non distended.  Drains slightly bilious.  Output down.   Dressing c/d/i Extremities: No clinical signs or symptoms of DVT  Neuro: Intact Lab Results:   Recent Labs  11/26/16 0242 11/27/16 0210  WBC 33.8* 29.5*  HGB 10.4* 9.6*  HCT 31.5* 29.3*  PLT 240 272   BMET  Recent Labs  11/26/16 0242  NA 133*  K 4.4  CL 103  CO2 25  GLUCOSE 115*  BUN 9  CREATININE 0.88  CALCIUM 7.4*   PT/INR No results for input(s): LABPROT, INR in the last 72 hours. ABG No results for input(s): PHART, HCO3 in the last 72 hours.  Invalid input(s): PCO2, PO2  Studies/Results: No results found.  Anti-infectives: Anti-infectives    Start     Dose/Rate Route Frequency Ordered Stop   11/28/16 1200  fluconazole (DIFLUCAN) IVPB 400 mg     400 mg 100 mL/hr over 120 Minutes Intravenous Every 24 hours 11/27/16 1115     11/27/16 1200  piperacillin-tazobactam (ZOSYN) IVPB 3.375 g     3.375 g 12.5 mL/hr over 240 Minutes Intravenous Every 8 hours 11/27/16 1109     11/27/16 1200  fluconazole (DIFLUCAN) IVPB 800 mg     800 mg 100 mL/hr over 240 Minutes Intravenous  Once 11/27/16 1114     11/23/16 2000  ceFAZolin (ANCEF) IVPB 2g/100 mL premix     2 g 200 mL/hr over 30 Minutes  Intravenous Every 8 hours 11/23/16 1520 11/23/16 2228   11/23/16 0600  ceFAZolin (ANCEF) IVPB 2g/100 mL premix     2 g 200 mL/hr over 30 Minutes Intravenous To ShortStay Surgical 11/22/16 0814 11/23/16 1205      Assessment/Plan: s/p Procedure(s): WHIPPLE PROCEDURE (N/A) D/c ngt. Sips of clears D/c epidural Started antibiotics Path negative.  To floor tomorrow if fever curve down.   LOS: 4 days    Jo-Ann Johanning 11/27/2016

## 2016-11-27 NOTE — Progress Notes (Signed)
Pharmacy Antibiotic Note  Krystal Reynolds is a 46 y.o. 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 Patient is s/p whipple procedure on 7/26 and there are now concerns for an intra-abdominal infection.  Pharmacy has been consulted for Zosyn/Fluconazole dosing. WBC count has been elevated but is down today to 29.5 from 33.8 yesterday. Tmax in 24 hours is 101.7. Scr < 1 with CrCl > 100 ml/min. LFTS within normal limits.   Plan: Zosyn 3.375 gm every 8 hours ( extended infusion over 4 hours) Fluconazole  800 mg x 1 then 400 mg every 24 hours   Height: 5\' 7"  (170.2 cm) Weight: 238 lb 15.7 oz (108.4 kg) IBW/kg (Calculated) : 61.6  Temp (24hrs), Avg:99.6 F (37.6 C), Min:97.7 F (36.5 C), Max:101.7 F (38.7 C)   Recent Labs Lab 11/23/16 1547 11/24/16 0531 11/25/16 0317 11/26/16 0242 11/27/16 0210  WBC 23.3* 25.9* 35.8* 33.8* 29.5*  CREATININE 1.06* 0.77  --  0.88  --     Estimated Creatinine Clearance: 102.3 mL/min (by C-G formula based on SCr of 0.88 mg/dL).    No Known Allergies  Antimicrobials this admission: 7/30 Zosyn>> 7/30 Fluconazole >>    Thank you for allowing pharmacy to be a part of this patient's care.  Susa Raring , PharmD PGY2 Infectious Diseases Pharmacy Resident Pager: [phone]  11/27/2016 11:05 AM

## 2016-11-27 NOTE — Progress Notes (Signed)
Pt w/ no N/V past 2 days. NG tube w/ 0 output. Messaged covering gen. surgeon regarding removal.

## 2016-11-28 ENCOUNTER — Inpatient Hospital Stay (HOSPITAL_COMMUNITY): Payer: BC Managed Care – PPO

## 2016-11-28 LAB — CBC
HEMATOCRIT: 25.6 % — AB (ref 36.0–46.0)
Hemoglobin: 8.5 g/dL — ABNORMAL LOW (ref 12.0–15.0)
MCH: 29.7 pg (ref 26.0–34.0)
MCHC: 33.2 g/dL (ref 30.0–36.0)
MCV: 89.5 fL (ref 78.0–100.0)
Platelets: 238 10*3/uL (ref 150–400)
RBC: 2.86 MIL/uL — ABNORMAL LOW (ref 3.87–5.11)
RDW: 13.9 % (ref 11.5–15.5)
WBC: 28.8 10*3/uL — ABNORMAL HIGH (ref 4.0–10.5)

## 2016-11-28 MED ORDER — ENOXAPARIN SODIUM 40 MG/0.4ML ~~LOC~~ SOLN
40.0000 mg | SUBCUTANEOUS | Status: DC
  Administered 2016-11-28 – 2016-12-08 (×11): 40 mg via SUBCUTANEOUS
  Filled 2016-11-28 (×10): qty 0.4

## 2016-11-28 NOTE — Anesthesia Pain Management Evaluation Note (Signed)
  Anesthesia Pain Consult Note  Patient: Krystal Reynolds, 46 y.o., female  Consult Requested by: Stark Klein, MD  Reason for Consult: Post op pain management s/p Whipple POD #5  Level of Consciousness: alert  Pain: No surgical pain, complaint of shoulder soreness only   Last Vitals:  Vitals:   11/28/16 1600 11/28/16 1613  BP:  (!) 180/96  Pulse: (!) 110   Resp: (!) 28   Temp:  37.1 C    Plan:  - Exam normal, neuro intact  - Epidural removed, tip intact - Care provided and discontinued on 7/31 per surgeons request.   No Known Allergies  I have reviewed the patient's medications listed below. . enoxaparin (LOVENOX) injection  40 mg Subcutaneous Q24H  . HYDROmorphone   Intravenous Q4H  . mouth rinse  15 mL Mouth Rinse BID  . pantoprazole (PROTONIX) IV  40 mg Intravenous QHS   . bupivacaine (MARCAINE, SENSORCAINE) epidural Stopped (11/28/16 1217)  . dextrose 5 % and 0.45 % NaCl with KCl 20 mEq/L 75 mL/hr at 11/28/16 1224  . feeding supplement (PIVOT 1.5 CAL) 1,000 mL (11/27/16 0436)  . fluconazole (DIFLUCAN) IV Stopped (11/28/16 1430)  . methocarbamol (ROBAXIN)  IV    . piperacillin-tazobactam (ZOSYN)  IV 3.375 g (11/28/16 1224)   acetaminophen (TYLENOL) oral liquid 160 mg/5 mL, diphenhydrAMINE **OR** diphenhydrAMINE, hydrALAZINE, HYDROmorphone (DILAUDID) injection, methocarbamol (ROBAXIN)  IV, naloxone **AND** sodium chloride flush, ondansetron (ZOFRAN) IV, ondansetron **OR** [DISCONTINUED] ondansetron (ZOFRAN) IV, phenol  Past Medical History:  Diagnosis Date  . Anemia 01-02-12   mostly low iron- tx. oral meds, not in several yrs  . 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 Chronic kidney disease 01-02-12   dx. rt. kidney neoplasm-surgery planned, past hx. protein in urine.  . Depression   . Family history of anesthesia complication    mother slow to wake up   . Leg swelling 01-02-12   occ. episodes of  pitting edema lower extremities at end of day.  . Preeclampsia    '98  . Pregnancy induced hypertension   . Sleep apnea 01-02-12   does not use cpap  lost wt  . Thyroid enlargement 01-02-12   left thyroid enlargement, being watched, no tx.   Past Surgical History:  Procedure Laterality Date  . ABDOMINAL HYSTERECTOMY  2008  . BREATH TEK H PYLORI  09/20/2011   Procedure: BREATH TEK H PYLORI;  Surgeon: Shann Medal, MD;  Location: [facility];  Service: General;  Laterality: N/A;  . childbirth  01-02-12   x2 NVD- pre-exclampsia with pregrancy  . EUS  12/07/2011   Procedure: UPPER ENDOSCOPIC ULTRASOUND (EUS) LINEAR;  Surgeon: Milus Banister, MD;  Location: [facility];  Service: Endoscopy;  Laterality: N/A;  radial linear  . right partial nephrectomy   13,15   also left partial nefhrectomy 15-20%  . ROBOTIC ASSITED PARTIAL NEPHRECTOMY Left 10/02/2013   Procedure: ROBOTIC ASSITED PARTIAL NEPHRECTOMY;  Surgeon: Raynelle Bring, MD;  Location: [facility];  Service: Urology;  Laterality: Left;  . WHIPPLE PROCEDURE N/A 11/23/2016   Procedure: WHIPPLE PROCEDURE;  Surgeon: Stark Klein, MD;  Location: [place];  Service: General;  Laterality: N/A;    reports that she has never smoked. She has never used smokeless tobacco. She reports that she drinks alcohol. She reports that she does not use drugs.    Crissie Sickles Smith Robert, MD, [facility] Anesthesiology   11/28/2016

## 2016-11-28 NOTE — Anesthesia Post-op Follow-up Note (Signed)
Anesthesia Pain Follow-up Note  Patient: Krystal Reynolds  Day #: 5  Date of Follow-up: 11/28/2016 Time: 5:02 PM  Last Vitals:  Vitals:   11/28/16 1613 11/28/16 1655  BP: (!) 180/96   Pulse:    Resp:  (!) 30  Temp: 37.1 C     Level of Consciousness: alert  Pain: no surgical pain, shoulder soreness only.    Side Effects:None  Catheter Site Exam:clean, dry, no drainage  Anti-Coag Meds    Start     Dose/Rate Route Frequency Ordered Stop   11/28/16 2030  enoxaparin (LOVENOX) injection 40 mg    Comments:  Hold for Hgb < 8 or SBP < 90 given concerns of active bleeding   40 mg Subcutaneous Every 24 hours 11/28/16 0817      Epidural / Intrathecal    Start     Dose/Rate Route Frequency Ordered Stop   11/23/16 1415  bupivacaine (SENSORCAINE-MPF) 0.125 % in sodium chloride 0.9 % 150 mL epidural     0-20 mL/hr  Epidural Continuous 11/23/16 1344 11/29/16 1414       Plan: Catheter removed/tip intact at surgeon's request, D/C Infusion at surgeon's request and D/C from anesthesia care at surgeon's request  Lennette Bihari D. Smith Robert, MD, [facility] Anesthesiology

## 2016-11-28 NOTE — Progress Notes (Signed)
Pt was on 4L this AM. SpO2 dropped to low 80s.  Switched to HFNC at 7L. Now Sp02 90%. Still tachypnic. will continue to monitor. Relayed to covering gen. Surgery MD.

## 2016-11-28 NOTE — Progress Notes (Addendum)
Messaged covering gen. Surgery MD: Epidural just removed. Will remove foley. Can we get something for constipation? pt w/ no BM since admission and hypoactive bowel sounds  11/28/16 1300 Received call back from Dr. Barry Dienes. No new orders at this time

## 2016-11-28 NOTE — Progress Notes (Signed)
5 Days Post-Op   Subjective/Chief Complaint: No flatus.  Feels a little tight.  No fever for 24 hours.    Objective: Vital signs in last 24 hours: Temp:  [97.8 F (36.6 C)-100.8 F (38.2 C)] 100.8 F (38.2 C) (07/31 0756) Pulse Rate:  [114] 114 (07/30 2312) Resp:  [19-28] 26 (07/31 0756) BP: (118-156)/(51-100) 139/76 (07/31 0756) SpO2:  [90 %-98 %] 90 % (07/31 0436) Last BM Date: 11/23/16  Intake/Output from previous day: 07/30 0701 - 07/31 0700 In: 4220 [P.O.:240; I.V.:3000; NG/GT:480; IV Piggyback:500] Out: 6415 [Urine:1625; Drains:30] Intake/Output this shift: No intake/output data recorded.  PE: General: No distress.  A little sleepy. Lungs:breathing comfortably Abd: soft, non distended.  Drains slightly bilious.  Output continuing to trend down.   Dressing c/d/i Extremities: No clinical signs or symptoms of DVT  Neuro: Intact Lab Results:   Recent Labs  11/27/16 0210 11/28/16 0153  WBC 29.5* 28.8*  HGB 9.6* 8.5*  HCT 29.3* 25.6*  PLT 272 238   BMET  Recent Labs  11/26/16 0242  NA 133*  K 4.4  CL 103  CO2 25  GLUCOSE 115*  BUN 9  CREATININE 0.88  CALCIUM 7.4*   PT/INR No results for input(s): LABPROT, INR in the last 72 hours. ABG No results for input(s): PHART, HCO3 in the last 72 hours.  Invalid input(s): PCO2, PO2  Studies/Results: No results found.  Anti-infectives: Anti-infectives    Start     Dose/Rate Route Frequency Ordered Stop   11/28/16 1200  fluconazole (DIFLUCAN) IVPB 400 mg     400 mg 100 mL/hr over 120 Minutes Intravenous Every 24 hours 11/27/16 1115     11/27/16 1200  piperacillin-tazobactam (ZOSYN) IVPB 3.375 g     3.375 g 12.5 mL/hr over 240 Minutes Intravenous Every 8 hours 11/27/16 1109     11/27/16 1200  fluconazole (DIFLUCAN) IVPB 800 mg     800 mg 100 mL/hr over 240 Minutes Intravenous  Once 11/27/16 1114 11/27/16 1616   11/23/16 2000  ceFAZolin (ANCEF) IVPB 2g/100 mL premix     2 g 200 mL/hr over 30 Minutes  Intravenous Every 8 hours 11/23/16 1520 11/23/16 2228   11/23/16 0600  ceFAZolin (ANCEF) IVPB 2g/100 mL premix     2 g 200 mL/hr over 30 Minutes Intravenous To ShortStay Surgical 11/22/16 0814 11/23/16 1205      Assessment/Plan: s/p Procedure(s): WHIPPLE PROCEDURE (N/A)  Sips of clears D/c epidural Zosyn/fluc  Path negative. Still tachycardic to 120s.  Leave in icu. D/c foley. Start lovenox today.   LOS: 5 days    [facility] 11/28/2016

## 2016-11-29 LAB — COMPREHENSIVE METABOLIC PANEL
ALT: 35 U/L (ref 14–54)
AST: 29 U/L (ref 15–41)
Albumin: 1.8 g/dL — ABNORMAL LOW (ref 3.5–5.0)
Alkaline Phosphatase: 185 U/L — ABNORMAL HIGH (ref 38–126)
Anion gap: 6 (ref 5–15)
BILIRUBIN TOTAL: 3 mg/dL — AB (ref 0.3–1.2)
BUN: 10 mg/dL (ref 6–20)
CHLORIDE: 97 mmol/L — AB (ref 101–111)
CO2: 24 mmol/L (ref 22–32)
CREATININE: 0.73 mg/dL (ref 0.44–1.00)
Calcium: 7.4 mg/dL — ABNORMAL LOW (ref 8.9–10.3)
GFR calc Af Amer: >60 mL/min (ref >60)
GLUCOSE: 130 mg/dL — AB (ref 65–99)
Potassium: 4.1 mmol/L (ref 3.5–5.1)
SODIUM: 127 mmol/L — AB (ref 135–145)
TOTAL PROTEIN: 5.4 g/dL — AB (ref 6.5–8.1)

## 2016-11-29 LAB — CBC
HCT: 25.1 % — ABNORMAL LOW (ref 36.0–46.0)
Hemoglobin: 8.5 g/dL — ABNORMAL LOW (ref 12.0–15.0)
MCH: 29.6 pg (ref 26.0–34.0)
MCHC: 33.9 g/dL (ref 30.0–36.0)
MCV: 87.5 fL (ref 78.0–100.0)
PLATELETS: 236 10*3/uL (ref 150–400)
RBC: 2.87 MIL/uL — ABNORMAL LOW (ref 3.87–5.11)
RDW: 13.7 % (ref 11.5–15.5)
WBC: 38.5 10*3/uL — ABNORMAL HIGH (ref 4.0–10.5)

## 2016-11-29 MED ORDER — CITALOPRAM HYDROBROMIDE 20 MG PO TABS
20.0000 mg | ORAL_TABLET | Freq: Every day | ORAL | Status: DC
  Administered 2016-11-30 – 2016-12-09 (×10): 20 mg via ORAL
  Filled 2016-11-29 (×10): qty 1

## 2016-11-29 MED ORDER — VANCOMYCIN HCL 10 G IV SOLR
2000.0000 mg | Freq: Once | INTRAVENOUS | Status: AC
  Administered 2016-11-29: 2000 mg via INTRAVENOUS
  Filled 2016-11-29: qty 2000

## 2016-11-29 MED ORDER — VANCOMYCIN HCL IN DEXTROSE 1-5 GM/200ML-% IV SOLN
1000.0000 mg | Freq: Three times a day (TID) | INTRAVENOUS | Status: DC
  Administered 2016-11-29 – 2016-12-01 (×5): 1000 mg via INTRAVENOUS
  Filled 2016-11-29 (×8): qty 200

## 2016-11-29 MED ORDER — BISACODYL 5 MG PO TBEC
10.0000 mg | DELAYED_RELEASE_TABLET | Freq: Once | ORAL | Status: AC
  Administered 2016-11-29: 10 mg via ORAL
  Filled 2016-11-29: qty 2

## 2016-11-29 NOTE — Progress Notes (Signed)
6 Days Post-Op   Subjective/Chief Complaint: Still no flatus.  Epidural d/c'd and pain still controlled with PCA.  Has been able to void after foley removal.  Pt has tightness of abdomen.    Objective: Vital signs in last 24 hours: Temp:  [97.8 F (36.6 C)-98.7 F (37.1 C)] 97.8 F (36.6 C) (08/01 0805) Pulse Rate:  [108-117] 110 (08/01 0815) Resp:  [18-36] 23 (08/01 0816) BP: (152-180)/(70-128) 159/96 (08/01 0540) SpO2:  [90 %-100 %] 100 % (08/01 0816) Last BM Date: 11/23/16  Intake/Output from previous day: 07/31 0701 - 08/01 0700 In: 2743.3 [I.V.:1963.3; NG/GT:480; IV Piggyback:300] Out: 1740 [Urine:1685; Drains:55] Intake/Output this shift: No intake/output data recorded.  PE: General: No distress.  More perky today.   Lungs:breathing comfortably Abd: soft, non distended.  Drains slightly bilious.  Output low.   Dressing c/d/i Extremities: No clinical signs or symptoms of DVT  Neuro: Intact Lab Results:   Recent Labs  11/28/16 0153 11/29/16 0235  WBC 28.8* 38.5*  HGB 8.5* 8.5*  HCT 25.6* 25.1*  PLT 238 236   BMET  Recent Labs  11/29/16 0235  NA 127*  K 4.1  CL 97*  CO2 24  GLUCOSE 130*  BUN 10  CREATININE 0.73  CALCIUM 7.4*   PT/INR No results for input(s): LABPROT, INR in the last 72 hours. ABG No results for input(s): PHART, HCO3 in the last 72 hours.  Invalid input(s): PCO2, PO2  Studies/Results: Dg Chest Port 1 View  Result Date: 11/28/2016 CLINICAL DATA:  Shortness of Breath EXAM: PORTABLE CHEST 1 VIEW COMPARISON:  01/14/2016 FINDINGS: Low lung volumes with bibasilar atelectasis. Heart is normal size. Cannot exclude small effusions. No acute bony abnormality. IMPRESSION: Low volumes with bibasilar atelectasis.  Question small effusions. Electronically Signed   By: Rolm Baptise M.D.   On: 11/28/2016 11:26    Anti-infectives: Anti-infectives    Start     Dose/Rate Route Frequency Ordered Stop   11/29/16 1600  vancomycin (VANCOCIN) IVPB  1000 mg/200 mL premix     1,000 mg 200 mL/hr over 60 Minutes Intravenous Every 8 hours 11/29/16 0656     11/29/16 0700  vancomycin (VANCOCIN) 2,000 mg in sodium chloride 0.9 % 500 mL IVPB     2,000 mg 250 mL/hr over 120 Minutes Intravenous  Once 11/29/16 0656     11/28/16 1200  fluconazole (DIFLUCAN) IVPB 400 mg     400 mg 100 mL/hr over 120 Minutes Intravenous Every 24 hours 11/27/16 1115     11/27/16 1200  piperacillin-tazobactam (ZOSYN) IVPB 3.375 g     3.375 g 12.5 mL/hr over 240 Minutes Intravenous Every 8 hours 11/27/16 1109     11/27/16 1200  fluconazole (DIFLUCAN) IVPB 800 mg     800 mg 100 mL/hr over 240 Minutes Intravenous  Once 11/27/16 1114 11/27/16 1616   11/23/16 2000  ceFAZolin (ANCEF) IVPB 2g/100 mL premix     2 g 200 mL/hr over 30 Minutes Intravenous Every 8 hours 11/23/16 1520 11/23/16 2228   11/23/16 0600  ceFAZolin (ANCEF) IVPB 2g/100 mL premix     2 g 200 mL/hr over 30 Minutes Intravenous To ShortStay Surgical 11/22/16 0814 11/23/16 1205      Assessment/Plan: s/p Procedure(s): WHIPPLE PROCEDURE (N/A)  Sips of clears Continue PCA. Zosyn/fluc.  Add vanc.  Path negative. Dulcolax Transfer to floor..   LOS: 6 days    Krystal Reynolds 11/29/2016

## 2016-11-29 NOTE — Progress Notes (Signed)
Pharmacy Antibiotic Note  Krystal Reynolds is a 46 y.o. 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  Pharmacy has been consulted for vancomycin dosing.  Plan: Vancomycin 2000mg  x1 then 1000mg  IV every 8 hours.  Goal trough 15-20 mcg/mL.  Height: 5\' 7"  (170.2 cm) Weight: 238 lb 15.7 oz (108.4 kg) IBW/kg (Calculated) : 61.6  Temp (24hrs), Avg:98.9 F (37.2 C), Min:98.2 F (36.8 C), Max:100.8 F (38.2 C)   Recent Labs Lab 11/23/16 1547 11/24/16 0531 11/25/16 0317 11/26/16 0242 11/27/16 0210 11/28/16 0153 11/29/16 0235  WBC 23.3* 25.9* 35.8* 33.8* 29.5* 28.8* 38.5*  CREATININE 1.06* 0.77  --  0.88  --   --  0.73    Estimated Creatinine Clearance: 112.6 mL/min (by C-G formula based on SCr of 0.73 mg/dL).    No Known Allergies  Antimicrobials this admission: Zosyn 7/30 >>  Fluconazole 7/30 >>  Vanc 8/1 >>    Thank you for allowing pharmacy to be a part of this patient's care.  Wynona Neat, PharmD, BCPS  11/29/2016 6:53 AM

## 2016-11-29 NOTE — Plan of Care (Signed)
Problem: Bowel/Gastric: Goal: Gastrointestinal status for postoperative course will improve Outcome: Not Progressing Patient has not had a bowel movement since before surgery. MD aware and ordered laxative.

## 2016-11-30 LAB — CREATININE, SERUM
CREATININE: 0.71 mg/dL (ref 0.44–1.00)
GFR calc Af Amer: >60 mL/min (ref >60)
GFR calc non Af Amer: >60 mL/min (ref >60)

## 2016-11-30 MED ORDER — POTASSIUM CHLORIDE IN NACL 20-0.9 MEQ/L-% IV SOLN
INTRAVENOUS | Status: AC
  Administered 2016-11-30 – 2016-12-02 (×3): via INTRAVENOUS
  Filled 2016-11-30 (×3): qty 1000

## 2016-11-30 MED ORDER — BISACODYL 10 MG RE SUPP
10.0000 mg | Freq: Once | RECTAL | Status: AC
  Administered 2016-11-30: 10 mg via RECTAL
  Filled 2016-11-30: qty 1

## 2016-11-30 NOTE — Progress Notes (Signed)
Initial Nutrition Assessment  DOCUMENTATION CODES:   Obesity unspecified  INTERVENTION:   -Continue Pivot 1.5 @ 20 ml/hr via j-port  -If long term feedings are anticipated, consider change in formula to Jevity 1.5 and to increase to goal rate of 45 ml/hr with 30 ml Prostat TID. Tube feeding regimen would provide 1920 kcal (100% of needs), 114 grams of protein, and 821 ml of H2O.   -RD will follow for diet advancement and supplement as appropriate  NUTRITION DIAGNOSIS:   Inadequate oral intake related to altered GI function as evidenced by  (NPO/clear liquid diet x 7 days, j-tube dependent).  GOAL:   Patient will meet greater than or equal to 90% of their needs  MONITOR:   PO intake, Diet advancement, Labs, Weight trends, Skin, I & O's  REASON FOR ASSESSMENT:   NPO/Clear Liquid Diet (TF)    ASSESSMENT:   The patient is a 46 year old female who presents with a pancreatic mass.  S/p PROCEDURES PERFORMED 7/26:  Classic pancreaticoduodenectomy  Placement of biliary stent (segment of 8 Fr pediatric feeding tube) Placement of pancreatic duct stent (segment of 5 Fr pediatric feeding tube 18 Fr Jejunostomy feeding tube  7/28- TF initiated (Pivot 1.5 @ 10 ml/hr) 7/29- TF increased (Pivot 1.5 @ 20 ml/hr) 8/1- sips and chips added  Case discussed with RN, who reports pt is tolerating TF well. RN reports no plans for diet or TF advancement today.   Spoke with pt, who is in good spirits. She reports a very good appetite PTA, she shares that she has been focusing on lifestyle changes to help manage weight (mainly cutting out sodas and caffeine). She shares that she is tolerating clear liquids well, however, complains of taste change ("the tea is too sweet, I prefer drinking it when it is diluted by the melted ice"). She denies any nausea, vomiting, or abdominal pain with TF or eating.  Pt denies recent wt loss. She shares that she has lost 30# within the past 3 years due to  lifestyle changes.   Nutrition-Focused physical exam completed. Findings are no fat depletion, no muscle depletion, and no edema.   Pt currently receiving Pivot 1.5 @ 20 ml/hr via j tube, which provides 720 kcals, 45 grams protein, and 364 ml free water daily (40% of estimated kcal needs and 43% of estimated protein needs).   Pt had BM this afternoon; stool softener and suppository added on 11/28/16 and 11/29/16, respectively.   Per MD notes, pathology negative for malignancy.   Labs reviewed: Na: 127.   Diet Order:  Diet clear liquid Room service appropriate? Yes; Fluid consistency: Thin; Fluid restriction: Other (see comments)  Skin:   (closed abdominal incision)  Last BM:  11/30/16  Height:   Ht Readings from Last 1 Encounters:  11/29/16 5\' 7"  (1.702 m)    Weight:   Wt Readings from Last 1 Encounters:  11/29/16 248 lb 14.4 oz (112.9 kg)    Ideal Body Weight:  61.4 kg  BMI:  Body mass index is 38.98 kg/m.  Estimated Nutritional Needs:   Kcal:  1800-2000  Protein:  110-125 grams  Fluid:  1.8-2.0 L  EDUCATION NEEDS:   No education needs identified at this time  Rami Budhu A. Jimmye Norman, RD, LDN, CDE Pager: [phone] After hours Pager: [phone]

## 2016-11-30 NOTE — Progress Notes (Signed)
7 Days Post-Op   Subjective/Chief Complaint: Still no BM.  No n/v.  Still feels tight.  No fever >100 in last 24 hours.    Objective: Vital signs in last 24 hours: Temp:  [97.6 F (36.4 C)-99.9 F (37.7 C)] 98.4 F (36.9 C) (08/02 1209) Pulse Rate:  [105-115] 105 (08/02 1209) Resp:  [18-28] 24 (08/02 1209) BP: (153-161)/(70-100) 154/72 (08/02 1209) SpO2:  [90 %-94 %] 94 % (08/02 1209) Weight:  [112.9 kg (248 lb 14.4 oz)] 112.9 kg (248 lb 14.4 oz) (08/01 1458) Last BM Date: 11/23/16  Intake/Output from previous day: 08/01 0701 - 08/02 0700 In: 3448.9 [P.O.:480; I.V.:1763.6; NG/GT:455.3; IV Piggyback:750] Out: 2750 [Urine:2650; Drains:100] Intake/Output this shift: Total I/O In: 236.3 [I.V.:236.3] Out: 500 [Urine:500]  PE: General: No distress. Sleepy.   Lungs:breathing comfortably Abd: soft, non distended.  Drains slightly bilious.   Dressing c/d/i Extremities: No clinical signs or symptoms of DVT  Neuro: Intact Lab Results:   Recent Labs  11/28/16 0153 11/29/16 0235  WBC 28.8* 38.5*  HGB 8.5* 8.5*  HCT 25.6* 25.1*  PLT 238 236   BMET  Recent Labs  11/29/16 0235 11/30/16 0534  NA 127*  --   K 4.1  --   CL 97*  --   CO2 24  --   GLUCOSE 130*  --   BUN 10  --   CREATININE 0.73 0.71  CALCIUM 7.4*  --    PT/INR No results for input(s): LABPROT, INR in the last 72 hours. ABG No results for input(s): PHART, HCO3 in the last 72 hours.  Invalid input(s): PCO2, PO2  Studies/Results: No results found.  Anti-infectives: Anti-infectives    Start     Dose/Rate Route Frequency Ordered Stop   11/29/16 1600  vancomycin (VANCOCIN) IVPB 1000 mg/200 mL premix     1,000 mg 200 mL/hr over 60 Minutes Intravenous Every 8 hours 11/29/16 0656     11/29/16 0700  vancomycin (VANCOCIN) 2,000 mg in sodium chloride 0.9 % 500 mL IVPB     2,000 mg 250 mL/hr over 120 Minutes Intravenous  Once 11/29/16 0656 11/29/16 1015   11/28/16 1200  fluconazole (DIFLUCAN) IVPB 400  mg     400 mg 100 mL/hr over 120 Minutes Intravenous Every 24 hours 11/27/16 1115     11/27/16 1200  piperacillin-tazobactam (ZOSYN) IVPB 3.375 g     3.375 g 12.5 mL/hr over 240 Minutes Intravenous Every 8 hours 11/27/16 1109     11/27/16 1200  fluconazole (DIFLUCAN) IVPB 800 mg     800 mg 100 mL/hr over 240 Minutes Intravenous  Once 11/27/16 1114 11/27/16 1616   11/23/16 2000  ceFAZolin (ANCEF) IVPB 2g/100 mL premix     2 g 200 mL/hr over 30 Minutes Intravenous Every 8 hours 11/23/16 1520 11/23/16 2228   11/23/16 0600  ceFAZolin (ANCEF) IVPB 2g/100 mL premix     2 g 200 mL/hr over 30 Minutes Intravenous To ShortStay Surgical 11/22/16 0814 11/23/16 1205      Assessment/Plan: s/p Procedure(s): WHIPPLE PROCEDURE (N/A) Hyponatremia - switch to NS. Sips of clears Continue PCA. Zosyn/fluc/vanc.  Path negative for malignancy.  Try suppository today.   LOS: 7 days    Amalia Edgecombe 11/30/2016

## 2016-12-01 ENCOUNTER — Inpatient Hospital Stay (HOSPITAL_COMMUNITY): Payer: BC Managed Care – PPO

## 2016-12-01 ENCOUNTER — Encounter (HOSPITAL_COMMUNITY): Payer: Self-pay | Admitting: Radiology

## 2016-12-01 LAB — BASIC METABOLIC PANEL
Anion gap: 5 (ref 5–15)
BUN: 8 mg/dL (ref 6–20)
CALCIUM: 7.6 mg/dL — AB (ref 8.9–10.3)
CO2: 28 mmol/L (ref 22–32)
Chloride: 97 mmol/L — ABNORMAL LOW (ref 101–111)
Creatinine, Ser: 0.62 mg/dL (ref 0.44–1.00)
GFR calc Af Amer: >60 mL/min (ref >60)
Glucose, Bld: 120 mg/dL — ABNORMAL HIGH (ref 65–99)
POTASSIUM: 4.3 mmol/L (ref 3.5–5.1)
SODIUM: 130 mmol/L — AB (ref 135–145)

## 2016-12-01 LAB — CBC
HCT: 22.3 % — ABNORMAL LOW (ref 36.0–46.0)
Hemoglobin: 7.5 g/dL — ABNORMAL LOW (ref 12.0–15.0)
MCH: 29.1 pg (ref 26.0–34.0)
MCHC: 33.6 g/dL (ref 30.0–36.0)
MCV: 86.4 fL (ref 78.0–100.0)
PLATELETS: 332 10*3/uL (ref 150–400)
RBC: 2.58 MIL/uL — AB (ref 3.87–5.11)
RDW: 14.6 % (ref 11.5–15.5)
WBC: 45.7 10*3/uL — AB (ref 4.0–10.5)

## 2016-12-01 LAB — VANCOMYCIN, TROUGH: VANCOMYCIN TR: 13 ug/mL — AB (ref 15–20)

## 2016-12-01 MED ORDER — ALUM & MAG HYDROXIDE-SIMETH 200-200-20 MG/5ML PO SUSP
15.0000 mL | ORAL | Status: DC | PRN
  Administered 2016-12-01: 15 mL via ORAL
  Filled 2016-12-01: qty 30

## 2016-12-01 MED ORDER — SIMETHICONE 80 MG PO CHEW
40.0000 mg | CHEWABLE_TABLET | ORAL | Status: DC | PRN
  Administered 2016-12-01: 40 mg via ORAL
  Filled 2016-12-01: qty 1

## 2016-12-01 MED ORDER — IOPAMIDOL (ISOVUE-300) INJECTION 61%
15.0000 mL | INTRAVENOUS | Status: AC
  Administered 2016-12-01 (×2): 15 mL via ORAL

## 2016-12-01 MED ORDER — IOPAMIDOL (ISOVUE-300) INJECTION 61%
INTRAVENOUS | Status: AC
  Administered 2016-12-01: 100 mL
  Filled 2016-12-01: qty 100

## 2016-12-01 MED ORDER — IOPAMIDOL (ISOVUE-300) INJECTION 61%
INTRAVENOUS | Status: AC
  Filled 2016-12-01: qty 30

## 2016-12-01 MED ORDER — VANCOMYCIN HCL 10 G IV SOLR
1250.0000 mg | Freq: Three times a day (TID) | INTRAVENOUS | Status: DC
  Administered 2016-12-01 – 2016-12-07 (×18): 1250 mg via INTRAVENOUS
  Filled 2016-12-01 (×19): qty 1250

## 2016-12-01 NOTE — Progress Notes (Signed)
8 Days Post-Op   Subjective/Chief Complaint: Had BM.  Afebrile still, but WBCs higher.  Still tight.   Tolerating clear liquids. Objective: Vital signs in last 24 hours: Temp:  [97.7 F (36.5 C)-98.9 F (37.2 C)] 98.6 F (37 C) (08/03 0553) Pulse Rate:  [94-106] 99 (08/03 0553) Resp:  [17-29] 17 (08/03 0800) BP: (132-156)/(74-98) 152/98 (08/03 0553) SpO2:  [91 %-97 %] 95 % (08/03 0800) Last BM Date: 11/30/16  Intake/Output from previous day: 08/02 0701 - 08/03 0700 In: 3843.9 [P.O.:490; I.V.:1470.3; NG/GT:968.7; IV Piggyback:900] Out: 1469 [Urine:1002; Drains:465; Stool:2] Intake/Output this shift: Total I/O In: 360 [P.O.:360] Out: 300 [Drains:300]  PE: General: No distress. Alert.     Lungs:breathing comfortably Abd: soft, non distended.  Drains slightly bilious.   Dressing c/d/i Extremities: No clinical signs or symptoms of DVT  Neuro: Intact Lab Results:   Recent Labs  11/29/16 0235 12/01/16 0654  WBC 38.5* 45.7*  HGB 8.5* 7.5*  HCT 25.1* 22.3*  PLT 236 332   BMET  Recent Labs  11/29/16 0235 11/30/16 0534 12/01/16 0654  NA 127*  --  130*  K 4.1  --  4.3  CL 97*  --  97*  CO2 24  --  28  GLUCOSE 130*  --  120*  BUN 10  --  8  CREATININE 0.73 0.71 0.62  CALCIUM 7.4*  --  7.6*   PT/INR No results for input(s): LABPROT, INR in the last 72 hours. ABG No results for input(s): PHART, HCO3 in the last 72 hours.  Invalid input(s): PCO2, PO2  Studies/Results: No results found.  Anti-infectives: Anti-infectives    Start     Dose/Rate Route Frequency Ordered Stop   12/01/16 0900  vancomycin (VANCOCIN) 1,250 mg in sodium chloride 0.9 % 250 mL IVPB     1,250 mg 166.7 mL/hr over 90 Minutes Intravenous Every 8 hours 12/01/16 0821     11/29/16 1600  vancomycin (VANCOCIN) IVPB 1000 mg/200 mL premix  Status:  Discontinued     1,000 mg 200 mL/hr over 60 Minutes Intravenous Every 8 hours 11/29/16 0656 12/01/16 0821   11/29/16 0700  vancomycin (VANCOCIN)  2,000 mg in sodium chloride 0.9 % 500 mL IVPB     2,000 mg 250 mL/hr over 120 Minutes Intravenous  Once 11/29/16 0656 11/29/16 1015   11/28/16 1200  fluconazole (DIFLUCAN) IVPB 400 mg     400 mg 100 mL/hr over 120 Minutes Intravenous Every 24 hours 11/27/16 1115     11/27/16 1200  piperacillin-tazobactam (ZOSYN) IVPB 3.375 g     3.375 g 12.5 mL/hr over 240 Minutes Intravenous Every 8 hours 11/27/16 1109     11/27/16 1200  fluconazole (DIFLUCAN) IVPB 800 mg     800 mg 100 mL/hr over 240 Minutes Intravenous  Once 11/27/16 1114 11/27/16 1616   11/23/16 2000  ceFAZolin (ANCEF) IVPB 2g/100 mL premix     2 g 200 mL/hr over 30 Minutes Intravenous Every 8 hours 11/23/16 1520 11/23/16 2228   11/23/16 0600  ceFAZolin (ANCEF) IVPB 2g/100 mL premix     2 g 200 mL/hr over 30 Minutes Intravenous To ShortStay Surgical 11/22/16 0814 11/23/16 1205      Assessment/Plan: s/p Procedure(s): WHIPPLE PROCEDURE (N/A) Hyponatremia - switch to NS. Sips of clears Continue PCA. Zosyn/fluc/vanc.  Path negative for malignancy.  Ct to evaluate for undrained fluid collection.  If positive, will need perc drain.  Hold on diet advance until we see results of scan.   LOS: 8  days    [facility] 12/01/2016

## 2016-12-01 NOTE — Progress Notes (Signed)
Pharmacy Antibiotic Note  Krystal Reynolds is a 46 y.o. 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  Pharmacy has been consulted for vancomycin dosing.  Vancomycin trough this AM is low 13 (drawn 1hr early). WBC is rising. SCr is stable. Patient remains afebrile.  No cultures available.  Pathology negative for malignancy.  Plan: Increase Vancomycin 1250mg  IV every 8 hours for goal trough 15-20 mcg/mL. Vancomycin trough level at steady state. Continue Zosyn 3.375g IV every 8 hours- each dose infused over 4 hours.  Continue Fluconazole 400mg  IV every 24 hours.  Follow-up plan for length of therapy.   Height: 5\' 7"  (170.2 cm) Weight: 248 lb 14.4 oz (112.9 kg) IBW/kg (Calculated) : 61.6  Temp (24hrs), Avg:98.3 F (36.8 C), Min:97.7 F (36.5 C), Max:98.9 F (37.2 C)   Recent Labs Lab 11/26/16 0242 11/27/16 0210 11/28/16 0153 11/29/16 0235 11/30/16 0534 12/01/16 0654  WBC 33.8* 29.5* 28.8* 38.5*  --  45.7*  CREATININE 0.88  --   --  0.73 0.71 0.62  VANCOTROUGH  --   --   --   --   --  13*    Estimated Creatinine Clearance: 115.1 mL/min (by C-G formula based on SCr of 0.62 mg/dL).    No Known Allergies  Antimicrobials this admission: Zosyn 7/30 >>  Fluconazole 7/30 >>  Vanc 8/1 >>    Thank you for allowing pharmacy to be a part of this patient's care.  Sloan Leiter, PharmD, BCPS Clinical Pharmacist Clinical phone 12/01/2016 until 3:30 PM - [phone] After hours, please call #[phone]  12/01/2016 8:13 AM

## 2016-12-02 ENCOUNTER — Inpatient Hospital Stay (HOSPITAL_COMMUNITY): Payer: BC Managed Care – PPO

## 2016-12-02 LAB — BASIC METABOLIC PANEL
ANION GAP: 6 (ref 5–15)
BUN: 7 mg/dL (ref 6–20)
CALCIUM: 7.8 mg/dL — AB (ref 8.9–10.3)
CO2: 27 mmol/L (ref 22–32)
Chloride: 97 mmol/L — ABNORMAL LOW (ref 101–111)
Creatinine, Ser: 0.65 mg/dL (ref 0.44–1.00)
GLUCOSE: 92 mg/dL (ref 65–99)
POTASSIUM: 4.2 mmol/L (ref 3.5–5.1)
Sodium: 130 mmol/L — ABNORMAL LOW (ref 135–145)

## 2016-12-02 LAB — CBC
HCT: 23.6 % — ABNORMAL LOW (ref 36.0–46.0)
HEMOGLOBIN: 7.8 g/dL — AB (ref 12.0–15.0)
MCH: 29.3 pg (ref 26.0–34.0)
MCHC: 33.1 g/dL (ref 30.0–36.0)
MCV: 88.7 fL (ref 78.0–100.0)
Platelets: 398 10*3/uL (ref 150–400)
RBC: 2.66 MIL/uL — ABNORMAL LOW (ref 3.87–5.11)
RDW: 15.4 % (ref 11.5–15.5)
WBC: 38.3 10*3/uL — ABNORMAL HIGH (ref 4.0–10.5)

## 2016-12-02 LAB — VANCOMYCIN, TROUGH: Vancomycin Tr: 20 ug/mL (ref 15–20)

## 2016-12-02 LAB — PHOSPHORUS: Phosphorus: 3.4 mg/dL (ref 2.5–4.6)

## 2016-12-02 LAB — GLUCOSE, CAPILLARY: GLUCOSE-CAPILLARY: 101 mg/dL — AB (ref 65–99)

## 2016-12-02 LAB — MAGNESIUM: Magnesium: 2.1 mg/dL (ref 1.7–2.4)

## 2016-12-02 MED ORDER — POTASSIUM CHLORIDE IN NACL 20-0.9 MEQ/L-% IV SOLN: INTRAVENOUS | Status: DC

## 2016-12-02 MED ORDER — TRACE MINERALS CR-CU-MN-SE-ZN 10-1000-500-60 MCG/ML IV SOLN
INTRAVENOUS | Status: AC
  Administered 2016-12-02: 18:00:00 via INTRAVENOUS
  Filled 2016-12-02: qty 960

## 2016-12-02 MED ORDER — INSULIN ASPART 100 UNIT/ML ~~LOC~~ SOLN
0.0000 [IU] | Freq: Four times a day (QID) | SUBCUTANEOUS | Status: DC
  Administered 2016-12-03 (×3): 1 [IU] via SUBCUTANEOUS
  Administered 2016-12-04: 2 [IU] via SUBCUTANEOUS
  Administered 2016-12-04 (×3): 1 [IU] via SUBCUTANEOUS
  Administered 2016-12-05: 2 [IU] via SUBCUTANEOUS
  Administered 2016-12-05 – 2016-12-06 (×4): 1 [IU] via SUBCUTANEOUS

## 2016-12-02 MED ORDER — SODIUM CHLORIDE 0.9% FLUSH
10.0000 mL | INTRAVENOUS | Status: DC | PRN
  Administered 2016-12-05: 10 mL
  Filled 2016-12-02: qty 40

## 2016-12-02 MED ORDER — FAT EMULSION 20 % IV EMUL
240.0000 mL | INTRAVENOUS | Status: AC
  Administered 2016-12-02: 240 mL via INTRAVENOUS
  Filled 2016-12-02: qty 250

## 2016-12-02 NOTE — Progress Notes (Signed)
Tube feeding stopped per Dr. Josetta Huddle order at 2015. J-tube flushed. Will resume tube feeding in 8 hours per Dr. Josetta Huddle order.

## 2016-12-02 NOTE — Progress Notes (Addendum)
Nutrition Follow-up  DOCUMENTATION CODES:  Obesity unspecified  INTERVENTION:  TPN per pharmacy-monitor for re-initiation of TF/Oral intake  NUTRITION DIAGNOSIS:  Inadequate oral intake related to altered GI function (S/P whipple w/ likely leak at pancreatic or biliary anastomosis.) as evidenced by  Inability to tolerate PO intake/TF and need for TPN per surgeon.  GOAL:  Patient will meet greater than or equal to 90% of their needs  MONITOR:  Diet advancement, Vent status, Labs, Weight trends, I & O's  REASON FOR ASSESSMENT:  Consult New TPN/TNA  ASSESSMENT:  46 year old female PMHx HTN, renal cancer s/p bilateral partial nephrectomy and asymptomatic pancreatic mass. 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 Lesion has grown and due to risk of renal cancer, elected to undergo whipple w/ cholecystectomy and placement of jejunostomy 11/23/16.   S/p PROCEDURES PERFORMED 7/26:  Classic pancreaticoduodenectomy  Placement of biliary stent (segment of 8 Fr pediatric feeding tube) Placement of pancreatic duct stent (segment of 5 Fr pediatric feeding tube 18 Fr Jejunostomy feeding tube  7/28- TF initiated (Pivot 1.5 @ 10 ml/hr) 7/29- TF increased (Pivot 1.5 @ 20 ml/hr) 8/1- sips and chips added. Abdomen "feels tight" 8/3 - TF stopped due to bloating/discomfort.  8/4-  likely leak at pancreatic or biliary anastomosis. Unable to tolerate PO intake -> TPN  Weight at preadmission testing appointment was 222 lbs. Has significant fluid.   Today, pt states that she feels so much better since her TF was stopped. RD urged her to continue to drink the clear liquids as allowed to minimize gut atrophy. SHe said she would  Labs: WBC: 38.3, H/H:7.8/23.6, Sodium:130, Albumin:1.8, BGs 90-140 Meds: Dilaudid, ppi, IVF, Diflucan, IV ABx,    Recent Labs Lab 11/29/16 0235 11/30/16 0534 12/01/16 0654 12/02/16 0803 12/02/16 0805  NA 127*  --  130* 130*  --   K 4.1  --  4.3 4.2  --   CL 97*  --   97* 97*  --   CO2 24  --  28 27  --   BUN 10  --  8 7  --   CREATININE 0.73 0.71 0.62 0.65  --   CALCIUM 7.4*  --  7.6* 7.8*  --   MG  --   --   --   --  2.1  PHOS  --   --   --   --  3.4  GLUCOSE 130*  --  120* 92  --    Diet Order:  Diet clear liquid Room service appropriate? Yes; Fluid consistency: Thin; Fluid restriction: Other (see comments) TPN (CLINIMIX-E) Adult  Skin: Surgical Incision to abdomen  Last BM:  8/1  Height:  Ht Readings from Last 1 Encounters:  11/29/16 5\' 7"  (1.702 m)   Weight:  Wt Readings from Last 1 Encounters:  11/29/16 248 lb 14.4 oz (112.9 kg)   Wt Readings from Last 10 Encounters:  11/29/16 248 lb 14.4 oz (112.9 kg)  11/15/16 222 lb 8 oz (100.9 kg)  04/11/16 210 lb (95.3 kg)  10/02/13 256 lb 12.5 oz (116.5 kg)  09/24/13 257 lb (116.6 kg)  01/08/12 243 lb 13.3 oz (110.6 kg)  01/02/12 244 lb (110.7 kg)  12/07/11 248 lb (112.5 kg)  10/30/11 246 lb 12.8 oz (111.9 kg)  09/30/11 248 lb (112.5 kg)   Ideal Body Weight:  61.4 kg  BMI:  Body mass index is 38.98 kg/m.  Estimated Nutritional Needs:  Kcal:  1800-2000 Protein:  110-125 grams Fluid:  1.8-2.0  L  EDUCATION NEEDS:  No education needs identified at this time  Burtis Junes RD, LDN, [place] [facility] Pager: [phone] 12/02/2016 3:15 PM

## 2016-12-02 NOTE — Progress Notes (Signed)
Patient states her bloating and discomfort has decreased since turning off the tube feed. Requested to keep tube feed off for the remainder of the morning. Spoke with Dr. Brantley Stage about this, who stated it was okay to leave tube feed off for now.

## 2016-12-02 NOTE — Progress Notes (Signed)
Patient ID: Krystal Reynolds, female   DOB: [birth year], 46 y.o.   MRN: [id number] 9 Days Post-Op   Subjective: Doesn't feel well. Abdomen became very tight overnight and tube feeding held. This helped somewhat. Still feels tight and moderately painful.  Objective: Vital signs in last 24 hours: Temp:  [97.7 F (36.5 C)-98.7 F (37.1 C)] 97.8 F (36.6 C) (08/04 0630) Pulse Rate:  [87-96] 87 (08/04 0630) Resp:  [18-29] 22 (08/04 0808) BP: (127-140)/(62-78) 127/78 (08/04 0630) SpO2:  [93 %-97 %] 96 % (08/04 0808) Last BM Date: 11/29/16  Intake/Output from previous day: 08/03 0701 - 08/04 0700 In: 4453.7 [P.O.:1420; I.V.:1615; NG/GT:318.7; IV Piggyback:1100] Out: 1005 [Drains:1005] Intake/Output this shift: No intake/output data recorded.  General appearance: alert, cooperative and mild distress Resp: Clear breath sounds. Somewhat shallow respirations. GI: Obese. Probable moderate distention. Mild diffuse tenderness. Right upper quadrant drains one with purulent and 1 with cloudy bilious/old blood drainage Incision/Wound: Intact. No erythema or drainage.  Lab Results:   Recent Labs  12/01/16 0654 12/02/16 0803  WBC 45.7* 38.3*  HGB 7.5* 7.8*  HCT 22.3* 23.6*  PLT 332 398   BMET  Recent Labs  12/01/16 0654 12/02/16 0803  NA 130* 130*  K 4.3 4.2  CL 97* 97*  CO2 28 27  GLUCOSE 120* 92  BUN 8 7  CREATININE 0.62 0.65  CALCIUM 7.6* 7.8*     Studies/Results: Ct Abdomen Pelvis W Contrast  Result Date: 12/01/2016 CLINICAL DATA:  Abdominal pain and low gray fever. Whipple last Thursday. EXAM: CT ABDOMEN AND PELVIS WITH CONTRAST TECHNIQUE: Multidetector CT imaging of the abdomen and pelvis was performed using the standard protocol following bolus administration of intravenous contrast. CONTRAST:  126mL ISOVUE-300 IOPAMIDOL (ISOVUE-300) INJECTION 61% COMPARISON:  01/14/2016 FINDINGS: Lower chest: Small nearly moderate pleural effusion on the left that is layering. Multi  segment atelectasis, especially in the left lower lobe. Hepatobiliary: No focal liver abnormality.There is a feeding tube within the biliary system traversing the choledochal jejunostomy. No ductal dilatation. Pancreas: Pancreatic head mass resection. There is a catheter in the proximal small bowel which is presumably the pancreatic duct stent that has migrated. There is edema around the pancreas without organized collection, expected after surgery. Narrowing of the SMV in the operative region without thrombosis or occlusion. Drains in place without neighboring fluid collection. Spleen: Unremarkable. Adrenals/Urinary Tract: Negative adrenals. Partial nephrectomies in the left upper pole and right lower pole. Unremarkable bladder. Stomach/Bowel: Postoperative bowel. Oral contrast extends into the biliary limb without extravasation. No ileus or obstruction. A jejunostomy is in place. Vascular/Lymphatic: Postoperative area narrowing of the SMV without thrombosis or occlusion. Negative arterial structures. No mass or adenopathy. Reproductive:Hysterectomy. Other: Small volume ascites mainly in the pelvis, non loculated. Anasarca Musculoskeletal: No acute abnormalities. IMPRESSION: 1. Status post Whipple without drainable collection. Small ascites, non loculated in the pelvis. 2. Oral contrast refluxed into the biliary limb with no extravasation. 3. The biliary stent is in good position. No duct dilatation. The pancreatic stent has migrated into the duodenum. 4. Multi segment atelectasis and small borderline moderate left pleural effusion. 5. Moderate SMV narrowing in the operative region, likely exacerbated by postop edema. 6. Anasarca. 7. Jejunostomy in good position. Electronically Signed   By: Monte Fantasia M.D.   On: 12/01/2016 13:39    Anti-infectives: Anti-infectives    Start     Dose/Rate Route Frequency Ordered Stop   12/01/16 0900  vancomycin (VANCOCIN) 1,250 mg in sodium chloride 0.9 % 250 mL  IVPB      1,250 mg 166.7 mL/hr over 90 Minutes Intravenous Every 8 hours 12/01/16 0821     11/29/16 1600  vancomycin (VANCOCIN) IVPB 1000 mg/200 mL premix  Status:  Discontinued     1,000 mg 200 mL/hr over 60 Minutes Intravenous Every 8 hours 11/29/16 0656 12/01/16 0821   11/29/16 0700  vancomycin (VANCOCIN) 2,000 mg in sodium chloride 0.9 % 500 mL IVPB     2,000 mg 250 mL/hr over 120 Minutes Intravenous  Once 11/29/16 0656 11/29/16 1015   11/28/16 1200  fluconazole (DIFLUCAN) IVPB 400 mg     400 mg 100 mL/hr over 120 Minutes Intravenous Every 24 hours 11/27/16 1115     11/27/16 1200  piperacillin-tazobactam (ZOSYN) IVPB 3.375 g     3.375 g 12.5 mL/hr over 240 Minutes Intravenous Every 8 hours 11/27/16 1109     11/27/16 1200  fluconazole (DIFLUCAN) IVPB 800 mg     800 mg 100 mL/hr over 240 Minutes Intravenous  Once 11/27/16 1114 11/27/16 1616   11/23/16 2000  ceFAZolin (ANCEF) IVPB 2g/100 mL premix     2 g 200 mL/hr over 30 Minutes Intravenous Every 8 hours 11/23/16 1520 11/23/16 2228   11/23/16 0600  ceFAZolin (ANCEF) IVPB 2g/100 mL premix     2 g 200 mL/hr over 30 Minutes Intravenous To ShortStay Surgical 11/22/16 0814 11/23/16 1205      Assessment/Plan: s/p Procedure(s): WHIPPLE PROCEDURE Likely has leak at pancreatic and/or biliary anastomosis based on drainage. CT shows no drainable collection. Plan in place to aspirate pelvic fluid for evaluation. Patient unable to tolerate tube feedings or oral intake. She will need nutritional support. I have asked for a PICC line and TNA. Continue antibiotics.   LOS: 9 days    Johnnette Laux T 12/02/2016

## 2016-12-02 NOTE — Progress Notes (Signed)
Peripherally Inserted Central Catheter/Midline Placement  The IV Nurse has discussed with the patient and/or persons authorized to consent for the patient, the purpose of this procedure and the potential benefits and risks involved with this procedure.  The benefits include less needle sticks, lab draws from the catheter, and the patient may be discharged home with the catheter. Risks include, but not limited to, infection, bleeding, blood clot (thrombus formation), and puncture of an artery; nerve damage and irregular heartbeat and possibility to perform a PICC exchange if needed/ordered by physician.  Alternatives to this procedure were also discussed.  Bard Power PICC patient education guide, fact sheet on infection prevention and patient information card has been provided to patient /or left at bedside.    PICC/Midline Placement Documentation  PICC Double Lumen 17/00/17 PICC Left Basilic 47 cm 2 cm (Active)  Indication for Insertion or Continuance of Line Administration of hyperosmolar/irritating solutions (i.e. TPN, Vancomycin, etc.) 12/02/2016  3:32 PM  Exposed Catheter (cm) 2 cm 12/02/2016  3:32 PM  Site Assessment Clean;Dry;Intact 12/02/2016  3:32 PM  Lumen #1 Status Flushed;Saline locked;Blood return noted 12/02/2016  3:32 PM  Lumen #2 Status Flushed;Saline locked;Blood return noted 12/02/2016  3:32 PM  Dressing Type Transparent;Securing device 12/02/2016  3:32 PM  Dressing Status Clean;Dry;Intact;Antimicrobial disc in place 12/02/2016  3:32 PM  Dressing Change Due 12/09/16 12/02/2016  3:32 PM       Frances Maywood 12/02/2016, 3:34 PM

## 2016-12-02 NOTE — Progress Notes (Signed)
PHARMACY - ADULT TOTAL PARENTERAL NUTRITION CONSULT NOTE   Pharmacy Consult for TPN Indication: Intolerance to enteral feeding  Patient Measurements: Height: 5' 7" (170.2 cm) Weight: 248 lb 14.4 oz (112.9 kg) IBW/kg (Calculated) : 61.6 TPN AdjBW (KG): 74.4 Body mass index is 38.98 kg/m.  Usual weight: 220 lb per patient report   Assessment: 46 year old female admitted on 11/23/16 with cystic mass on pancrease now 8 days status post whipple procedure with possible pancreatic and/or biliary anastomotic leak due to drainage. Patient was started on clear liquid diet on 11/26/16 and Pivot 1.5 tube feeds. Tube feeds were increased to 20 ml/hr on 11/27/16 but abdomen has become increasingly tight and patient was bloated and uncomfortable. Tube feeds were stopped. Patient had been taking 25-50% of clear liquids  GI: Tube feeds turned off due to bloating/discomfort with abdominal tightness. Concern for anastomotic leak. RUQ drains output ~1L - 1 drain with purulent drainage, the other with bloody/bilious drainage. Clear liquid diet ordered but per Dr. Hoxworth, only to keep mouth moist. PPI IV.  Endo: No history of DM. AM glucose wnl.  Insulin requirements in the past 24 hours: 0 Lytes: Na/Cl low at 130/97. K good at 4.2. Phos is 3.4.  Mg is 2.1 Renal: Hx renal cancer and partial nephrectomy. SCr 0.65 (stable) with est CrCl ~ 115 ml/min. BUN 7. UOP not recorded. +18L since admission.  Pulm: 4L [place] Cards: BP and HR have been elevated but improved this AM. PRN Hydralazine.  Hepatobil:Elevated Alk Phos of 185. Tbili is elevated at 3- patient does not appear jaundice.  Neuro: Dilaudid PCA for pain. Pain score 3-5. Celexa. ID: Zosyn, Vancomycin, and Fluconazole for suspected intra-abdominal infection day #5. WBC 38.3 (trending down). Afebrile. No cultures available. Vancomycin trough was therapeutic this AM.  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   This provides 48 g of protein and 1162 kCals per day  Daily MVI in TPN Add Trace elements in TPN today Monitor Tbili and for signs and symptoms of jaundice.  Add sensitive SSI q6h and adjust as needed Monitor TPN labs  Jessica Millen, PharmD, BCPS Clinical Pharmacist Clinical phone 12/02/2016 until 3:30PM - [phone] After hours, please call #[phone] 12/02/2016,10:03 AM 

## 2016-12-02 NOTE — Progress Notes (Signed)
Pharmacy Antibiotic Note  Krystal Reynolds is a 46 y.o. 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  Pharmacy has been consulted for vancomycin, Zosyn, Diflucan dosing- antibiotic day # 5.  Vancomycin trough this AM is therapeutic at 20 (drawn 1hr early) after increase 1250mg  IV evey 8 hours. WBC is trending down. SCr is stable. Patient remains afebrile.  No cultures available.  Pathology negative for malignancy.  Plan: Continue Vancomycin 1250mg  IV every 8 hours for goal trough 15-20 mcg/mL. Weekly vancomycin trough if to continue therapy Continue Zosyn 3.375g IV every 8 hours- each dose infused over 4 hours.  Continue Fluconazole 400mg  IV every 24 hours.  Follow-up plan for length of therapy.   Height: 5\' 7"  (170.2 cm) Weight: 248 lb 14.4 oz (112.9 kg) IBW/kg (Calculated) : 61.6  Temp (24hrs), Avg:98 F (36.7 C), Min:97.7 F (36.5 C), Max:98.7 F (37.1 C)   Recent Labs Lab 11/26/16 0242 11/27/16 0210 11/28/16 0153 11/29/16 0235 11/30/16 0534 12/01/16 0654 12/02/16 0803  WBC 33.8* 29.5* 28.8* 38.5*  --  45.7* 38.3*  CREATININE 0.88  --   --  0.73 0.71 0.62 0.65  VANCOTROUGH  --   --   --   --   --  13* 20    Estimated Creatinine Clearance: 115.1 mL/min (by C-G formula based on SCr of 0.65 mg/dL).    No Known Allergies  Antimicrobials this admission: Zosyn 7/30 >>  Fluconazole 7/30 >>  Vanc 8/1 >>    Thank you for allowing pharmacy to be a part of this patient's care.  Sloan Leiter, PharmD, BCPS Clinical Pharmacist Clinical phone 12/02/2016 until 3:30 PM - [phone] After hours, please call #[phone]  12/02/2016 9:37 AM

## 2016-12-03 LAB — COMPREHENSIVE METABOLIC PANEL
ALT: 39 U/L (ref 14–54)
AST: 45 U/L — AB (ref 15–41)
Albumin: 1.7 g/dL — ABNORMAL LOW (ref 3.5–5.0)
Alkaline Phosphatase: 331 U/L — ABNORMAL HIGH (ref 38–126)
Anion gap: 7 (ref 5–15)
BUN: 7 mg/dL (ref 6–20)
CHLORIDE: 94 mmol/L — AB (ref 101–111)
CO2: 29 mmol/L (ref 22–32)
CREATININE: 0.62 mg/dL (ref 0.44–1.00)
Calcium: 7.6 mg/dL — ABNORMAL LOW (ref 8.9–10.3)
GFR calc non Af Amer: >60 mL/min (ref >60)
Glucose, Bld: 130 mg/dL — ABNORMAL HIGH (ref 65–99)
Potassium: 3.8 mmol/L (ref 3.5–5.1)
SODIUM: 130 mmol/L — AB (ref 135–145)
Total Bilirubin: 1.7 mg/dL — ABNORMAL HIGH (ref 0.3–1.2)
Total Protein: 5.2 g/dL — ABNORMAL LOW (ref 6.5–8.1)

## 2016-12-03 LAB — GLUCOSE, CAPILLARY
GLUCOSE-CAPILLARY: 131 mg/dL — AB (ref 65–99)
GLUCOSE-CAPILLARY: 123 mg/dL — AB (ref 65–99)
Glucose-Capillary: 123 mg/dL — ABNORMAL HIGH (ref 65–99)
Glucose-Capillary: 126 mg/dL — ABNORMAL HIGH (ref 65–99)

## 2016-12-03 LAB — CBC
HCT: 23.3 % — ABNORMAL LOW (ref 36.0–46.0)
Hemoglobin: 7.6 g/dL — ABNORMAL LOW (ref 12.0–15.0)
MCH: 28.7 pg (ref 26.0–34.0)
MCHC: 32.6 g/dL (ref 30.0–36.0)
MCV: 87.9 fL (ref 78.0–100.0)
PLATELETS: 472 10*3/uL — AB (ref 150–400)
RBC: 2.65 MIL/uL — AB (ref 3.87–5.11)
RDW: 15.3 % (ref 11.5–15.5)
WBC: 35.7 10*3/uL — AB (ref 4.0–10.5)

## 2016-12-03 LAB — DIFFERENTIAL
BASOS ABS: 0.7 10*3/uL — AB (ref 0.0–0.1)
Basophils Relative: 2 %
EOS ABS: 0.4 10*3/uL (ref 0.0–0.7)
Eosinophils Relative: 1 %
LYMPHS PCT: 5 %
Lymphs Abs: 1.8 10*3/uL (ref 0.7–4.0)
Monocytes Absolute: 2.1 10*3/uL — ABNORMAL HIGH (ref 0.1–1.0)
Monocytes Relative: 6 %
NEUTROS ABS: 30.7 10*3/uL — AB (ref 1.7–7.7)
NEUTROS PCT: 86 %

## 2016-12-03 LAB — MAGNESIUM: MAGNESIUM: 2 mg/dL (ref 1.7–2.4)

## 2016-12-03 LAB — PHOSPHORUS: Phosphorus: 3.2 mg/dL (ref 2.5–4.6)

## 2016-12-03 LAB — PREALBUMIN: Prealbumin: 5.3 mg/dL — ABNORMAL LOW (ref 18–38)

## 2016-12-03 LAB — TRIGLYCERIDES: Triglycerides: 308 mg/dL — ABNORMAL HIGH (ref <150)

## 2016-12-03 MED ORDER — TRACE MINERALS CR-CU-MN-SE-ZN 10-1000-500-60 MCG/ML IV SOLN
INTRAVENOUS | Status: AC
  Administered 2016-12-03: 18:00:00 via INTRAVENOUS
  Filled 2016-12-03: qty 1560

## 2016-12-03 MED ORDER — POTASSIUM CHLORIDE IN NACL 20-0.9 MEQ/L-% IV SOLN
INTRAVENOUS | Status: DC
  Filled 2016-12-03: qty 1000

## 2016-12-03 MED ORDER — POTASSIUM CHLORIDE IN NACL 20-0.9 MEQ/L-% IV SOLN
INTRAVENOUS | Status: AC
  Administered 2016-12-03: 11:00:00 via INTRAVENOUS

## 2016-12-03 NOTE — Progress Notes (Signed)
Patient ID: Krystal Reynolds, female   DOB: April 20, 1971, 46 y.o.   MRN: [id number] 10 Days Post-Op   Subjective: Feels much better today. Abdominal tightness and discomfort significantly improved.  Objective: Vital signs in last 24 hours: Temp:  [98.2 F (36.8 C)-98.7 F (37.1 C)] 98.2 F (36.8 C) (08/05 0404) Pulse Rate:  [76-88] 76 (08/05 0404) Resp:  [15-26] 19 (08/05 0816) BP: (143-145)/(66-77) 144/77 (08/05 0404) SpO2:  [92 %-98 %] 94 % (08/05 0816) Last BM Date: 11/30/16  Intake/Output from previous day: 08/04 0701 - 08/05 0700 In: 2960.7 [I.V.:1860.7; IV Piggyback:1100] Out: 2855 [Urine:2300; Drains:555] Intake/Output this shift: No intake/output data recorded.  General appearance: alert, cooperative and no distress GI: Obese with possibly some distention. Mild diffuse tenderness without peritoneal signs. Right upper quadrant drains, one with lower volume purulent and the other with higher volume cloudy bilious drainage Incision/Wound: No erythema or drainage, staples intact  Lab Results:   Recent Labs  12/02/16 0803 12/03/16 0411  WBC 38.3* 35.7*  HGB 7.8* 7.6*  HCT 23.6* 23.3*  PLT 398 472*   BMET  Recent Labs  12/02/16 0803 12/03/16 0411  NA 130* 130*  K 4.2 3.8  CL 97* 94*  CO2 27 29  GLUCOSE 92 130*  BUN 7 7  CREATININE 0.65 0.62  CALCIUM 7.8* 7.6*     Studies/Results: Ct Abdomen Pelvis W Contrast  Result Date: 12/01/2016 CLINICAL DATA:  Abdominal pain and low gray fever. Whipple last Thursday. EXAM: CT ABDOMEN AND PELVIS WITH CONTRAST TECHNIQUE: Multidetector CT imaging of the abdomen and pelvis was performed using the standard protocol following bolus administration of intravenous contrast. CONTRAST:  165mL ISOVUE-300 IOPAMIDOL (ISOVUE-300) INJECTION 61% COMPARISON:  01/14/2016 FINDINGS: Lower chest: Small nearly moderate pleural effusion on the left that is layering. Multi segment atelectasis, especially in the left lower lobe. Hepatobiliary: No  focal liver abnormality.There is a feeding tube within the biliary system traversing the choledochal jejunostomy. No ductal dilatation. Pancreas: Pancreatic head mass resection. There is a catheter in the proximal small bowel which is presumably the pancreatic duct stent that has migrated. There is edema around the pancreas without organized collection, expected after surgery. Narrowing of the SMV in the operative region without thrombosis or occlusion. Drains in place without neighboring fluid collection. Spleen: Unremarkable. Adrenals/Urinary Tract: Negative adrenals. Partial nephrectomies in the left upper pole and right lower pole. Unremarkable bladder. Stomach/Bowel: Postoperative bowel. Oral contrast extends into the biliary limb without extravasation. No ileus or obstruction. A jejunostomy is in place. Vascular/Lymphatic: Postoperative area narrowing of the SMV without thrombosis or occlusion. Negative arterial structures. No mass or adenopathy. Reproductive:Hysterectomy. Other: Small volume ascites mainly in the pelvis, non loculated. Anasarca Musculoskeletal: No acute abnormalities. IMPRESSION: 1. Status post Whipple without drainable collection. Small ascites, non loculated in the pelvis. 2. Oral contrast refluxed into the biliary limb with no extravasation. 3. The biliary stent is in good position. No duct dilatation. The pancreatic stent has migrated into the duodenum. 4. Multi segment atelectasis and small borderline moderate left pleural effusion. 5. Moderate SMV narrowing in the operative region, likely exacerbated by postop edema. 6. Anasarca. 7. Jejunostomy in good position. Electronically Signed   By: Monte Fantasia M.D.   On: 12/01/2016 13:39   US Abdomen Limited  Result Date: 12/02/2016 CLINICAL DATA:  Evaluate for ascites and possible paracentesis. EXAM: LIMITED ABDOMEN ULTRASOUND FOR ASCITES TECHNIQUE: Limited ultrasound survey for ascites was performed in all four abdominal quadrants.  COMPARISON:  CT 12/01/2016 FINDINGS: There  is minimal ascites in the right lower quadrant and similar to the recent CT. 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 No significant fluid around the liver. IMPRESSION: Minimal ascites, most in the right lower quadrant. Not enough fluid for a paracentesis. Electronically Signed   By: Markus Daft M.D.   On: 12/02/2016 15:23    Anti-infectives: Anti-infectives    Start     Dose/Rate Route Frequency Ordered Stop   12/01/16 0900  vancomycin (VANCOCIN) 1,250 mg in sodium chloride 0.9 % 250 mL IVPB     1,250 mg 166.7 mL/hr over 90 Minutes Intravenous Every 8 hours 12/01/16 0821     11/29/16 1600  vancomycin (VANCOCIN) IVPB 1000 mg/200 mL premix  Status:  Discontinued     1,000 mg 200 mL/hr over 60 Minutes Intravenous Every 8 hours 11/29/16 0656 12/01/16 0821   11/29/16 0700  vancomycin (VANCOCIN) 2,000 mg in sodium chloride 0.9 % 500 mL IVPB     2,000 mg 250 mL/hr over 120 Minutes Intravenous  Once 11/29/16 0656 11/29/16 1015   11/28/16 1200  fluconazole (DIFLUCAN) IVPB 400 mg     400 mg 100 mL/hr over 120 Minutes Intravenous Every 24 hours 11/27/16 1115     11/27/16 1200  piperacillin-tazobactam (ZOSYN) IVPB 3.375 g     3.375 g 12.5 mL/hr over 240 Minutes Intravenous Every 8 hours 11/27/16 1109     11/27/16 1200  fluconazole (DIFLUCAN) IVPB 800 mg     800 mg 100 mL/hr over 240 Minutes Intravenous  Once 11/27/16 1114 11/27/16 1616   11/23/16 2000  ceFAZolin (ANCEF) IVPB 2g/100 mL premix     2 g 200 mL/hr over 30 Minutes Intravenous Every 8 hours 11/23/16 1520 11/23/16 2228   11/23/16 0600  ceFAZolin (ANCEF) IVPB 2g/100 mL premix     2 g 200 mL/hr over 30 Minutes Intravenous To ShortStay Surgical 11/22/16 0814 11/23/16 1205      Assessment/Plan: s/p Procedure(s): WHIPPLE PROCEDURE Apparent leak at pancreatic and/or biliary anastomosis. Appears much improved today in terms of pain and exam. She is happy with how much better she feels. WBC  trending down. Was not able to tolerate tube feedings and now TNA started. Continue antibiotics. No drainable fluid collections noted on recent CT.   LOS: 10 days    Daiden Coltrane T 12/03/2016

## 2016-12-03 NOTE — Progress Notes (Signed)
PHARMACY - ADULT TOTAL PARENTERAL NUTRITION CONSULT NOTE   Pharmacy Consult for TPN Indication: Intolerance to enteral feeding  Patient Measurements: Height: '5\' 7"'$  (170.2 cm) Weight: 248 lb 14.4 oz (112.9 kg) IBW/kg (Calculated) : 61.6 TPN AdjBW (KG): 74.4 Body mass index is 38.98 kg/m.  Usual weight: 220 lb per patient report   Assessment: 46 year old female admitted on 11/23/16 with cystic mass on pancrease now 8 days status post whipple procedure and placement of biliary and pancreatic duct stents with possible pancreatic and/or biliary anastomotic leak due to drainage. Patient was started on clear liquid diet on 11/26/16 and Pivot 1.5 tube feeds. Tube feeds were increased to 20 ml/hr on 11/27/16 but abdomen has become increasingly tight and patient was bloated and uncomfortable. Tube feeds were stopped. Patient had been taking 25-50% of clear liquids  GI: Tube feeds turned off due to bloating/discomfort with abdominal tightness. Concern for anastomotic leak. RUQ drains output ~1L - 1 drain with purulent drainage, the other with bloody/bilious drainage. Clear liquid diet ordered but per Dr. Excell Seltzer, only to keep mouth moist. PPI IV. Albumin 1.7, Prealbumin 5.3. LBM 8/2. Patient edematous. Today patient states feels much better. Endo: No history of DM. CBGs 101-130 since TPN initiated.  Insulin requirements in the past 24 hours: 1 unit Lytes: Na/Cl low at 130/94. K 3.8. Phos is 3.2.  Mg is 2. CoCa 9.4. Renal: Hx renal cancer and partial nephrectomy. SCr 0.62 (stable) with est CrCl ~ 115 ml/min. BUN 7. UOP not recorded. +18L since admission.  Pulm: 4L [place] Cards: BP and HR have been elevated but improved this AM. PRN Hydralazine.  Hepatobil: LFTs trending up (Alk Phos 331, AST 45). Tbili trending down at 1.7. Baseline TG 308.  Neuro: Dilaudid PCA for pain. Pain score 3-5. Celexa. ID: Zosyn, Vancomycin, and Fluconazole for suspected intra-abdominal infection day #6. WBC 35.7 (slowly trending  down). Afebrile. No cultures available. Vancomycin trough was therapeutic this AM.  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   Plan:  Increase Clinimix E 5/15 at 57m/hr 20% lipid emulsion on MWF - no lipids today. Decrease NS + 20 [facility] at 1800 PM.   This provides 78 g of protein and 1107 kCals per day meeting 71% of protein and 62% kcal needs. Daily MVI in TPN Add Trace elements in TPN today Monitor Tbili and for signs and symptoms of jaundice.  Continue sensitive SSI q6h and adjust as needed Monitor TPN labs Watch TG closely - may need to hold lipids Monitor liver function  JSloan Leiter PharmD, BCPS Clinical Pharmacist Clinical phone 12/03/2016 until 3:30PM - #[phone] hours, please call #[phone]/08/2016,7:44 AM

## 2016-12-04 ENCOUNTER — Inpatient Hospital Stay (HOSPITAL_COMMUNITY): Payer: BC Managed Care – PPO

## 2016-12-04 LAB — TRIGLYCERIDES: TRIGLYCERIDES: 211 mg/dL — AB (ref <150)

## 2016-12-04 LAB — COMPREHENSIVE METABOLIC PANEL
ALT: 35 U/L (ref 14–54)
ANION GAP: 6 (ref 5–15)
AST: 41 U/L (ref 15–41)
Albumin: 1.8 g/dL — ABNORMAL LOW (ref 3.5–5.0)
Alkaline Phosphatase: 401 U/L — ABNORMAL HIGH (ref 38–126)
BILIRUBIN TOTAL: 1.3 mg/dL — AB (ref 0.3–1.2)
BUN: 5 mg/dL — ABNORMAL LOW (ref 6–20)
CO2: 30 mmol/L (ref 22–32)
Calcium: 7.7 mg/dL — ABNORMAL LOW (ref 8.9–10.3)
Chloride: 98 mmol/L — ABNORMAL LOW (ref 101–111)
Creatinine, Ser: 0.57 mg/dL (ref 0.44–1.00)
Glucose, Bld: 125 mg/dL — ABNORMAL HIGH (ref 65–99)
POTASSIUM: 3.7 mmol/L (ref 3.5–5.1)
Sodium: 134 mmol/L — ABNORMAL LOW (ref 135–145)
TOTAL PROTEIN: 5.4 g/dL — AB (ref 6.5–8.1)

## 2016-12-04 LAB — DIFFERENTIAL
BASOS ABS: 0.6 10*3/uL — AB (ref 0.0–0.1)
Basophils Relative: 2 %
EOS ABS: 0.3 10*3/uL (ref 0.0–0.7)
Eosinophils Relative: 1 %
LYMPHS ABS: 2.1 10*3/uL (ref 0.7–4.0)
Lymphocytes Relative: 7 %
MONO ABS: 2.1 10*3/uL — AB (ref 0.1–1.0)
MONOS PCT: 7 %
NEUTROS ABS: 24.8 10*3/uL — AB (ref 1.7–7.7)
NEUTROS PCT: 83 %

## 2016-12-04 LAB — PHOSPHORUS: PHOSPHORUS: 3.5 mg/dL (ref 2.5–4.6)

## 2016-12-04 LAB — CBC
HEMATOCRIT: 23.2 % — AB (ref 36.0–46.0)
Hemoglobin: 7.5 g/dL — ABNORMAL LOW (ref 12.0–15.0)
MCH: 28.7 pg (ref 26.0–34.0)
MCHC: 32.3 g/dL (ref 30.0–36.0)
MCV: 88.9 fL (ref 78.0–100.0)
Platelets: 493 10*3/uL — ABNORMAL HIGH (ref 150–400)
RBC: 2.61 MIL/uL — ABNORMAL LOW (ref 3.87–5.11)
RDW: 15.6 % — AB (ref 11.5–15.5)
WBC: 29.9 10*3/uL — ABNORMAL HIGH (ref 4.0–10.5)

## 2016-12-04 LAB — PREALBUMIN: Prealbumin: 6.2 mg/dL — ABNORMAL LOW (ref 18–38)

## 2016-12-04 LAB — GLUCOSE, CAPILLARY
Glucose-Capillary: 155 mg/dL — ABNORMAL HIGH (ref 65–99)
Glucose-Capillary: 127 mg/dL — ABNORMAL HIGH (ref 65–99)
Glucose-Capillary: 129 mg/dL — ABNORMAL HIGH (ref 65–99)

## 2016-12-04 LAB — MAGNESIUM: MAGNESIUM: 2.1 mg/dL (ref 1.7–2.4)

## 2016-12-04 MED ORDER — TRACE MINERALS CR-CU-MN-SE-ZN 10-1000-500-60 MCG/ML IV SOLN
INTRAVENOUS | Status: AC
  Administered 2016-12-04: 18:00:00 via INTRAVENOUS
  Filled 2016-12-04: qty 1992

## 2016-12-04 MED ORDER — FAT EMULSION 20 % IV EMUL
240.0000 mL | INTRAVENOUS | Status: AC
  Administered 2016-12-04: 240 mL via INTRAVENOUS
  Filled 2016-12-04: qty 250

## 2016-12-04 MED ORDER — FAT EMULSION 20 % IV EMUL: 240.0000 mL | INTRAVENOUS | Status: DC

## 2016-12-04 MED ORDER — TRACE MINERALS CR-CU-MN-SE-ZN 10-1000-500-60 MCG/ML IV SOLN: INTRAVENOUS | Status: DC

## 2016-12-04 NOTE — Progress Notes (Signed)
Pt NPO, on TPN and constipated but she passed gas, informed on call MD, endorsed to am shift.

## 2016-12-04 NOTE — Progress Notes (Addendum)
PHARMACY - ADULT TOTAL PARENTERAL NUTRITION CONSULT NOTE   Pharmacy Consult for TPN Indication: Intolerance to enteral feeding  Patient Measurements: Height: '5\' 7"'$  (170.2 cm) Weight: 248 lb 14.4 oz (112.9 kg) IBW/kg (Calculated) : 61.6 TPN AdjBW (KG): 74.4 Body mass index is 38.98 kg/m.  Usual weight: 220 lb per patient report   Assessment: 46 year old female admitted on 11/23/16 with cystic mass on pancreas now 8 days status post whipple procedure and placement of biliary and pancreatic duct stents with possible pancreatic and/or biliary anastomotic leak due to drainage. Patient was started on clear liquid diet on 11/26/16 and Pivot 1.5 tube feeds. Tube feeds were increased to 20 ml/hr on 11/27/16 but abdomen has become increasingly tight and patient was bloated and uncomfortable. Tube feeds were stopped. Patient had been taking 25-50% of clear liquids  GI: Tube feeds turned off due to bloating/discomfort with abdominal tightness. Concern for anastomotic leak. RUQ drains output ~1L - 1 drain with purulent drainage, the other with bloody/bilious drainage. CL diet, but only to keep mouth moist.  PPI IV.  Endo: No history of DM. CBGs controlled.  Insulin requirements in the past 24 hours: 5 unit Lytes: Na/Cl low but improved. K 3.7. Phos is 3.5.  Mg is 2.1. CoCa 9.4. Renal: Hx renal cancer and partial nephrectomy. SCr 0.62 (stable) with est CrCl ~ 115 ml/min. BUN 7. UOP 25m/kg/hr. +15L since admission.  Pulm: 4L [place].  CXR 8/6- inc B-pleural effusions, atelectasis Cards: BP high,  HR wnl.  PRN Hydralazine.  Hepatobil: LFTs trending up (Alk Phos 331, AST 45). Tbili trending down at 1.7. Baseline TG 308 -> 211 this AM.  PAlb 6.2- slightly improved.  Neuro: Dilaudid PCA for pain. Pain score 0-2. Celexa, robaxin iv prn. ID: Zosyn, Vancomycin, and Fluconazole for suspected intra-abdominal infection day #6. WBC 29.9 (slowly trending down). Afebrile. No cultures available. Vancomycin trough was  therapeutic this AM.  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   Plan:  Increase Clinimix E 5/15 to 824mhr 20% lipid emulsion on MWF - due 8/6. This provides 100g of protein and 1900 kCals per day meeting 90% of protein and 100% kcal needs. Daily MVI and Trace Elements in TPN Monitor Tbili and for signs and symptoms of jaundice.  Continue sensitive SSI q6h and adjust as needed Monitor TPN labs Watch TG closely - may need to hold lipids Monitor liver function   KeLewie Chamber PharmD Clinical Pharmacist CoSalton City [phone] 3p, then [phone]

## 2016-12-04 NOTE — Progress Notes (Signed)
Patient ID: Krystal Reynolds, female   DOB: Apr 29, 1971, 46 y.o.   MRN: [id number] 11 Days Post-Op   Subjective: Continues to feel much better.    Objective: Vital signs in last 24 hours: Temp:  [97.5 F (36.4 C)-98.3 F (36.8 C)] 97.5 F (36.4 C) (08/06 1359) Pulse Rate:  [75-83] 77 (08/06 1359) Resp:  [17-32] 30 (08/06 1655) BP: (150-162)/(57-75) 150/72 (08/06 1359) SpO2:  [93 %-97 %] 95 % (08/06 1655) Last BM Date: 12/04/16  Intake/Output from previous day: 08/05 0701 - 08/06 0700 In: 2809 [P.O.:980; I.V.:729; IV Piggyback:1100] Out: 9163 [Urine:5350; Drains:735] Intake/Output this shift: Total I/O In: 500 [P.O.:500] Out: 500 [Urine:300; Drains:200]  General appearance: alert, cooperative and no distress GI: Obese with minimal distention.  Much better than Friday. Right upper quadrant drains, one with lower volume purulent and the other with higher volume cloudy bilious drainage Incision/Wound: No erythema or drainage, staples intact  Lab Results:   Recent Labs  12/03/16 0411 12/04/16 0432  WBC 35.7* 29.9*  HGB 7.6* 7.5*  HCT 23.3* 23.2*  PLT 472* 493*   BMET  Recent Labs  12/03/16 0411 12/04/16 0432  NA 130* 134*  K 3.8 3.7  CL 94* 98*  CO2 29 30  GLUCOSE 130* 125*  BUN 7 5*  CREATININE 0.62 0.57  CALCIUM 7.6* 7.7*     Studies/Results: Dg Chest Port 1 View  Result Date: 12/04/2016 CLINICAL DATA:  Short of breath EXAM: PORTABLE CHEST 1 VIEW COMPARISON:  11/28/2016 FINDINGS: LEFT PICC line with tip in distal SVC. Stable cardiac silhouette. There bilateral pleural effusions and basilar atelectasis. No upper lobe edema. IMPRESSION: 1. New PICC line with tip in distal SVC 2. Increasing bilateral pleural effusions and basilar atelectasis. Electronically Signed   By: Suzy Bouchard M.D.   On: 12/04/2016 10:33    Anti-infectives: Anti-infectives    Start     Dose/Rate Route Frequency Ordered Stop   12/01/16 0900  vancomycin (VANCOCIN) 1,250 mg in sodium  chloride 0.9 % 250 mL IVPB     1,250 mg 166.7 mL/hr over 90 Minutes Intravenous Every 8 hours 12/01/16 0821     11/29/16 1600  vancomycin (VANCOCIN) IVPB 1000 mg/200 mL premix  Status:  Discontinued     1,000 mg 200 mL/hr over 60 Minutes Intravenous Every 8 hours 11/29/16 0656 12/01/16 0821   11/29/16 0700  vancomycin (VANCOCIN) 2,000 mg in sodium chloride 0.9 % 500 mL IVPB     2,000 mg 250 mL/hr over 120 Minutes Intravenous  Once 11/29/16 0656 11/29/16 1015   11/28/16 1200  fluconazole (DIFLUCAN) IVPB 400 mg     400 mg 100 mL/hr over 120 Minutes Intravenous Every 24 hours 11/27/16 1115     11/27/16 1200  piperacillin-tazobactam (ZOSYN) IVPB 3.375 g     3.375 g 12.5 mL/hr over 240 Minutes Intravenous Every 8 hours 11/27/16 1109     11/27/16 1200  fluconazole (DIFLUCAN) IVPB 800 mg     800 mg 100 mL/hr over 240 Minutes Intravenous  Once 11/27/16 1114 11/27/16 1616   11/23/16 2000  ceFAZolin (ANCEF) IVPB 2g/100 mL premix     2 g 200 mL/hr over 30 Minutes Intravenous Every 8 hours 11/23/16 1520 11/23/16 2228   11/23/16 0600  ceFAZolin (ANCEF) IVPB 2g/100 mL premix     2 g 200 mL/hr over 30 Minutes Intravenous To ShortStay Surgical 11/22/16 0814 11/23/16 1205      Assessment/Plan: s/p Procedure(s): WHIPPLE PROCEDURE Apparent leak at pancreatic and/or biliary anastomosis.  Appears much improved today in terms of pain and exam. She is happy with how much better she feels.  TNA.  If continues to feel better tomorrow, will advance diet and restart trickle feeds.  Still needing PCA.     LOS: 11 days    Loucile Posner 12/04/2016

## 2016-12-05 LAB — GLUCOSE, CAPILLARY
GLUCOSE-CAPILLARY: 134 mg/dL — AB (ref 65–99)
Glucose-Capillary: 129 mg/dL — ABNORMAL HIGH (ref 65–99)
Glucose-Capillary: 139 mg/dL — ABNORMAL HIGH (ref 65–99)
Glucose-Capillary: 107 mg/dL — ABNORMAL HIGH (ref 65–99)

## 2016-12-05 LAB — VANCOMYCIN, TROUGH: Vancomycin Tr: 18 ug/mL (ref 15–20)

## 2016-12-05 LAB — BASIC METABOLIC PANEL
ANION GAP: 6 (ref 5–15)
BUN: 6 mg/dL (ref 6–20)
CALCIUM: 7.8 mg/dL — AB (ref 8.9–10.3)
CHLORIDE: 99 mmol/L — AB (ref 101–111)
CO2: 29 mmol/L (ref 22–32)
Creatinine, Ser: 0.61 mg/dL (ref 0.44–1.00)
GFR calc non Af Amer: >60 mL/min (ref >60)
GLUCOSE: 122 mg/dL — AB (ref 65–99)
Potassium: 3.8 mmol/L (ref 3.5–5.1)
Sodium: 134 mmol/L — ABNORMAL LOW (ref 135–145)

## 2016-12-05 LAB — CBC
HEMATOCRIT: 23.9 % — AB (ref 36.0–46.0)
HEMOGLOBIN: 7.6 g/dL — AB (ref 12.0–15.0)
MCH: 28.6 pg (ref 26.0–34.0)
MCHC: 31.8 g/dL (ref 30.0–36.0)
MCV: 89.8 fL (ref 78.0–100.0)
Platelets: 537 10*3/uL — ABNORMAL HIGH (ref 150–400)
RBC: 2.66 MIL/uL — ABNORMAL LOW (ref 3.87–5.11)
RDW: 16.1 % — AB (ref 11.5–15.5)
WBC: 27.9 10*3/uL — AB (ref 4.0–10.5)

## 2016-12-05 MED ORDER — TRACE MINERALS CR-CU-MN-SE-ZN 10-1000-500-60 MCG/ML IV SOLN
INTRAVENOUS | Status: AC
  Administered 2016-12-05: 17:00:00 via INTRAVENOUS
  Filled 2016-12-05: qty 1992

## 2016-12-05 MED ORDER — FAT EMULSION 20 % IV EMUL
240.0000 mL | INTRAVENOUS | Status: AC
  Administered 2016-12-05: 240 mL via INTRAVENOUS
  Filled 2016-12-05: qty 250

## 2016-12-05 MED ORDER — OXYCODONE HCL 5 MG/5ML PO SOLN
10.0000 mg | Freq: Three times a day (TID) | ORAL | Status: DC
  Administered 2016-12-05 – 2016-12-06 (×3): 10 mg via ORAL
  Filled 2016-12-05 (×3): qty 10

## 2016-12-05 NOTE — Progress Notes (Signed)
Patient ID: Krystal Reynolds, female   DOB: 1970-10-20, 46 y.o.   MRN: [id number] 12 Days Post-Op   Subjective: Continues to improve.  Passing gas.  Improved shortness of breath.  CXR showed worsening effusions.    Objective: Vital signs in last 24 hours: Temp:  [97.5 F (36.4 C)-98.5 F (36.9 C)] 97.8 F (36.6 C) (08/07 0926) Pulse Rate:  [71-83] 83 (08/07 0926) Resp:  [19-30] 22 (08/07 0926) BP: (144-155)/(67-80) 154/67 (08/07 0926) SpO2:  [93 %-100 %] 100 % (08/07 0926) Last BM Date: 12/04/16  Intake/Output from previous day: 08/06 0701 - 08/07 0700 In: 2869.5 [P.O.:500; I.V.:1269.5; IV Piggyback:1100] Out: 8099 [Urine:3600; Drains:460] Intake/Output this shift: Total I/O In: 200 [P.O.:200] Out: 300 [Urine:300]  General appearance: alert, cooperative and no distress GI: Obese with minimal distention.   Drains murky Incision/Wound:  No erythema or drainage, staples intact  Lab Results:   Recent Labs  12/04/16 0432 12/05/16 0408  WBC 29.9* 27.9*  HGB 7.5* 7.6*  HCT 23.2* 23.9*  PLT 493* 537*   BMET  Recent Labs  12/04/16 0432 12/05/16 0408  NA 134* 134*  K 3.7 3.8  CL 98* 99*  CO2 30 29  GLUCOSE 125* 122*  BUN 5* 6  CREATININE 0.57 0.61  CALCIUM 7.7* 7.8*     Studies/Results: Dg Chest Port 1 View  Result Date: 12/04/2016 CLINICAL DATA:  Short of breath EXAM: PORTABLE CHEST 1 VIEW COMPARISON:  11/28/2016 FINDINGS: LEFT PICC line with tip in distal SVC. Stable cardiac silhouette. There bilateral pleural effusions and basilar atelectasis. No upper lobe edema. IMPRESSION: 1. New PICC line with tip in distal SVC 2. Increasing bilateral pleural effusions and basilar atelectasis. Electronically Signed   By: Suzy Bouchard M.D.   On: 12/04/2016 10:33    Anti-infectives: Anti-infectives    Start     Dose/Rate Route Frequency Ordered Stop   12/01/16 0900  vancomycin (VANCOCIN) 1,250 mg in sodium chloride 0.9 % 250 mL IVPB     1,250 mg 166.7 mL/hr over 90  Minutes Intravenous Every 8 hours 12/01/16 0821     11/29/16 1600  vancomycin (VANCOCIN) IVPB 1000 mg/200 mL premix  Status:  Discontinued     1,000 mg 200 mL/hr over 60 Minutes Intravenous Every 8 hours 11/29/16 0656 12/01/16 0821   11/29/16 0700  vancomycin (VANCOCIN) 2,000 mg in sodium chloride 0.9 % 500 mL IVPB     2,000 mg 250 mL/hr over 120 Minutes Intravenous  Once 11/29/16 0656 11/29/16 1015   11/28/16 1200  fluconazole (DIFLUCAN) IVPB 400 mg     400 mg 100 mL/hr over 120 Minutes Intravenous Every 24 hours 11/27/16 1115     11/27/16 1200  piperacillin-tazobactam (ZOSYN) IVPB 3.375 g     3.375 g 12.5 mL/hr over 240 Minutes Intravenous Every 8 hours 11/27/16 1109     11/27/16 1200  fluconazole (DIFLUCAN) IVPB 800 mg     800 mg 100 mL/hr over 240 Minutes Intravenous  Once 11/27/16 1114 11/27/16 1616   11/23/16 2000  ceFAZolin (ANCEF) IVPB 2g/100 mL premix     2 g 200 mL/hr over 30 Minutes Intravenous Every 8 hours 11/23/16 1520 11/23/16 2228   11/23/16 0600  ceFAZolin (ANCEF) IVPB 2g/100 mL premix     2 g 200 mL/hr over 30 Minutes Intravenous To ShortStay Surgical 11/22/16 0814 11/23/16 1205      Assessment/Plan: s/p Procedure(s): WHIPPLE PROCEDURE Apparent leak at pancreatic and/or biliary anastomosis. Advance diet to full liquids.   Start  liquid oxycodone to see if this decreases need for PCA.   Continue antibiotics for now.  Still has significant amount of pus in drains.  Will attempt switch to oral antibiotics in next few days if she continues to tolerate diet.     LOS: 12 days    Kassius Battiste 12/05/2016

## 2016-12-05 NOTE — Progress Notes (Signed)
Pharmacy Antibiotic Note  Krystal Reynolds is a 46 y.o. female admitted on 11/23/2016 on Vanc/Zosyn/Fluc D#7 for suspected intra-abd infxn, s/p Whipple 7/26 - afebrile, WBC down 27.9, no culture.  CT with no drainable fluid collections.   Vancomycin trough = 18, therapeutic. Scr 0.61, stable.   Plan: Continue Vancomycin 1250mg  IV every 8 hours for goal trough 15-20 mcg/mL. Weekly vancomycin trough if to continue therapy Continue Zosyn 3.375g IV every 8 hours- each dose infused over 4 hours.  Continue Fluconazole 400mg  IV every 24 hours.  Follow-up plan for length of therapy.   Height: 5\' 7"  (170.2 cm) Weight: 248 lb 14.4 oz (112.9 kg) IBW/kg (Calculated) : 61.6  Temp (24hrs), Avg:98.1 F (36.7 C), Min:97.8 F (36.6 C), Max:98.5 F (36.9 C)   Recent Labs Lab 12/01/16 0654 12/02/16 0803 12/03/16 0411 12/04/16 0432 12/05/16 0408 12/05/16 1649  WBC 45.7* 38.3* 35.7* 29.9* 27.9*  --   CREATININE 0.62 0.65 0.62 0.57 0.61  --   VANCOTROUGH 13* 20  --   --   --  18    Estimated Creatinine Clearance: 115.1 mL/min (by C-G formula based on SCr of 0.61 mg/dL).    No Known Allergies  Antimicrobials this admission: Zosyn 7/30 >>  Fluconazole 7/30 >>  Vanc 8/1 >>   8/3 VT = 13 on 1g. IV q8h (drawn 1 hr early) >> increase to 1250mg  IV q8h. 8/4 VT = 20 on 1250mg  IV q8h (drawn 1 hr early) - no change 8/7 VT = 18 on 1250 mg IV Q 8 hrs. - no change   Thank you for allowing pharmacy to be a part of this patient's care.  Maryanna Shape, PharmD, BCPS  Clinical Pharmacist  Pager: [phone]   12/05/2016 6:01 PM

## 2016-12-05 NOTE — Progress Notes (Signed)
PHARMACY - ADULT TOTAL PARENTERAL NUTRITION CONSULT NOTE   Pharmacy Consult for TPN Indication: Intolerance to enteral feeding  Patient Measurements: Height: '5\' 7"'$  (170.2 cm) Weight: 248 lb 14.4 oz (112.9 kg) IBW/kg (Calculated) : 61.6 TPN AdjBW (KG): 74.4 Body mass index is 38.98 kg/m.  Usual weight: 220 lb per patient report   Assessment: 46 year old female admitted on 11/23/16 with cystic mass on pancreas now 8 days status post whipple procedure and placement of biliary and pancreatic duct stents with possible pancreatic and/or biliary anastomotic leak due to drainage. Patient was started on clear liquid diet on 11/26/16 and Pivot 1.5 tube feeds. Tube feeds were increased to 20 ml/hr on 11/27/16 but abdomen has become increasingly tight and patient was bloated and uncomfortable. Tube feeds were stopped. Patient had been taking 25-50% of clear liquids  GI: Tube feeds turned off due to bloating/discomfort with abdominal tightness. Concern for anastomotic leak. Pt with 2 RUQ drains - output/24h: ~470 cc. On full liquid diet. Considering further diet advancement and restart of trickle feeds later today. Will continue with TPN until tolerance of this is established. Pt +BM on 8/6. PPI IV.  Endo: No history of DM. CBGs controlled.  Insulin requirements in the past 24 hours: 5 unit Lytes: Na/Cl low but improved. K 3.8. Phos 3.5 (8/6).  Mg is 2.1 (8/6). CoCa 9.5 Renal: Hx renal cancer and partial nephrectomy. SCr 0.61 (stable) with est CrCl ~ 115 ml/min. BUN 7. UOP 1.3 ml/kg/hr.  Pulm: 4L [place].  CXR 8/6- inc B-pleural effusions, atelectasis Cards: BP high,  HR wnl.  PRN Hydralazine.  Hepatobil: LFTs trending up (Alk Phos 331, AST 45). Tbili trending down at 1.3. TG 211 (8/6).  PAlb 6.2- slightly improved.  Neuro: Dilaudid PCA for pain. Pain score 0-2. Celexa, robaxin iv prn. ID: Zosyn, Vancomycin, and Fluconazole for suspected intra-abdominal infection day #7. WBC 27.9 (slowly trending down).  Afebrile. No cultures available. .  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   Plan:  Continue Clinimix E 5/15 at 108m/hr Continue with 20% lipid emulsion at 20 cc/hr over 12 hours This provides 100g of protein and 1900 kCals per day meeting 90% of protein and 100% kcal needs. Daily MVI and Trace Elements in TPN Continue sensitive SSI q6h and adjust as needed Monitor TPN labs Monitor liver function  Thank you for allowing pharmacy to be a part of this patient's care.  EAlycia Rossetti PharmD, BCPS Clinical Pharmacist Pager: [phone] phone for 12/05/2016 from 7a-3:30p: [phone] after 3:30p, please call main pharmacy at: [phone] 12/05/2016 9:11 AM

## 2016-12-06 LAB — CBC
HCT: 23.9 % — ABNORMAL LOW (ref 36.0–46.0)
Hemoglobin: 7.5 g/dL — ABNORMAL LOW (ref 12.0–15.0)
MCH: 28.4 pg (ref 26.0–34.0)
MCHC: 31.4 g/dL (ref 30.0–36.0)
MCV: 90.5 fL (ref 78.0–100.0)
PLATELETS: 560 10*3/uL — AB (ref 150–400)
RBC: 2.64 MIL/uL — AB (ref 3.87–5.11)
RDW: 16.5 % — ABNORMAL HIGH (ref 11.5–15.5)
WBC: 23.4 10*3/uL — AB (ref 4.0–10.5)

## 2016-12-06 LAB — GLUCOSE, CAPILLARY
GLUCOSE-CAPILLARY: 129 mg/dL — AB (ref 65–99)
Glucose-Capillary: 144 mg/dL — ABNORMAL HIGH (ref 65–99)
Glucose-Capillary: 120 mg/dL — ABNORMAL HIGH (ref 65–99)
Glucose-Capillary: 101 mg/dL — ABNORMAL HIGH (ref 65–99)
Glucose-Capillary: 108 mg/dL — ABNORMAL HIGH (ref 65–99)

## 2016-12-06 MED ORDER — TRACE MINERALS CR-CU-MN-SE-ZN 10-1000-500-60 MCG/ML IV SOLN
INTRAVENOUS | Status: DC
  Filled 2016-12-06: qty 960

## 2016-12-06 MED ORDER — TRACE MINERALS CR-CU-MN-SE-ZN 10-1000-500-60 MCG/ML IV SOLN
INTRAVENOUS | Status: AC
  Administered 2016-12-06: 18:00:00 via INTRAVENOUS
  Filled 2016-12-06: qty 960

## 2016-12-06 MED ORDER — OXYCODONE HCL 5 MG/5ML PO SOLN
10.0000 mg | ORAL | Status: DC | PRN
  Administered 2016-12-06 – 2016-12-08 (×7): 10 mg via ORAL
  Filled 2016-12-06 (×9): qty 10

## 2016-12-06 MED ORDER — PREMIER PROTEIN SHAKE
11.0000 [oz_av] | Freq: Every day | ORAL | Status: DC
  Administered 2016-12-06: 11 [oz_av] via ORAL
  Filled 2016-12-06 (×2): qty 325.31

## 2016-12-06 MED ORDER — FUROSEMIDE 20 MG PO TABS
20.0000 mg | ORAL_TABLET | Freq: Every day | ORAL | Status: AC
  Administered 2016-12-06 – 2016-12-09 (×4): 20 mg via ORAL
  Filled 2016-12-06 (×4): qty 1

## 2016-12-06 NOTE — Care Management Note (Signed)
Case Management Note  Patient Details  Name: Krystal Reynolds MRN: [id number] Date of Birth: [birth year]  Subjective/Objective:                    Action/Plan:  Will continue to follow for discharge needs. Expected Discharge Date:                  Expected Discharge Plan:  Home/Self Care  In-House Referral:     Discharge planning Services  CM Consult  Post Acute Care Choice:    Choice offered to:     DME Arranged:    DME Agency:     HH Arranged:    HH Agency:     Status of Service:  In process, will continue to follow  If discussed at Long Length of Stay Meetings, dates discussed:    Additional Comments:  Marilu Favre, RN 12/06/2016, 10:28 AM

## 2016-12-06 NOTE — Progress Notes (Signed)
Patient ID: Krystal Reynolds, female   DOB: Mar 18, 1971, 46 y.o.   MRN: [id number] 13 Days Post-Op   Subjective: Continues to improve.  Tolerated full liquids without n/v.  Continues to have flatus and BMs.    Objective: Vital signs in last 24 hours: Temp:  [97.5 F (36.4 C)-98.3 F (36.8 C)] 97.8 F (36.6 C) (08/08 1025) Pulse Rate:  [72-85] 74 (08/08 1025) Resp:  [18-31] 18 (08/08 1123) BP: (145-172)/(64-93) 148/78 (08/08 1025) SpO2:  [95 %-100 %] 98 % (08/08 1123) Last BM Date: 12/05/16  Intake/Output from previous day: 08/07 0701 - 08/08 0700 In: 3304 [P.O.:1085; I.V.:1119; IV Piggyback:1100] Out: 4830 [Urine:4100; Drains:730] Intake/Output this shift: Total I/O In: 200 [P.O.:200] Out: 1700 [Urine:1550; Drains:150]  General appearance: alert, cooperative and no distress GI: Obese with minimal distention.   Drains murky Incision/Wound:  No erythema or drainage, staples intact  Lab Results:   Recent Labs  12/05/16 0408 12/06/16 0427  WBC 27.9* 23.4*  HGB 7.6* 7.5*  HCT 23.9* 23.9*  PLT 537* 560*   BMET  Recent Labs  12/04/16 0432 12/05/16 0408  NA 134* 134*  K 3.7 3.8  CL 98* 99*  CO2 30 29  GLUCOSE 125* 122*  BUN 5* 6  CREATININE 0.57 0.61  CALCIUM 7.7* 7.8*     Studies/Results: No results found.  Anti-infectives: Anti-infectives    Start     Dose/Rate Route Frequency Ordered Stop   12/01/16 0900  vancomycin (VANCOCIN) 1,250 mg in sodium chloride 0.9 % 250 mL IVPB     1,250 mg 166.7 mL/hr over 90 Minutes Intravenous Every 8 hours 12/01/16 0821     11/29/16 1600  vancomycin (VANCOCIN) IVPB 1000 mg/200 mL premix  Status:  Discontinued     1,000 mg 200 mL/hr over 60 Minutes Intravenous Every 8 hours 11/29/16 0656 12/01/16 0821   11/29/16 0700  vancomycin (VANCOCIN) 2,000 mg in sodium chloride 0.9 % 500 mL IVPB     2,000 mg 250 mL/hr over 120 Minutes Intravenous  Once 11/29/16 0656 11/29/16 1015   11/28/16 1200  fluconazole (DIFLUCAN) IVPB 400 mg      400 mg 100 mL/hr over 120 Minutes Intravenous Every 24 hours 11/27/16 1115     11/27/16 1200  piperacillin-tazobactam (ZOSYN) IVPB 3.375 g     3.375 g 12.5 mL/hr over 240 Minutes Intravenous Every 8 hours 11/27/16 1109     11/27/16 1200  fluconazole (DIFLUCAN) IVPB 800 mg     800 mg 100 mL/hr over 240 Minutes Intravenous  Once 11/27/16 1114 11/27/16 1616   11/23/16 2000  ceFAZolin (ANCEF) IVPB 2g/100 mL premix     2 g 200 mL/hr over 30 Minutes Intravenous Every 8 hours 11/23/16 1520 11/23/16 2228   11/23/16 0600  ceFAZolin (ANCEF) IVPB 2g/100 mL premix     2 g 200 mL/hr over 30 Minutes Intravenous To ShortStay Surgical 11/22/16 0814 11/23/16 1205      Assessment/Plan: s/p Procedure(s): WHIPPLE PROCEDURE Apparent leak at pancreatic and/or biliary anastomosis. Soft diet today. DC PCA.   Plan conversion to oral augmentin tomorrow if remains afebrile, WBCs continue to fall, and tolerates diet.   D/c staples Hope to d/c later in week with drains if progress continues.   LOS: 13 days    Kortni Hasten 12/06/2016

## 2016-12-06 NOTE — Progress Notes (Signed)
PHARMACY - ADULT TOTAL PARENTERAL NUTRITION CONSULT NOTE   Pharmacy Consult:  TPN Indication: Intolerance to enteral feeding  Patient Measurements: Height: '5\' 7"'$  (170.2 cm) Weight: 248 lb 14.4 oz (112.9 kg) IBW/kg (Calculated) : 61.6 TPN AdjBW (KG): 74.4 Body mass index is 38.98 kg/m.  Usual weight: 220 lb per patient report   Assessment:  57 YOF admitted on 11/23/16 with cystic mass on pancreas now 8 days s/p Whipple procedure and placement of biliary/pancreatic duct stents for possible pancreatic and/or biliary anastomotic leak. Patient was started on a clear liquid diet on 11/26/16 and Pivot 1.5 tube feeds. Tube feeds were increased to 20 ml/hr on 11/27/16 but abdomen has become increasingly tight and patient was bloated and uncomfortable. Tube feeds were stopped.   Patient is tolerating clear liquid diet (consuming 65-100%) and diet is advanced to soft.  GI: prealbumin improved slightly to 6.2.  2 RUQ drains, O/P 777m, BM x3. PPI IV.   Endo: no hx DM - CBGs well controlled on SSI Insulin requirements in the past 24 hours: 4 units SSI Lytes: 8/7 labs - Na/Cl low but improved, K 3.8, CoCa 9.5 Renal: hx renal cancer and partial nephrectomy. SCr 0.61 (stable), CrCL 115 ml/min, UOP 1.5 ml/kg/hr Pulm: reduced to 2L [place] Cards: BP elevated, HR controlled - PRN hydralazine Hepatobil: LFTs WNL except alk phos at 401, tbili down to 1.3. TG 211 (8/6) Neuro: Celexa, PRN Robaxin, off Dilaudid PCA - pain score ID: [id number] - Vanc/Zosyn/Fluc for suspected intra-abd infxn. Afebrile, WBC down to 23.4, no cx.  Significant pus in drains, transition to PO soon.  Vanc 8/1 >> Zosyn 7/30 >> Fluc 7/30 >>  8/3 VT = 13 mcg/mL on 1g q8 (drawn 1 hr early) >> incr '1250mg'$  q8 8/4 VT = 20 mcg/mL on '1250mg'$  q8 (drawn 1 hr early) >> no chage 8/7 VT = 18 mcg/mL on '1250mg'$  q8 >> no change  Onc: hx bilateral RCC and pancreatic cysts s/p partial left nephrectomy in 2013  Best Practices: Lovenox 40 (BMI 39) - low H/H,  plts elevated TPN Access: PICC 12/02/16 TPN start date: 12/02/16  Nutritional Goals (per RD recommendation on 8/4): 1800-2000 kCal and 110-125 gm of protein per day  Current Nutrition:  Soft diet TPN    Plan:  - Spoke to Dr. BBarry Dienes reduce Clinimix E 5/15 to 40 ml/hr.  Hold lipids.  Plan to d/c TPN tomorrow if patient tolerates soft diet. - Daily MVI and trace elements in TPN - Continue sensitive SSI Q6H - Consider increasing Lovenox to '55mg'$  SQ Q24H for BMI > 30 - F/U with transitioning meds to PO   Allesha Aronoff D. DMina Marble PharmD, BCPS Pager:  [phone]/11/2016, 9:52 AM

## 2016-12-06 NOTE — Progress Notes (Signed)
Nutrition Follow-up  DOCUMENTATION CODES:   Obesity unspecified  INTERVENTION:   -TPN management per pharmacy -Premier Protein daily -RD will follow for diet advancement  NUTRITION DIAGNOSIS:   Increased nutrient needs related to  (post-op healing) as evidenced by estimated needs.  Progressing  GOAL:   Patient will meet greater than or equal to 90% of their needs  Progressing  MONITOR:   PO intake, Supplement acceptance, Diet advancement, Labs, Weight trends, Skin, I & O's  REASON FOR ASSESSMENT:   Consult New TPN/TNA  ASSESSMENT:   46 year old female PMHx HTN, renal cancer s/p bilateral partial nephrectomy and asymptomatic pancreatic mass. 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 Lesion has grown and due to risk of renal cancer, elected to undergo whipple 11/23/16.   S/p PROCEDURES PERFORMED 11/23/16:  Classic pancreaticoduodenectomy  Placement of biliary stent (segment of 8 Fr pediatric feeding tube) Placement of pancreatic duct stent (segment of 5 Fr pediatric feeding tube 18 Fr Jejunostomy feeding tube  7/28- TF initiated (Pivot 1.5 @ 10 ml/hr) 7/29- TF increased (Pivot 1.5 @ 20 ml/hr) 8/1- sips and chips added. Abdomen "feels tight" 8/3 - TF stopped due to bloating/discomfort.  8/4-  likely leak at pancreatic or biliary anastomosis. Unable to tolerate PO intake -> TPN, PICC placed  Spoke with pt, who was in good spirits this AM. She reports good tolerance of full liquids- noted meal completion 50-75%. She consumed all of her breakfast expect for 50% of her juice for breakfast. She is looking forward to consuming a soft diet for lunch.  Pt remains on TPN, currently receiving Clinimix E 5/15 at 60ml/hr and 20% lipid emulsion at 20 cc/hr over 12 hours, providing 1900 kcals and 100 gras protein daily, meeting 100% of estimated kcal needs and 90% of estimated protein needs. Per pharmacy notes, plan to reduce Clinimix E 5/15 to 40 ml/hr and hold lipids at 1800. Per  Dr. Barry Dienes, plan to d/c TPN tomorrow if patient tolerates soft diet.  Labs reviewed: CBGS: 107-144.   Diet Order:  TPN (CLINIMIX-E) Adult DIET SOFT Room service appropriate? Yes; Fluid consistency: Thin TPN (CLINIMIX-E) Adult  Skin:   (closed abdominal incision)  Last BM:  12/06/16  Height:   Ht Readings from Last 1 Encounters:  11/29/16 5\' 7"  (1.702 m)    Weight:   Wt Readings from Last 1 Encounters:  11/29/16 248 lb 14.4 oz (112.9 kg)    Ideal Body Weight:  61.4 kg  BMI:  Body mass index is 38.98 kg/m.  Estimated Nutritional Needs:   Kcal:  1800-2000  Protein:  110-125 grams  Fluid:  1.8-2.0 L  EDUCATION NEEDS:   Education needs addressed  Yessica Putnam A. Jimmye Norman, RD, LDN, CDE Pager: [phone] After hours Pager: [phone]

## 2016-12-07 LAB — COMPREHENSIVE METABOLIC PANEL
ALBUMIN: 2.1 g/dL — AB (ref 3.5–5.0)
ALK PHOS: 397 U/L — AB (ref 38–126)
ALT: 40 U/L (ref 14–54)
AST: 36 U/L (ref 15–41)
Anion gap: 10 (ref 5–15)
BUN: 8 mg/dL (ref 6–20)
CHLORIDE: 100 mmol/L — AB (ref 101–111)
CO2: 26 mmol/L (ref 22–32)
CREATININE: 0.7 mg/dL (ref 0.44–1.00)
Calcium: 8.1 mg/dL — ABNORMAL LOW (ref 8.9–10.3)
GFR calc non Af Amer: >60 mL/min (ref >60)
GLUCOSE: 109 mg/dL — AB (ref 65–99)
Potassium: 3.7 mmol/L (ref 3.5–5.1)
SODIUM: 136 mmol/L (ref 135–145)
Total Bilirubin: 1.2 mg/dL (ref 0.3–1.2)
Total Protein: 6.4 g/dL — ABNORMAL LOW (ref 6.5–8.1)

## 2016-12-07 LAB — MAGNESIUM: MAGNESIUM: 2 mg/dL (ref 1.7–2.4)

## 2016-12-07 LAB — GLUCOSE, CAPILLARY: Glucose-Capillary: 118 mg/dL — ABNORMAL HIGH (ref 65–99)

## 2016-12-07 LAB — PHOSPHORUS: PHOSPHORUS: 3.9 mg/dL (ref 2.5–4.6)

## 2016-12-07 MED ORDER — PANTOPRAZOLE SODIUM 40 MG PO TBEC
40.0000 mg | DELAYED_RELEASE_TABLET | Freq: Every day | ORAL | Status: DC
  Administered 2016-12-07 – 2016-12-08 (×2): 40 mg via ORAL
  Filled 2016-12-07 (×2): qty 1

## 2016-12-07 MED ORDER — HYDROMORPHONE HCL 1 MG/ML IJ SOLN
0.5000 mg | INTRAMUSCULAR | Status: DC | PRN
  Administered 2016-12-07: 0.5 mg via INTRAVENOUS
  Administered 2016-12-07 – 2016-12-08 (×4): 1 mg via INTRAVENOUS
  Filled 2016-12-07 (×5): qty 1
  Filled 2016-12-07: qty 2

## 2016-12-07 MED ORDER — AMOXICILLIN-POT CLAVULANATE 875-125 MG PO TABS
1.0000 | ORAL_TABLET | Freq: Two times a day (BID) | ORAL | Status: DC
  Administered 2016-12-07 – 2016-12-09 (×5): 1 via ORAL
  Filled 2016-12-07 (×5): qty 1

## 2016-12-07 MED ORDER — FLUCONAZOLE 100 MG PO TABS
200.0000 mg | ORAL_TABLET | Freq: Every day | ORAL | Status: DC
  Administered 2016-12-07 – 2016-12-09 (×3): 200 mg via ORAL
  Filled 2016-12-07 (×3): qty 2

## 2016-12-07 MED ORDER — PREMIER PROTEIN SHAKE
11.0000 [oz_av] | Freq: Two times a day (BID) | ORAL | Status: DC
  Administered 2016-12-09: 11 [oz_av] via ORAL
  Filled 2016-12-07 (×8): qty 325.31

## 2016-12-07 NOTE — Progress Notes (Signed)
Pt's drain leaking, dressing changes done twice tonight. Will notify doctor.

## 2016-12-07 NOTE — Care Management Note (Signed)
Case Management Note  Patient Details  Name: DOREENA MAULDEN MRN: [id number] Date of Birth: 08-29-1970  Subjective/Objective:                    Action/Plan:  Discussed discharge planning with patient at bedside. Confirmed face sheet information. Unsure if tube feedings needed at discharge, patient currently on calorie count. Provided list of home health agencies to patient , she will discuss with her mom who is a Marine scientist and then decide on which agency.   Will follow up with patient tomorrow. Expected Discharge Date:                  Expected Discharge Plan:  [place]  In-House Referral:     Discharge planning Services  CM Consult  Post Acute Care Choice:  Home Health Choice offered to:  Patient  DME Arranged:    DME Agency:     HH Arranged:  RN [place] Agency:     Status of Service:  In process, will continue to follow  If discussed at Long Length of Stay Meetings, dates discussed:    Additional Comments:  Marilu Favre, RN 12/07/2016, 1:06 PM

## 2016-12-07 NOTE — Progress Notes (Signed)
PHARMACY - ADULT TOTAL PARENTERAL NUTRITION CONSULT NOTE   Pharmacy Consult:  TPN Indication: Intolerance to enteral feeding  Patient Measurements: Height: '5\' 7"'$  (170.2 cm) Weight: 248 lb 14.4 oz (112.9 kg) IBW/kg (Calculated) : 61.6 TPN AdjBW (KG): 74.4 Body mass index is 38.98 kg/m.  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 Patient was started on a clear liquid diet on 11/26/16 and Pivot 1.5 tube feeds. Tube feeds were increased to 20 ml/hr on 11/27/16 but abdomen has become increasingly tight and patient was bloated and uncomfortable. Tube feeds were stopped.   Spoke to patient, she consumed close to 50% of her meals.  Patient denies nausea and vomiting.  There is some abdominal discomfort when she eats, but nothing to prevent her from eating.  She didn't eat more because of food preference.  Patient likes the premier protein.  GI: prealbumin improved slightly to 6.2.  2 RUQ drains, O/P 351m, BM x4. PPI PO. Endo: no hx DM - CBGs well controlled on SSI Insulin requirements in the past 24 hours: 2 units SSI Lytes: low CL, others WNL Renal: hx renal cancer and partial nephrectomy. SCr 0.7 (stable), CrCL 115 ml/min, UOP 0.9 ml/kg/hr Pulm: now on RA Cards: BP elevated, HR controlled - PRN hydralazine, Lasix resumed Hepatobil: LFTs WNL except alk phos (down to 397), tbili normalized, TG 211 Neuro: Celexa, PRN Robaxin/Dilaudid/oxy, pain score 0-9 ID: [id number] - Augmentin/Fluc for intra-abd infxn, significant pus in drains. Afebrile, WBC down to 23.4, no cx.  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 BBarry Dienes D/C TPN post this bag - D/C SSI/CBG checks, TPN labs and nursing care orders - Consider increasing Lovenox to '55mg'$  SQ Q24H for BMI > 30   Sharol Croghan D. DMina Marble PharmD, BCPS Pager:  [phone]/12/2016, 8:56 AM

## 2016-12-07 NOTE — Progress Notes (Signed)
Calorie Count Note  48 hour calorie count ordered.  Diet: Soft  Supplements: Premier Protein q HS  Breakfast: 129 kcals, 2 grams protein  No other meal completion data available at this time. Per pharmacy note, pt like Premier Protein supplements. TPN to be d/c tonight.  Nutrition Dx: Increased nutrient needs related to  (post-op healing) as evidenced by estimated needs; ongoing  Goal: Patient will meet greater than or equal to 90% of their needs; progressing  Intervention:   -RD will increase Premier Protein to BID -Follow-up for calorie count results on Friday, 8/10  Anav Lammert A. Jimmye Norman, RD, LDN, CDE Pager: [phone] After hours Pager: [phone]

## 2016-12-07 NOTE — Progress Notes (Signed)
Patient ID: Krystal Reynolds, female   DOB: [birth year], 46 y.o.   MRN: [id number] 14 Days Post-Op   Subjective: Did well with soft diet.  Mentioned that appetite was not great, but had no n/v and continues to have flatus and BM.  Did have a few rough moments with d/c of PCA, but OK.    Objective: Vital signs in last 24 hours: Temp:  [97.7 F (36.5 C)-98.6 F (37 C)] 97.8 F (36.6 C) (08/09 1300) Pulse Rate:  [73-83] 83 (08/09 1300) Resp:  [16] 16 (08/09 1300) BP: (150-167)/(76-84) 150/84 (08/09 1300) SpO2:  [94 %-96 %] 94 % (08/09 1300) Last BM Date: 12/06/16  Intake/Output from previous day: 08/08 0701 - 08/09 0700 In: 2894 [P.O.:1380; I.V.:664; IV Piggyback:850] Out: 2875 [Urine:2550; Drains:325] Intake/Output this shift: Total I/O In: 240 [P.O.:240] Out: 225 [Drains:225]  General appearance: alert, cooperative and no distress GI: soft, non distended, approp tender.  Some leakage around medial drain.    Drains murky Incision/Wound:  No erythema or drainage, staples intact  Lab Results:   Recent Labs  12/05/16 0408 12/06/16 0427  WBC 27.9* 23.4*  HGB 7.6* 7.5*  HCT 23.9* 23.9*  PLT 537* 560*   BMET  Recent Labs  12/05/16 0408 12/07/16 0415  NA 134* 136  K 3.8 3.7  CL 99* 100*  CO2 29 26  GLUCOSE 122* 109*  BUN 6 8  CREATININE 0.61 0.70  CALCIUM 7.8* 8.1*     Studies/Results: No results found.  Anti-infectives: Anti-infectives    Start     Dose/Rate Route Frequency Ordered Stop   12/07/16 1000  amoxicillin-clavulanate (AUGMENTIN) 875-125 MG per tablet 1 tablet     1 tablet Oral Every 12 hours 12/07/16 0808     12/07/16 1000  fluconazole (DIFLUCAN) tablet 200 mg     200 mg Oral Daily 12/07/16 0808     12/01/16 0900  vancomycin (VANCOCIN) 1,250 mg in sodium chloride 0.9 % 250 mL IVPB  Status:  Discontinued     1,250 mg 166.7 mL/hr over 90 Minutes Intravenous Every 8 hours 12/01/16 0821 12/07/16 0808   11/29/16 1600  vancomycin (VANCOCIN) IVPB 1000  mg/200 mL premix  Status:  Discontinued     1,000 mg 200 mL/hr over 60 Minutes Intravenous Every 8 hours 11/29/16 0656 12/01/16 0821   11/29/16 0700  vancomycin (VANCOCIN) 2,000 mg in sodium chloride 0.9 % 500 mL IVPB     2,000 mg 250 mL/hr over 120 Minutes Intravenous  Once 11/29/16 0656 11/29/16 1015   11/28/16 1200  fluconazole (DIFLUCAN) IVPB 400 mg  Status:  Discontinued     400 mg 100 mL/hr over 120 Minutes Intravenous Every 24 hours 11/27/16 1115 12/07/16 0808   11/27/16 1200  piperacillin-tazobactam (ZOSYN) IVPB 3.375 g  Status:  Discontinued     3.375 g 12.5 mL/hr over 240 Minutes Intravenous Every 8 hours 11/27/16 1109 12/07/16 0808   11/27/16 1200  fluconazole (DIFLUCAN) IVPB 800 mg     800 mg 100 mL/hr over 240 Minutes Intravenous  Once 11/27/16 1114 11/27/16 1616   11/23/16 2000  ceFAZolin (ANCEF) IVPB 2g/100 mL premix     2 g 200 mL/hr over 30 Minutes Intravenous Every 8 hours 11/23/16 1520 11/23/16 2228   11/23/16 0600  ceFAZolin (ANCEF) IVPB 2g/100 mL premix     2 g 200 mL/hr over 30 Minutes Intravenous To ShortStay Surgical 11/22/16 0814 11/23/16 1205      Assessment/Plan: s/p Procedure(s): WHIPPLE PROCEDURE Apparent leak at  pancreatic and/or biliary anastomosis. Soft diet. Augmentin and oral fluconazole.   Wean TNA. Calorie counts Hope to d/c later in week with drains if progress continues.   LOS: 14 days    Reiner Loewen 12/07/2016

## 2016-12-08 LAB — CBC
HCT: 25.8 % — ABNORMAL LOW (ref 36.0–46.0)
Hemoglobin: 8.2 g/dL — ABNORMAL LOW (ref 12.0–15.0)
MCH: 28.3 pg (ref 26.0–34.0)
MCHC: 31.8 g/dL (ref 30.0–36.0)
MCV: 89 fL (ref 78.0–100.0)
Platelets: 632 10*3/uL — ABNORMAL HIGH (ref 150–400)
RBC: 2.9 MIL/uL — ABNORMAL LOW (ref 3.87–5.11)
RDW: 16.3 % — AB (ref 11.5–15.5)
WBC: 25.1 10*3/uL — ABNORMAL HIGH (ref 4.0–10.5)

## 2016-12-08 LAB — COMPREHENSIVE METABOLIC PANEL
ALT: 32 U/L (ref 14–54)
ANION GAP: 8 (ref 5–15)
AST: 26 U/L (ref 15–41)
Albumin: 2.1 g/dL — ABNORMAL LOW (ref 3.5–5.0)
Alkaline Phosphatase: 338 U/L — ABNORMAL HIGH (ref 38–126)
BILIRUBIN TOTAL: 1 mg/dL (ref 0.3–1.2)
BUN: 9 mg/dL (ref 6–20)
CO2: 26 mmol/L (ref 22–32)
Calcium: 8.3 mg/dL — ABNORMAL LOW (ref 8.9–10.3)
Chloride: 99 mmol/L — ABNORMAL LOW (ref 101–111)
Creatinine, Ser: 0.76 mg/dL (ref 0.44–1.00)
GFR calc Af Amer: >60 mL/min (ref >60)
Glucose, Bld: 115 mg/dL — ABNORMAL HIGH (ref 65–99)
POTASSIUM: 3.9 mmol/L (ref 3.5–5.1)
Sodium: 133 mmol/L — ABNORMAL LOW (ref 135–145)
TOTAL PROTEIN: 6.4 g/dL — AB (ref 6.5–8.1)

## 2016-12-08 LAB — MRSA PCR SCREENING: MRSA BY PCR: NEGATIVE

## 2016-12-08 MED ORDER — METHOCARBAMOL 500 MG PO TABS: 500.0000 mg | ORAL_TABLET | Freq: Four times a day (QID) | ORAL | 1 refills | Status: DC | PRN

## 2016-12-08 MED ORDER — AMOXICILLIN-POT CLAVULANATE 875-125 MG PO TABS: 1.0000 | ORAL_TABLET | Freq: Two times a day (BID) | ORAL | 1 refills | Status: DC

## 2016-12-08 MED ORDER — FLUCONAZOLE 200 MG PO TABS
200.0000 mg | ORAL_TABLET | Freq: Every day | ORAL | 0 refills | Status: DC
Start: 2016-12-08 — End: 2019-01-01

## 2016-12-08 MED ORDER — OXYCODONE HCL 5 MG PO TABS
5.0000 mg | ORAL_TABLET | ORAL | Status: DC | PRN
Start: 2016-12-08 — End: 2016-12-09
  Administered 2016-12-08 (×3): 15 mg via ORAL
  Administered 2016-12-08: 10 mg via ORAL
  Administered 2016-12-08 – 2016-12-09 (×5): 15 mg via ORAL
  Filled 2016-12-08 (×9): qty 3

## 2016-12-08 MED ORDER — METHOCARBAMOL 500 MG PO TABS
500.0000 mg | ORAL_TABLET | Freq: Four times a day (QID) | ORAL | Status: DC | PRN
  Administered 2016-12-09: 500 mg via ORAL
  Filled 2016-12-08: qty 1

## 2016-12-08 MED ORDER — OXYCODONE HCL 5 MG PO TABS: 5.0000 mg | ORAL_TABLET | ORAL | 0 refills | Status: DC | PRN

## 2016-12-08 NOTE — Progress Notes (Signed)
Calorie Count Note  48 hour calorie count ordered.  Diet: Soft Supplements: Premier Protein BID  TPN d/c 12/07/16 PM. Hopefully discontinuation of TPN will stimulate appetite. On 12/07/16, pt was receiving Clinimix E 5/15 to 40 ml/hr (d/c after bag finished). TPN providing 682 kcals and 48 grams protein.   Day 1 (12/07/16) Breakfast: 129 kcals, 2 grams protein Lunch: 150 kcals, 1 gram protein Dinner: 191 kcals, 0 grams protein Supplements: 1 Premier Protein supplement (160 kcals, 30 grams protein)  Total intake (PO's only): 630 kcal (33% of minimum estimated needs)  33 grams protein (30% of minimum estimated needs)  Total intake (PO's and TPN): 1312 kcal (73% of minimum estimated needs)  81 grams protein (74% of minimum estimated needs)  Nutrition Dx: Increased nutrient needsrelated to (post-op healing)as evidenced by estimated needs; ongoing  Goal: Patient will meet greater than or equal to 90% of their needs; progressing  Intervention:   -Continue Premier Protein BID -Follow-up for calorie count results on Monday, 12/11/16  Krystal Reynolds A. Jimmye Norman, RD, LDN, CDE Pager: [phone] After hours Pager: [phone]

## 2016-12-08 NOTE — Progress Notes (Signed)
Patient ID: Krystal Reynolds, female   DOB: [birth year], 46 y.o.   MRN: [id number] 15 Days Post-Op   Subjective: Pt continues to improve.  Switched to oral antibiotics yesterday.  No n/v.  Decreased drainage around drains yesterday.  Objective: Vital signs in last 24 hours: Temp:  [98.3 F (36.8 C)-98.4 F (36.9 C)] 98.3 F (36.8 C) (08/10 0611) Pulse Rate:  [83-89] 83 (08/10 0611) BP: (154-159)/(81-85) 154/85 (08/10 0611) SpO2:  [91 %] 91 % (08/10 0611) Last BM Date: 12/07/16  Intake/Output from previous day: 08/09 0701 - 08/10 0700 In: 360 [P.O.:360] Out: 375 [Drains:375] Intake/Output this shift: No intake/output data recorded.  General appearance: alert, cooperative and no distress GI: soft, non distended, approp tender.  Some leakage around medial drain.    Drains murky Incision/Wound:  No erythema or drainage, staples intact  Lab Results:   Recent Labs  12/06/16 0427 12/08/16 1043  WBC 23.4* 25.1*  HGB 7.5* 8.2*  HCT 23.9* 25.8*  PLT 560* 632*   BMET  Recent Labs  12/07/16 0415 12/08/16 1043  NA 136 133*  K 3.7 3.9  CL 100* 99*  CO2 26 26  GLUCOSE 109* 115*  BUN 8 9  CREATININE 0.70 0.76  CALCIUM 8.1* 8.3*     Studies/Results: No results found.  Anti-infectives: Anti-infectives    Start     Dose/Rate Route Frequency Ordered Stop   12/08/16 0000  amoxicillin-clavulanate (AUGMENTIN) 875-125 MG tablet     1 tablet Oral Every 12 hours 12/08/16 0819     12/08/16 0000  fluconazole (DIFLUCAN) 200 MG tablet     200 mg Oral Daily 12/08/16 0819     12/07/16 1000  amoxicillin-clavulanate (AUGMENTIN) 875-125 MG per tablet 1 tablet     1 tablet Oral Every 12 hours 12/07/16 0808     12/07/16 1000  fluconazole (DIFLUCAN) tablet 200 mg     200 mg Oral Daily 12/07/16 0808     12/01/16 0900  vancomycin (VANCOCIN) 1,250 mg in sodium chloride 0.9 % 250 mL IVPB  Status:  Discontinued     1,250 mg 166.7 mL/hr over 90 Minutes Intravenous Every 8 hours 12/01/16 0821  12/07/16 0808   11/29/16 1600  vancomycin (VANCOCIN) IVPB 1000 mg/200 mL premix  Status:  Discontinued     1,000 mg 200 mL/hr over 60 Minutes Intravenous Every 8 hours 11/29/16 0656 12/01/16 0821   11/29/16 0700  vancomycin (VANCOCIN) 2,000 mg in sodium chloride 0.9 % 500 mL IVPB     2,000 mg 250 mL/hr over 120 Minutes Intravenous  Once 11/29/16 0656 11/29/16 1015   11/28/16 1200  fluconazole (DIFLUCAN) IVPB 400 mg  Status:  Discontinued     400 mg 100 mL/hr over 120 Minutes Intravenous Every 24 hours 11/27/16 1115 12/07/16 0808   11/27/16 1200  piperacillin-tazobactam (ZOSYN) IVPB 3.375 g  Status:  Discontinued     3.375 g 12.5 mL/hr over 240 Minutes Intravenous Every 8 hours 11/27/16 1109 12/07/16 0808   11/27/16 1200  fluconazole (DIFLUCAN) IVPB 800 mg     800 mg 100 mL/hr over 240 Minutes Intravenous  Once 11/27/16 1114 11/27/16 1616   11/23/16 2000  ceFAZolin (ANCEF) IVPB 2g/100 mL premix     2 g 200 mL/hr over 30 Minutes Intravenous Every 8 hours 11/23/16 1520 11/23/16 2228   11/23/16 0600  ceFAZolin (ANCEF) IVPB 2g/100 mL premix     2 g 200 mL/hr over 30 Minutes Intravenous To ShortStay Surgical 11/22/16 0814 11/23/16 1205  Assessment/Plan: s/p Procedure(s): WHIPPLE PROCEDURE Apparent leak at pancreatic and/or biliary anastomosis. Soft diet. Augmentin and oral fluconazole for 2 additional weeks.   Off TNA Calorie counts D/c tomorrow if stable.     LOS: 15 days    Eyva Califano 12/08/2016

## 2016-12-08 NOTE — Care Management Note (Addendum)
Case Management Note  Patient Details  Name: Krystal Reynolds MRN: [id number] Date of Birth: [birth year]  Subjective/Objective:                    Action/Plan: Patient choices for home health [place], [place] all three do not have staffing for [facility] in [place].  [place]  has accepted referral.  Expected Discharge Date:                  Expected Discharge Plan:  [place]  In-House Referral:     Discharge planning Services  CM Consult  Post Acute Care Choice:  Home Health Choice offered to:  Patient  DME Arranged:    DME Agency:     HH Arranged:  RN [place] Agency:  [place]  Status of Service:  In process, will continue to follow  If discussed at Long Length of Stay Meetings, dates discussed:    Additional Comments:  Marilu Favre, RN 12/08/2016, 10:19 AM

## 2016-12-08 NOTE — Discharge Summary (Signed)
Physician Discharge Summary  Patient ID: DENI BERTI MRN: [id number] DOB/AGE: [birth year] 46 y.o.  Admit date: 11/23/2016 Discharge date: 12/09/2016  Admission Diagnoses: Patient Active Problem List   Diagnosis Date Noted  . Cyst of pancreas 11/23/2016  . Renal neoplasm 10/02/2013  . Cancer ([place])   . Renal mass 12/07/2011  . Pancreatic head mass 10/30/2011  . Morbid obesity ([place]) 09/07/2011    Discharge Diagnoses:  Active Problems:   Pancreatic head mass   Cyst of pancreas Pancreatic leak  Discharged Condition: stable  Hospital Course:  Pt was admitted to the ICU following a Whipple for enlarging pancreatic head mass.  She had some pain and nausea initially.  She required significant amount of narcotic on the PCA.  She had an epidural as well.  Her WBC count went up high pretty quickly after surgery.  Tube feeds were started at 10 ml/hr.  She started feeling a lot of tightness in her abdominal cavity.  She also had quite a bit of nausea.  Her drains had some bile staining and became murky so she was empirically started on broad spectrum antibiotics (vanc/zosyn).  Her white count went into the 40s. Fluconazole was added. A CT was obtained to evaluate for undrained fluid collections, and there was a small amount of ascites in the pelvis.  An ultrasound was obtained and the pocket of fluid was too small to sample to see if it was bilious or contaminated.  She was placed on TNA because of difficulty with nausea.  Her WBC count finally started to go down.  She finally had a bowel movement and her abdominal tightness improved.  At this point, her diet was slowly advanced.  She continued her downward trend in leukocytosis.  Her energy level also improved significantly.  She was transitioned to oral narcotics which was difficult.  She also was transitioned to oral antibiotics (augmentin/fluconazole).  She remained stable.  She was discharged to home in stable condition.  Staples were removed prior  to discharge. Her PO intake was reasonable, so she was not sent home on tube feeds.    Consults: None  Significant Diagnostic Studies: labs: WBCs 26.1 prior to d/c.  Cr normal, bilirubin nl.  FSG low 100s.    Treatments: antibiotics: vancomycin, Zosyn and fluconazole and surgery: whipple  Discharge Exam: Blood pressure (!) 151/72, pulse 94, temperature 98 F (36.7 C), temperature source Oral, resp. rate 16, height 5\' 7"  (1.702 m), weight 97.1 kg (214 lb), SpO2 94 %. General appearance: alert, cooperative and no distress Resp: breathing comfortably Cardio: regular rate and rhythm GI: soft, non distended, approp tender at incision.  no drainage from incision. Extremities: extremities normal, atraumatic, no cyanosis or edema drains purulent.  Disposition: 01-Home or Self Care   Allergies as of 12/09/2016   No Known Allergies     Medication List    TAKE these medications   amoxicillin-clavulanate 875-125 MG tablet Commonly known as:  AUGMENTIN Take 1 tablet by mouth every 12 (twelve) hours.   citalopram 20 MG tablet Commonly known as:  CELEXA Take 20 mg by mouth daily with breakfast.   fluconazole 200 MG tablet Commonly known as:  DIFLUCAN Take 1 tablet (200 mg total) by mouth daily.   ibuprofen 200 MG tablet Commonly known as:  ADVIL,MOTRIN Take 400-600 mg by mouth every 8 (eight) hours as needed for headache or mild pain. Notes to patient:  If needed for headache   methocarbamol 500 MG tablet Commonly known as:  ROBAXIN  Take 1 tablet (500 mg total) by mouth every 6 (six) hours as needed for muscle spasms.   oxyCODONE 5 MG immediate release tablet Commonly known as:  Oxy IR/ROXICODONE Take 1-3 tablets (5-15 mg total) by mouth every 2 (two) hours as needed for moderate pain, severe pain or breakthrough pain. Notes to patient:  If needed for pain      Follow-up Information    Stark Klein, MD Follow up.   Specialty:  General Surgery Contact information: [place] [id number] [phone]        [facility] Follow up.   Specialty:  [place] Why:  Home health Nurse  Contact information: [place] [address] [phone]           Signed: Stark Klein 12/13/2016, 12:18 PM

## 2016-12-08 NOTE — Discharge Instructions (Signed)
Surgical Drain Home [facility] °Surgical drains are used to remove extra fluid that normally builds up in a surgical wound after surgery. A surgical drain helps to heal a surgical wound. Different kinds of surgical drains include: °· Active drains. These drains use suction to pull drainage away from the surgical wound. Drainage flows through a tube to a container outside of the body. It is important to keep the bulb or the drainage container flat (compressed) at all times, except while you empty it. Flattening the bulb or container creates suction. The two most common types of active drains are bulb drains and Hemovac drains. °· Passive drains. These drains allow fluid to drain naturally, by gravity. Drainage flows through a tube to a bandage (dressing) or a container outside of the body. Passive drains do not need to be emptied. The most common type of passive drain is the Penrose drain. ° °A drain is placed during surgery. Immediately after surgery, drainage is usually bright red and a little thicker than water. The drainage may gradually turn yellow or pink and become thinner. It is likely that your health care provider will remove the drain when the drainage stops or when the amount decreases to 1-2 Tbsp (15-30 mL) during a 24-hour period. °How to care for your surgical drain °· Keep the skin around the drain dry and covered with a dressing at all times. °· Check your drain area every day for signs of infection. Check for: °? More redness, swelling, or pain. °? Pus or a bad smell. °? Cloudy drainage. °Follow instructions from your health care provider about how to take care of your drain and how to change your dressing. Change your dressing at least one time every day. Change it more often if needed to keep the dressing dry. Make sure you: °1. Gather your supplies, including: °? Tape. °? Germ-free cleaning solution (sterile saline). °? Split gauze drain sponge: 4 x 4 inches (10 x 10 cm). °? Gauze square: 4 x 4 inches  (10 x 10 cm). °2. Wash your hands with soap and water before you change your dressing. If soap and water are not available, use hand sanitizer. °3. Remove the old dressing. Avoid using scissors to do that. °4. Use sterile saline to clean your skin around the drain. °5. Place the tube through the slit in a drain sponge. Place the drain sponge so that it covers your wound. °6. Place the gauze square or another drain sponge on top of the drain sponge that is on the wound. Make sure the tube is between those layers. °7. Tape the dressing to your skin. °8. If you have an active bulb or Hemovac drain, tape the drainage tube to your skin 1-2 inches (2.5-5 cm) below the place where the tube enters your body. Taping keeps the tube from pulling on any stitches (sutures) that you have. °9. Wash your hands with soap and water. °10. Write down the color of your drainage and how often you change your dressing. ° °How to empty your active bulb or Hemovac drain °1. Make sure that you have a measuring cup that you can empty your drainage into. °2. Wash your hands with soap and water. If soap and water are not available, use hand sanitizer. °3. Gently move your fingers down the tube while squeezing very lightly. This is called stripping the tube. This clears any drainage, clots, or tissue from the tube. °? Do not pull on the tube. °? You may need to strip   the tube several times every day to keep the tube clear. °4. Open the bulb cap or the drain plug. Do not touch the inside of the cap or the bottom of the plug. °5. Empty all of the drainage into the measuring cup. °6. Compress the bulb or the container and replace the cap or the plug. To compress the bulb or the container, squeeze it firmly in the middle while you close the cap or plug the container. °7. Write down the amount of drainage that you have in each 24-hour period. If you have less than 2 Tbsp (30 mL) of drainage during 24 hours, contact your health care  provider. °8. Flush the drainage down the toilet. °9. Wash your hands with soap and water. °Contact a health care provider if: °· You have more redness, swelling, or pain around your drain area. °· The amount of drainage that you have is increasing instead of decreasing. °· You have pus or a bad smell coming from your drain area. °· You have a fever. °· You have drainage that is cloudy. °· There is a sudden stop or a sudden decrease in the amount of drainage that you have. °· Your tube falls out. °· Your active drain does not stay compressed after you empty it. °This information is not intended to replace advice given to you by your health care provider. Make sure you discuss any questions you have with your health care provider. °Document Released: 04/14/2000 Document Revised: 09/23/2015 Document Reviewed: 11/04/2014 °Elsevier Interactive Patient Education © 2018 [employer] ° ° °CCS      [facility] °[phone] ° °ABDOMINAL SURGERY: POST OP INSTRUCTIONS ° °Always review your discharge instruction sheet given to you by the facility where your surgery was performed. ° °IF YOU HAVE DISABILITY OR FAMILY LEAVE FORMS, YOU MUST BRING THEM TO THE OFFICE FOR PROCESSING.  PLEASE DO NOT GIVE THEM TO YOUR DOCTOR. ° °1. A prescription for pain medication may be given to you upon discharge.  Take your pain medication as prescribed, if needed.  If narcotic pain medicine is not needed, then you may take acetaminophen (Tylenol) or ibuprofen (Advil) as needed. °2. Take your usually prescribed medications unless otherwise directed. °3. If you need a refill on your pain medication, please contact your pharmacy. They will contact our office to request authorization.  Prescriptions will not be filled after 5pm or on week-ends. °4. You should follow a light diet the first few days after arrival home, such as soup and crackers, pudding, etc.unless your doctor has advised otherwise. A high-fiber, low fat diet can be  resumed as tolerated.   Be sure to include lots of fluids daily. Most patients will experience some swelling and bruising on the chest and neck area.  Ice packs will help.  Swelling and bruising can take several days to resolve °5. Most patients will experience some swelling and bruising in the area of the incision. Ice pack will help. Swelling and bruising can take several days to resolve..  °6. It is common to experience some constipation if taking pain medication after surgery.  Increasing fluid intake and taking a stool softener will usually help or prevent this problem from occurring.  A mild laxative (Milk of Magnesia or Miralax) should be taken according to package directions if there are no bowel movements after 48 hours. °7.  You may have steri-strips (small skin tapes) in place directly over the incision.  These strips should be left on the skin for 10-14   days.  If your surgeon used skin glue on the incision, you may shower in 48 hours.  The glue will flake off over the next 2-3 weeks.  Any sutures or staples will be removed at the office during your follow-up visit. You may find that a light gauze bandage over your incision may keep your staples from being rubbed or pulled. You may shower and replace the bandage daily. °8. ACTIVITIES:  You may resume regular (light) daily activities beginning the next day--such as daily self-care, walking, climbing stairs--gradually increasing activities as tolerated.  You may have sexual intercourse when it is comfortable.  Refrain from any heavy lifting or straining until approved by your doctor. °a. You may drive when you no longer are taking prescription pain medication, you can comfortably wear a seatbelt, and you can safely maneuver your car and apply brakes °b. Return to Work: __________8 weeks if applicable_________________________ °9. You should see your doctor in the office for a follow-up appointment approximately two weeks after your surgery.  Make sure that  you call for this appointment within a day or two after you arrive home to insure a convenient appointment time. °OTHER INSTRUCTIONS:  °_____________________________________________________________ °_____________________________________________________________ ° °WHEN TO CALL YOUR DOCTOR: °1. Fever over 101.0 °2. Inability to urinate °3. Nausea and/or vomiting °4. Extreme swelling or bruising °5. Continued bleeding from incision. °6. Increased pain, redness, or drainage from the incision. °7. Difficulty swallowing or breathing °8. Muscle cramping or spasms. °9. Numbness or tingling in hands or feet or around lips. ° °The clinic staff is available to answer your questions during regular business hours.  Please don’t hesitate to call and ask to speak to one of the nurses if you have concerns. ° °For further questions, please visit [email or web address] ° ° ° °

## 2016-12-09 LAB — CBC
HEMATOCRIT: 27 % — AB (ref 36.0–46.0)
HEMOGLOBIN: 8.4 g/dL — AB (ref 12.0–15.0)
MCH: 27.7 pg (ref 26.0–34.0)
MCHC: 31.1 g/dL (ref 30.0–36.0)
MCV: 89.1 fL (ref 78.0–100.0)
Platelets: 756 10*3/uL — ABNORMAL HIGH (ref 150–400)
RBC: 3.03 MIL/uL — AB (ref 3.87–5.11)
RDW: 16.2 % — ABNORMAL HIGH (ref 11.5–15.5)
WBC: 26.1 10*3/uL — ABNORMAL HIGH (ref 4.0–10.5)

## 2016-12-09 NOTE — Progress Notes (Signed)
Pt for discharge going home, health teachings given, next appointment, prescriptions, IV team need to discontinue PICC line, given pain meds, all personal belongings given, no s/s of distress noted.

## 2016-12-09 NOTE — Progress Notes (Signed)
Patient ID: Krystal Reynolds, female   DOB: [birth year], 45 y.o.   MRN: [id number] 16 Days Post-Op   Subjective: Pt continues to improve. Tolerating PO Abx.  No n/v.    Objective: Vital signs in last 24 hours: Temp:  [98.3 F (36.8 C)-98.8 F (37.1 C)] 98.3 F (36.8 C) (08/11 0454) Pulse Rate:  [91-97] 97 (08/11 0454) Resp:  [16-17] 16 (08/11 0454) BP: (151-155)/(73-90) 151/73 (08/11 0454) SpO2:  [93 %-96 %] 94 % (08/11 0454) Last BM Date: 12/07/16  Intake/Output from previous day: 08/10 0701 - 08/11 0700 In: 1150 [P.O.:1020; I.V.:10] Out: 425 [Drains:425] Intake/Output this shift: No intake/output data recorded.  General appearance: alert, cooperative and no distress GI: soft, non distended, approp tender.     Drains murky, bilious  Incision/Wound:  No erythema or drainage, staples out  Lab Results:   Recent Labs  12/08/16 1043 12/09/16 0438  WBC 25.1* 26.1*  HGB 8.2* 8.4*  HCT 25.8* 27.0*  PLT 632* 756*   BMET  Recent Labs  12/07/16 0415 12/08/16 1043  NA 136 133*  K 3.7 3.9  CL 100* 99*  CO2 26 26  GLUCOSE 109* 115*  BUN 8 9  CREATININE 0.70 0.76  CALCIUM 8.1* 8.3*     Studies/Results: No results found.  Anti-infectives: Anti-infectives    Start     Dose/Rate Route Frequency Ordered Stop   12/08/16 0000  amoxicillin-clavulanate (AUGMENTIN) 875-125 MG tablet     1 tablet Oral Every 12 hours 12/08/16 0819     12/08/16 0000  fluconazole (DIFLUCAN) 200 MG tablet     200 mg Oral Daily 12/08/16 0819     12/07/16 1000  amoxicillin-clavulanate (AUGMENTIN) 875-125 MG per tablet 1 tablet     1 tablet Oral Every 12 hours 12/07/16 0808     12/07/16 1000  fluconazole (DIFLUCAN) tablet 200 mg     200 mg Oral Daily 12/07/16 0808     12/01/16 0900  vancomycin (VANCOCIN) 1,250 mg in sodium chloride 0.9 % 250 mL IVPB  Status:  Discontinued     1,250 mg 166.7 mL/hr over 90 Minutes Intravenous Every 8 hours 12/01/16 0821 12/07/16 0808   11/29/16 1600  vancomycin  (VANCOCIN) IVPB 1000 mg/200 mL premix  Status:  Discontinued     1,000 mg 200 mL/hr over 60 Minutes Intravenous Every 8 hours 11/29/16 0656 12/01/16 0821   11/29/16 0700  vancomycin (VANCOCIN) 2,000 mg in sodium chloride 0.9 % 500 mL IVPB     2,000 mg 250 mL/hr over 120 Minutes Intravenous  Once 11/29/16 0656 11/29/16 1015   11/28/16 1200  fluconazole (DIFLUCAN) IVPB 400 mg  Status:  Discontinued     400 mg 100 mL/hr over 120 Minutes Intravenous Every 24 hours 11/27/16 1115 12/07/16 0808   11/27/16 1200  piperacillin-tazobactam (ZOSYN) IVPB 3.375 g  Status:  Discontinued     3.375 g 12.5 mL/hr over 240 Minutes Intravenous Every 8 hours 11/27/16 1109 12/07/16 0808   11/27/16 1200  fluconazole (DIFLUCAN) IVPB 800 mg     800 mg 100 mL/hr over 240 Minutes Intravenous  Once 11/27/16 1114 11/27/16 1616   11/23/16 2000  ceFAZolin (ANCEF) IVPB 2g/100 mL premix     2 g 200 mL/hr over 30 Minutes Intravenous Every 8 hours 11/23/16 1520 11/23/16 2228   11/23/16 0600  ceFAZolin (ANCEF) IVPB 2g/100 mL premix     2 g 200 mL/hr over 30 Minutes Intravenous To [facility] 11/22/16 0814 11/23/16 1205  Assessment/Plan: s/p Procedure(s): WHIPPLE PROCEDURE Apparent leak at pancreatic and/or biliary anastomosis. Soft diet. Augmentin and oral fluconazole for 2 additional weeks.   D/c home.     LOS: 16 days    Kashden Deboy C. [id number]/74/7340

## 2016-12-09 NOTE — Progress Notes (Signed)
Pt discharged family at the bedside escorted by Nurse tech via wheelchair,  PICC line discontinued,no complain of pain.

## 2016-12-21 ENCOUNTER — Observation Stay (HOSPITAL_COMMUNITY)
Admission: EM | Admit: 2016-12-21 | Discharge: 2016-12-23 | Disposition: A | Discharge status: Home / Self Care | Payer: BC Managed Care – PPO | Attending: General Surgery | Admitting: General Surgery

## 2016-12-21 ENCOUNTER — Emergency Department (HOSPITAL_COMMUNITY): Payer: BC Managed Care – PPO

## 2016-12-21 ENCOUNTER — Encounter (HOSPITAL_COMMUNITY): Payer: Self-pay | Admitting: Emergency Medicine

## 2016-12-21 DIAGNOSIS — Y838 Other surgical procedures as the cause of abnormal reaction of the patient, or of later complication, without mention of misadventure at the time of the procedure: Secondary | ICD-10-CM | POA: Diagnosis not present

## 2016-12-21 DIAGNOSIS — G4730 Sleep apnea, unspecified: Secondary | ICD-10-CM | POA: Insufficient documentation

## 2016-12-21 DIAGNOSIS — R188 Other ascites: Secondary | ICD-10-CM

## 2016-12-21 DIAGNOSIS — R109 Unspecified abdominal pain: Secondary | ICD-10-CM

## 2016-12-21 DIAGNOSIS — Z85528 Personal history of other malignant neoplasm of kidney: Secondary | ICD-10-CM | POA: Diagnosis not present

## 2016-12-21 DIAGNOSIS — Z905 Acquired absence of kidney: Secondary | ICD-10-CM | POA: Diagnosis not present

## 2016-12-21 DIAGNOSIS — L0291 Cutaneous abscess, unspecified: Secondary | ICD-10-CM

## 2016-12-21 DIAGNOSIS — T85698A Other mechanical complication of other specified internal prosthetic devices, implants and grafts, initial encounter: Secondary | ICD-10-CM | POA: Diagnosis present

## 2016-12-21 DIAGNOSIS — F329 Major depressive disorder, single episode, unspecified: Secondary | ICD-10-CM | POA: Diagnosis not present

## 2016-12-21 DIAGNOSIS — T814XXA Infection following a procedure, initial encounter: Secondary | ICD-10-CM | POA: Diagnosis not present

## 2016-12-21 DIAGNOSIS — K651 Peritoneal abscess: Secondary | ICD-10-CM | POA: Diagnosis not present

## 2016-12-21 HISTORY — DX: Malignant neoplasm of unspecified kidney, except renal pelvis: C64.9

## 2016-12-21 HISTORY — DX: Gastro-esophageal reflux disease without esophagitis: K21.9

## 2016-12-21 LAB — CBC WITH DIFFERENTIAL/PLATELET
BASOS PCT: 0 %
Basophils Absolute: 0.1 10*3/uL (ref 0.0–0.1)
EOS ABS: 0.3 10*3/uL (ref 0.0–0.7)
Eosinophils Relative: 2 %
HEMATOCRIT: 31.4 % — AB (ref 36.0–46.0)
Hemoglobin: 10 g/dL — ABNORMAL LOW (ref 12.0–15.0)
LYMPHS ABS: 0.9 10*3/uL (ref 0.7–4.0)
Lymphocytes Relative: 5 %
MCH: 26.2 pg (ref 26.0–34.0)
MCHC: 31.8 g/dL (ref 30.0–36.0)
MCV: 82.2 fL (ref 78.0–100.0)
MONO ABS: 1.4 10*3/uL — AB (ref 0.1–1.0)
MONOS PCT: 8 %
Neutro Abs: 15.7 10*3/uL — ABNORMAL HIGH (ref 1.7–7.7)
Neutrophils Relative %: 85 %
Platelets: 764 10*3/uL — ABNORMAL HIGH (ref 150–400)
RBC: 3.82 MIL/uL — ABNORMAL LOW (ref 3.87–5.11)
RDW: 16 % — AB (ref 11.5–15.5)
WBC: 18.4 10*3/uL — ABNORMAL HIGH (ref 4.0–10.5)

## 2016-12-21 LAB — COMPREHENSIVE METABOLIC PANEL
ALK PHOS: 228 U/L — AB (ref 38–126)
ALT: 32 U/L (ref 14–54)
ANION GAP: 13 (ref 5–15)
AST: 30 U/L (ref 15–41)
Albumin: 2.6 g/dL — ABNORMAL LOW (ref 3.5–5.0)
BILIRUBIN TOTAL: 1 mg/dL (ref 0.3–1.2)
BUN: 8 mg/dL (ref 6–20)
CALCIUM: 9 mg/dL (ref 8.9–10.3)
CO2: 23 mmol/L (ref 22–32)
CREATININE: 0.88 mg/dL (ref 0.44–1.00)
Chloride: 97 mmol/L — ABNORMAL LOW (ref 101–111)
GFR calc Af Amer: >60 mL/min (ref >60)
GLUCOSE: 113 mg/dL — AB (ref 65–99)
POTASSIUM: 4.3 mmol/L (ref 3.5–5.1)
Sodium: 133 mmol/L — ABNORMAL LOW (ref 135–145)
Total Protein: 7.8 g/dL (ref 6.5–8.1)

## 2016-12-21 LAB — URINALYSIS, ROUTINE W REFLEX MICROSCOPIC
BACTERIA UA: NONE SEEN
BILIRUBIN URINE: NEGATIVE
Glucose, UA: NEGATIVE mg/dL
Hgb urine dipstick: NEGATIVE
Ketones, ur: 20 mg/dL — AB
Leukocytes, UA: NEGATIVE
NITRITE: NEGATIVE
PH: 5 (ref 5.0–8.0)
Protein, ur: 30 mg/dL — AB

## 2016-12-21 LAB — CREATININE, SERUM: Creatinine, Ser: 0.8 mg/dL (ref 0.44–1.00)

## 2016-12-21 LAB — I-STAT CG4 LACTIC ACID, ED: Lactic Acid, Venous: 1.34 mmol/L (ref 0.5–1.9)

## 2016-12-21 LAB — CBC
HEMATOCRIT: 29.5 % — AB (ref 36.0–46.0)
Hemoglobin: 9 g/dL — ABNORMAL LOW (ref 12.0–15.0)
MCH: 25.6 pg — ABNORMAL LOW (ref 26.0–34.0)
MCHC: 30.5 g/dL (ref 30.0–36.0)
MCV: 83.8 fL (ref 78.0–100.0)
Platelets: 681 10*3/uL — ABNORMAL HIGH (ref 150–400)
RBC: 3.52 MIL/uL — AB (ref 3.87–5.11)
RDW: 16.3 % — AB (ref 11.5–15.5)
WBC: 15 10*3/uL — ABNORMAL HIGH (ref 4.0–10.5)

## 2016-12-21 LAB — LIPASE, BLOOD: LIPASE: 76 U/L — AB (ref 11–51)

## 2016-12-21 MED ORDER — METHOCARBAMOL 500 MG PO TABS
500.0000 mg | ORAL_TABLET | Freq: Four times a day (QID) | ORAL | Status: DC | PRN
  Administered 2016-12-23: 500 mg via ORAL
  Filled 2016-12-21: qty 1

## 2016-12-21 MED ORDER — ONDANSETRON 4 MG PO TBDP: 4.0000 mg | ORAL_TABLET | Freq: Four times a day (QID) | ORAL | Status: DC | PRN

## 2016-12-21 MED ORDER — IOPAMIDOL (ISOVUE-300) INJECTION 61%
INTRAVENOUS | Status: AC
  Administered 2016-12-21: 100 mL
  Filled 2016-12-21: qty 100

## 2016-12-21 MED ORDER — ONDANSETRON HCL 4 MG/2ML IJ SOLN: 4.0000 mg | Freq: Four times a day (QID) | INTRAMUSCULAR | Status: DC | PRN

## 2016-12-21 MED ORDER — HYDROMORPHONE HCL 1 MG/ML IJ SOLN
1.0000 mg | Freq: Once | INTRAMUSCULAR | Status: AC
  Administered 2016-12-21: 1 mg via INTRAVENOUS
  Filled 2016-12-21: qty 1

## 2016-12-21 MED ORDER — PIPERACILLIN-TAZOBACTAM 3.375 G IVPB
3.3750 g | Freq: Three times a day (TID) | INTRAVENOUS | Status: DC
  Administered 2016-12-21 – 2016-12-23 (×5): 3.375 g via INTRAVENOUS
  Filled 2016-12-21 (×7): qty 50

## 2016-12-21 MED ORDER — CITALOPRAM HYDROBROMIDE 20 MG PO TABS
20.0000 mg | ORAL_TABLET | Freq: Every day | ORAL | Status: DC
  Administered 2016-12-23: 20 mg via ORAL
  Filled 2016-12-21 (×2): qty 1

## 2016-12-21 MED ORDER — ACETAMINOPHEN 500 MG PO TABS
1000.0000 mg | ORAL_TABLET | Freq: Four times a day (QID) | ORAL | Status: DC
  Administered 2016-12-21 – 2016-12-23 (×4): 1000 mg via ORAL
  Filled 2016-12-21 (×6): qty 2

## 2016-12-21 MED ORDER — HYDROMORPHONE HCL 1 MG/ML IJ SOLN
1.0000 mg | INTRAMUSCULAR | Status: DC | PRN
  Administered 2016-12-21 (×2): 1 mg via INTRAVENOUS
  Filled 2016-12-21 (×3): qty 1

## 2016-12-21 MED ORDER — ENSURE ENLIVE PO LIQD: 237.0000 mL | Freq: Two times a day (BID) | ORAL | Status: DC

## 2016-12-21 MED ORDER — HYDROMORPHONE HCL 1 MG/ML IJ SOLN
0.5000 mg | INTRAMUSCULAR | Status: DC | PRN
  Administered 2016-12-21 (×2): 1 mg via INTRAVENOUS
  Administered 2016-12-22 (×4): 2 mg via INTRAVENOUS
  Administered 2016-12-22 (×2): 1 mg via INTRAVENOUS
  Administered 2016-12-23 (×2): 2 mg via INTRAVENOUS
  Filled 2016-12-21: qty 2
  Filled 2016-12-21: qty 1
  Filled 2016-12-21 (×2): qty 2
  Filled 2016-12-21 (×3): qty 1
  Filled 2016-12-21 (×3): qty 2

## 2016-12-21 MED ORDER — IOPAMIDOL (ISOVUE-300) INJECTION 61%
INTRAVENOUS | Status: AC
  Filled 2016-12-21: qty 30

## 2016-12-21 MED ORDER — SIMETHICONE 80 MG PO CHEW: 40.0000 mg | CHEWABLE_TABLET | Freq: Four times a day (QID) | ORAL | Status: DC | PRN

## 2016-12-21 MED ORDER — OXYCODONE HCL 5 MG PO TABS
5.0000 mg | ORAL_TABLET | ORAL | Status: DC | PRN
  Administered 2016-12-22 – 2016-12-23 (×6): 10 mg via ORAL
  Filled 2016-12-21 (×6): qty 2

## 2016-12-21 MED ORDER — ONDANSETRON HCL 4 MG/2ML IJ SOLN
4.0000 mg | Freq: Four times a day (QID) | INTRAMUSCULAR | Status: DC
  Administered 2016-12-22 – 2016-12-23 (×4): 4 mg via INTRAVENOUS
  Filled 2016-12-21 (×4): qty 2

## 2016-12-21 MED ORDER — DIPHENHYDRAMINE HCL 50 MG/ML IJ SOLN: 12.5000 mg | Freq: Four times a day (QID) | INTRAMUSCULAR | Status: DC | PRN

## 2016-12-21 MED ORDER — KCL IN DEXTROSE-NACL 20-5-0.45 MEQ/L-%-% IV SOLN
INTRAVENOUS | Status: DC
Start: 2016-12-21 — End: 2016-12-23
  Administered 2016-12-21: 20:00:00 via INTRAVENOUS
  Administered 2016-12-22: 75 mL/h via INTRAVENOUS
  Administered 2016-12-22: 23:00:00 via INTRAVENOUS
  Filled 2016-12-21 (×3): qty 1000

## 2016-12-21 MED ORDER — DIPHENHYDRAMINE HCL 12.5 MG/5ML PO ELIX: 12.5000 mg | ORAL_SOLUTION | Freq: Four times a day (QID) | ORAL | Status: DC | PRN

## 2016-12-21 MED ORDER — ZOLPIDEM TARTRATE 5 MG PO TABS: 5.0000 mg | ORAL_TABLET | Freq: Every evening | ORAL | Status: DC | PRN

## 2016-12-21 MED ORDER — SODIUM CHLORIDE 0.9 % IV BOLUS (SEPSIS)
1000.0000 mL | Freq: Once | INTRAVENOUS | Status: AC
  Administered 2016-12-21: 1000 mL via INTRAVENOUS

## 2016-12-21 MED ORDER — ENOXAPARIN SODIUM 40 MG/0.4ML ~~LOC~~ SOLN
40.0000 mg | SUBCUTANEOUS | Status: DC
  Administered 2016-12-22: 40 mg via SUBCUTANEOUS
  Filled 2016-12-21: qty 0.4

## 2016-12-21 MED ORDER — ALUM & MAG HYDROXIDE-SIMETH 200-200-20 MG/5ML PO SUSP: 15.0000 mL | Freq: Four times a day (QID) | ORAL | Status: DC | PRN

## 2016-12-21 NOTE — Consult Note (Addendum)
[facility] Surgery Consult Note  CRYSTALANN KORF 06-Oct-1970  [id number].    Requesting MD: Dalene Seltzer, MD Chief Complaint/Reason for Consult: post-operative abdominal pain HPI:  Krystal Reynolds is a 46 y.o. Female 4 weeks s/p Whipple procedure by Dr. Almond Lint for a large cystic mass of the pancreas. She presented to the ED today for evaluation of accidental removal of a percutaneous abdominal drain. Patient reports waking up around 2:00 AM with increased abdominal pain and the drain out of her abdomen. She reports worse abdominal pain the past 24 hours but states that she also tried to control her pain with ibuprofen and abruptly stopped her oxycodone (last dose 8/22 at 0700) because it makes her "loopy" (previously she required 10-15 mg q 3-4 h to control her post-operative pain). Pain described as constant, non-radiating. Relieved by ibuprofen/oxycodone. No aggravating factors. Associated sxs include one episode diarrhea last night, more watery than previously. She denies fever, chills, SOB, worsening nausea, or vomiting. She has been eating 3 very small meals daily with occasional fruit in between. She is currently on oral Augmentin/Fluconazole.   Patient saw Dr. Donell Beers in the office this past week where she had one perc drain removed and one left in place due to concerns for possible pancreatic and/or biliary anastomotic leak. The drain has been putting out 70-95 cc daily of think, gray/tan drainage.  ED workup: WBC 18 (from 26 2 weeks ago), mild hyponatremia (133), albumin 2.6 from 2.1, normal lactic acid. CT abd/pelv pending.  ROS: Review of Systems  Constitutional: Negative for chills and fever.  Gastrointestinal: Positive for abdominal pain and diarrhea. Negative for blood in stool, constipation, melena and vomiting.    Family History  Problem Relation Age of Onset  . Cancer Paternal Uncle        lung  . AAA (abdominal aortic aneurysm) Maternal Grandfather        60s-70s     Past Medical History:  Diagnosis Date  . Anemia 01-02-12   mostly low iron- tx. oral meds, not in several yrs  . 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 Chronic kidney disease 01-02-12   dx. rt. kidney neoplasm-surgery planned, past hx. protein in urine.  . Depression   . Family history of anesthesia complication    mother slow to wake up   . Leg swelling 01-02-12   occ. episodes of pitting edema lower extremities at end of day.  . Preeclampsia    '98  . Pregnancy induced hypertension   . Sleep apnea 01-02-12   does not use cpap  lost wt  . Thyroid enlargement 01-02-12   left thyroid enlargement, being watched, no tx.    Past Surgical History:  Procedure Laterality Date  . ABDOMINAL HYSTERECTOMY  2008  . BREATH TEK H PYLORI  09/20/2011   Procedure: BREATH TEK H PYLORI;  Surgeon: Kandis Cocking, MD;  Location: [facility];  Service: General;  Laterality: N/A;  . childbirth  01-02-12   x2 NVD- pre-exclampsia with pregrancy  . EUS  12/07/2011   Procedure: UPPER ENDOSCOPIC ULTRASOUND (EUS) LINEAR;  Surgeon: Rachael Fee, MD;  Location: [facility];  Service: Endoscopy;  Laterality: N/A;  radial linear  . right partial nephrectomy   13,15   also left partial nefhrectomy 15-20%  . ROBOTIC ASSITED PARTIAL NEPHRECTOMY Left 10/02/2013   Procedure: ROBOTIC ASSITED PARTIAL NEPHRECTOMY;  Surgeon: Heloise Purpura, MD;  Location: [facility];  Service: Urology;  Laterality: Left;  . WHIPPLE PROCEDURE N/A 11/23/2016   Procedure: WHIPPLE PROCEDURE;  Surgeon: Stark Klein, MD;  Location: [facility];  Service: General;  Laterality: N/A;    Social History:  reports that she has never smoked. She has never used smokeless tobacco. She reports that she drinks alcohol. She reports that she does not use drugs.  Allergies: No Known Allergies   (Not in a hospital admission)  Blood pressure 129/69, pulse 70, temperature (!) 97.4 F (36.3 C), temperature  source Oral, resp. rate (!) 24, height '5\' 7"'$  (1.702 m), weight 90.7 kg (200 lb), SpO2 93 %. Physical Exam: Physical Exam  Results for orders placed or performed during the hospital encounter of 12/21/16 (from the past 48 hour(s))  Comprehensive metabolic panel     Status: Abnormal   Collection Time: 12/21/16  7:37 AM  Result Value Ref Range   Sodium 133 (L) 135 - 145 mmol/L   Potassium 4.3 3.5 - 5.1 mmol/L   Chloride 97 (L) 101 - 111 mmol/L   CO2 23 22 - 32 mmol/L   Glucose, Bld 113 (H) 65 - 99 mg/dL   BUN 8 6 - 20 mg/dL   Creatinine, Ser 0.88 0.44 - 1.00 mg/dL   Calcium 9.0 8.9 - 10.3 mg/dL   Total Protein 7.8 6.5 - 8.1 g/dL   Albumin 2.6 (L) 3.5 - 5.0 g/dL   AST 30 15 - 41 U/L   ALT 32 14 - 54 U/L   Alkaline Phosphatase 228 (H) 38 - 126 U/L   Total Bilirubin 1.0 0.3 - 1.2 mg/dL   GFR calc non Af Amer >60 >60 mL/min   GFR calc Af Amer >60 >60 mL/min    Comment: (NOTE) The eGFR has been calculated using the CKD EPI equation. This calculation has not been validated in all clinical situations. eGFR's persistently <60 mL/min signify possible Chronic Kidney Disease.    Anion gap 13 5 - 15  CBC with Differential     Status: Abnormal   Collection Time: 12/21/16  7:37 AM  Result Value Ref Range   WBC 18.4 (H) 4.0 - 10.5 K/uL   RBC 3.82 (L) 3.87 - 5.11 MIL/uL   Hemoglobin 10.0 (L) 12.0 - 15.0 g/dL   HCT 31.4 (L) 36.0 - 46.0 %   MCV 82.2 78.0 - 100.0 fL   MCH 26.2 26.0 - 34.0 pg   MCHC 31.8 30.0 - 36.0 g/dL   RDW 16.0 (H) 11.5 - 15.5 %   Platelets 764 (H) 150 - 400 K/uL   Neutrophils Relative % 85 %   Neutro Abs 15.7 (H) 1.7 - 7.7 K/uL   Lymphocytes Relative 5 %   Lymphs Abs 0.9 0.7 - 4.0 K/uL   Monocytes Relative 8 %   Monocytes Absolute 1.4 (H) 0.1 - 1.0 K/uL   Eosinophils Relative 2 %   Eosinophils Absolute 0.3 0.0 - 0.7 K/uL   Basophils Relative 0 %   Basophils Absolute 0.1 0.0 - 0.1 K/uL  Lipase, blood     Status: Abnormal   Collection Time: 12/21/16  7:37 AM   Result Value Ref Range   Lipase 76 (H) 11 - 51 U/L  I-Stat CG4 Lactic Acid, ED     Status: None   Collection Time: 12/21/16  8:02 AM  Result Value Ref Range   Lactic Acid, Venous 1.34 0.5 - 1.9 mmol/L   No results found.  Assessment/Plan Intra-abdominal fluid collections  S/P WHIPPLE PROCEDURE 11/23/2016 Dr. Stark Klein - discharged  8/11 w/ RUQ drain in place for possible pancreatic/biliary leak. Drain accidentally pulled out 8/23. - repeat CT abd/pelv 8/23 shows increase in perihepatic and midline upper abdominal fluid collections.  - IR consult for percutaneous drain placement. If able to place drain, admit patient for drain placement and pain control with anticipated discharge in 24-48 hours.  - patient otherwise stable, pain controlled on current medication regimen, nausea controlled, tolerating PO, afebrile, leukocytosis improved compared to previous admission.   Will review scans with MD and drainage with IR.      Jill Alexanders, [facility] Surgery 12/21/2016, 12:45 PM Pager: [phone] Consults: [phone] Mon-Fri 7:00 am-4:30 pm Sat-Sun 7:00 am-11:30 am   Agree with above.  Reviewed with IR.  They will attempt to aspirate/drain the upper abdominal fluid collection.   Will admit with antibiotics.

## 2016-12-21 NOTE — ED Triage Notes (Signed)
Pt states "I had a wipple procedure four weeks ago today, I had two drains placed in my belly. About 2am this morning one of my drains got pulled out". Pt c/o abdominal pain, back pain, side pain.

## 2016-12-22 ENCOUNTER — Observation Stay (HOSPITAL_COMMUNITY): Payer: BC Managed Care – PPO

## 2016-12-22 LAB — COMPREHENSIVE METABOLIC PANEL
ALK PHOS: 190 U/L — AB (ref 38–126)
ALT: 24 U/L (ref 14–54)
AST: 27 U/L (ref 15–41)
Albumin: 2.3 g/dL — ABNORMAL LOW (ref 3.5–5.0)
Anion gap: 12 (ref 5–15)
BUN: 6 mg/dL (ref 6–20)
CALCIUM: 8.8 mg/dL — AB (ref 8.9–10.3)
CHLORIDE: 98 mmol/L — AB (ref 101–111)
CO2: 25 mmol/L (ref 22–32)
CREATININE: 0.76 mg/dL (ref 0.44–1.00)
GFR calc Af Amer: >60 mL/min (ref >60)
GFR calc non Af Amer: >60 mL/min (ref >60)
Glucose, Bld: 117 mg/dL — ABNORMAL HIGH (ref 65–99)
Potassium: 4 mmol/L (ref 3.5–5.1)
SODIUM: 135 mmol/L (ref 135–145)
Total Bilirubin: 0.5 mg/dL (ref 0.3–1.2)
Total Protein: 7.3 g/dL (ref 6.5–8.1)

## 2016-12-22 LAB — PROTIME-INR
INR: 1.23
Prothrombin Time: 15.6 s — ABNORMAL HIGH (ref 11.4–15.2)

## 2016-12-22 LAB — CBC
HCT: 29.7 % — ABNORMAL LOW (ref 36.0–46.0)
Hemoglobin: 9.3 g/dL — ABNORMAL LOW (ref 12.0–15.0)
MCH: 26.4 pg (ref 26.0–34.0)
MCHC: 31.3 g/dL (ref 30.0–36.0)
MCV: 84.4 fL (ref 78.0–100.0)
PLATELETS: 665 10*3/uL — AB (ref 150–400)
RBC: 3.52 MIL/uL — ABNORMAL LOW (ref 3.87–5.11)
RDW: 16.8 % — AB (ref 11.5–15.5)
WBC: 13.7 10*3/uL — ABNORMAL HIGH (ref 4.0–10.5)

## 2016-12-22 LAB — PHOSPHORUS: Phosphorus: 4.2 mg/dL (ref 2.5–4.6)

## 2016-12-22 LAB — APTT: aPTT: 32 s (ref 24–36)

## 2016-12-22 LAB — HIV ANTIBODY (ROUTINE TESTING W REFLEX): HIV SCREEN 4TH GENERATION: NONREACTIVE

## 2016-12-22 LAB — MAGNESIUM: Magnesium: 1.9 mg/dL (ref 1.7–2.4)

## 2016-12-22 MED ORDER — MIDAZOLAM HCL 2 MG/2ML IJ SOLN
INTRAMUSCULAR | Status: AC | PRN
  Administered 2016-12-22 (×2): 1 mg via INTRAVENOUS

## 2016-12-22 MED ORDER — LIDOCAINE HCL (PF) 1 % IJ SOLN
INTRAMUSCULAR | Status: AC
  Filled 2016-12-22: qty 30

## 2016-12-22 MED ORDER — FENTANYL CITRATE (PF) 100 MCG/2ML IJ SOLN
INTRAMUSCULAR | Status: AC | PRN
  Administered 2016-12-22: 50 ug via INTRAVENOUS
  Administered 2016-12-22 (×2): 25 ug via INTRAVENOUS
  Administered 2016-12-22: 50 ug via INTRAVENOUS
  Administered 2016-12-22 (×2): 25 ug via INTRAVENOUS

## 2016-12-22 MED ORDER — FENTANYL CITRATE (PF) 100 MCG/2ML IJ SOLN
INTRAMUSCULAR | Status: AC
Start: 2016-12-22 — End: 2016-12-23
  Filled 2016-12-22: qty 4

## 2016-12-22 MED ORDER — MIDAZOLAM HCL 2 MG/2ML IJ SOLN
INTRAMUSCULAR | Status: AC
  Filled 2016-12-22: qty 4

## 2016-12-22 NOTE — Progress Notes (Signed)
Subjective/Chief Complaint: Feels "blah."  Having upper abdominal pain.     Objective: Vital signs in last 24 hours: Temp:  [97.6 F (36.4 C)-98.5 F (36.9 C)] 97.6 F (36.4 C) (08/24 0550) Pulse Rate:  [65-81] 70 (08/24 0550) Resp:  [16-27] 20 (08/24 0550) BP: (115-144)/(53-76) 140/69 (08/24 0550) SpO2:  [95 %-100 %] 96 % (08/24 0550) Last BM Date: 12/21/16  Intake/Output from previous day: 08/23 0701 - 08/24 0700 In: 960 [P.O.:360; I.V.:600] Out: -  Intake/Output this shift: No intake/output data recorded.  General appearance: alert, cooperative, mild distress, looks weak.   Resp: breathing comfortably GI: soft, non distended, mild lower midline tenderness toward pelvis.  murky drainage on lower abdominal dressings.  midline with a small protuberant area that opened and drained. Extremities: extremities normal, atraumatic, no cyanosis or edema  Lab Results:   Recent Labs  12/21/16 1936 12/22/16 0502  WBC 15.0* 13.7*  HGB 9.0* 9.3*  HCT 29.5* 29.7*  PLT 681* 665*   BMET  Recent Labs  12/21/16 0737 12/21/16 1936 12/22/16 0502  NA 133*  --  135  K 4.3  --  4.0  CL 97*  --  98*  CO2 23  --  25  GLUCOSE 113*  --  117*  BUN 8  --  6  CREATININE 0.88 0.80 0.76  CALCIUM 9.0  --  8.8*   PT/INR  Recent Labs  12/22/16 0850  LABPROT 15.6*  INR 1.23   ABG No results for input(s): PHART, HCO3 in the last 72 hours.  Invalid input(s): PCO2, PO2  Studies/Results: Ct Abdomen Pelvis W Contrast  Result Date: 12/21/2016 CLINICAL DATA:  46 y/o F; accidental removal of percutaneous abdominal drains. Possible bile leak post Whipple. EXAM: CT ABDOMEN AND PELVIS WITH CONTRAST TECHNIQUE: Multidetector CT imaging of the abdomen and pelvis was performed using the standard protocol following bolus administration of intravenous contrast. CONTRAST:  158mL ISOVUE-300 IOPAMIDOL (ISOVUE-300) INJECTION 61% COMPARISON:  12/01/2016 CT abdomen and pelvis. FINDINGS: Lower  chest: Small left-sided pleural effusion is increased in size. Lower lobe platelike atelectasis. Hepatobiliary: No focal liver abnormality is seen. Status post cholecystectomy. No biliary dilatation. Pancreas: Status post Whipple. No main duct dilatation of residual increased. Spleen: Normal in size without focal abnormality. Adrenals/Urinary Tract: Subcentimeter cysts within the left kidney upper pole. Otherwise no focal kidney lesion. No hydronephrosis. Normal bladder. Stomach/Bowel: Status post Whipple. No evidence for obstructive change of the biliary or alimentary limbs. Left-sided percutaneous nephrostomy tube is stable. Interval removal of 2 right upper quadrant drains. Vascular/Lymphatic: No significant vascular findings are present. No enlarged abdominal or pelvic lymph nodes. Reproductive: Status post hysterectomy. No adnexal masses. Other: Fluid collection inferior to left lobe of liver measures 5.7 x 6.7 x 2.6 cm (AP x ML x CC series 3, image 32 and series 6, image 48) and demonstrates mild peripheral enhancement. The collection connects to an additional group of collections within the midline upper abdomen by small tract of fluid right right anterior to the head of pancreas (series 3, image 35 and series 6, image 38). In comparison with the prior study these fluid collections are mildly increased in size and increased in organization demonstrating thin rims of enhancement. Interval decrease in lower abdominal ascites. Musculoskeletal: No acute or significant osseous findings. IMPRESSION: 1. Mild increase in size and increased organization within rim enhancement of fluid collections inferior to the left lobe of liver, tracking anterior to pancreas, and within the midline upper anterior mesentery. Rim enhancement may  represent infection or inflammation. 2. Decreased ascites. 3. Increased small left pleural effusion. Platelike atelectasis in lung bases. 4. Status post Whipple. No obstruction of biliary or  alimentary limbs. Electronically Signed   By: Kristine Garbe M.D.   On: 12/21/2016 15:41    Anti-infectives: Anti-infectives    Start     Dose/Rate Route Frequency Ordered Stop   12/21/16 2000  piperacillin-tazobactam (ZOSYN) IVPB 3.375 g     3.375 g 12.5 mL/hr over 240 Minutes Intravenous Every 8 hours 12/21/16 1850        Assessment/Plan: s/p * No surgery found * s/p whipple with post op infection.  Drain came out at home. IR to attempt drainage here.   IV antibiotics WBCs coming down. IV fluids Probably home tomorrow. Dressings to midline and former drain sites.    LOS: 0 days    [facility] 12/22/2016

## 2016-12-22 NOTE — Sedation Documentation (Signed)
Successful placement of drain in pt's abd.  Purulent drainage coming out into JP drain.

## 2016-12-22 NOTE — Procedures (Signed)
CT guided placement of two drains.  Placed Drain #1 just inferior to left hepatic lobe.  Placed Drain #2 in anterior abdominal cavity. Yellow/brown purulent fluid removed from both.  40 ml removed from Drain #1 and 10 ml removed from Drain #2.  Minimal blood loss and no immediate complication.

## 2016-12-22 NOTE — H&P (Signed)
See consultation note from same date.  [place], cosigned by myself.

## 2016-12-22 NOTE — Sedation Documentation (Signed)
Patient is resting comfortably. 

## 2016-12-22 NOTE — Progress Notes (Signed)
Initial Nutrition Assessment  DOCUMENTATION CODES:   Obesity unspecified  INTERVENTION:   -RD will follow for diet advancement and supplement as appropriate  NUTRITION DIAGNOSIS:   Increased nutrient needs related to  (post-op healing) as evidenced by estimated needs.  GOAL:   Patient will meet greater than or equal to 90% of their needs  MONITOR:   Diet advancement, Labs, Weight trends, Skin, I & O's  REASON FOR ASSESSMENT:   Malnutrition Screening Tool    ASSESSMENT:   Ms. Bridge is a 46 y.o. Female 4 weeks s/p Whipple procedure by Dr. Stark Klein for a large cystic mass of the pancreas. She presented to the ED today for evaluation of accidental removal of a percutaneous abdominal drain.  8/23- accidental drain removal, CT revealed increased size of collection  Per IR note, pt is scheduled for aspiration/drain placement.   Spoke with pt, who reports her appetite was improving PTA. Pt was consuming 3 small meals daily; diet consisted mainly of soft foods such as mashed potatoes, vegetables, and chicken pot pie. She was also consuming a lot of water.   Pt reveals a 20# wt loss over the past month, which she anticipated due to surgery. Noted a 22# (9.9%) wt loss over the past month. Pt confirms fluid overload during last admission and feels like some weight loss is related to this.   Nutrition-Focused physical exam completed. Findings are no fat depletion, no muscle depletion, and no edema.   Pt eager to eat, but understanding of NPO order. Discussed importance of good meal completion once diet is advanced.   Labs reviewed.   Diet Order:  Diet NPO time specified  Skin:   (closed abdominal incision)  Last BM:  12/21/16  Height:   Ht Readings from Last 1 Encounters:  12/21/16 5\' 7"  (1.702 m)    Weight:   Wt Readings from Last 1 Encounters:  12/21/16 200 lb (90.7 kg)    Ideal Body Weight:  61.4 kg  BMI:  Body mass index is 31.32 kg/m.  Estimated  Nutritional Needs:   Kcal:  1850-2050  Protein:  110-125 grams  Fluid:  1.8-2.0 L  EDUCATION NEEDS:   Education needs addressed  Jaylenn Altier A. Jimmye Norman, RD, LDN, CDE Pager: [phone] After hours Pager: [phone]

## 2016-12-22 NOTE — Progress Notes (Signed)
1555pm Received post drain placement patient complaining to pain on her abdomen, moaning. Drain #1  Noted to have purulent pus drainage and drain, #2  Dark bloody drainage noted.PRN meds given.will monitor.

## 2016-12-22 NOTE — Consult Note (Signed)
Chief Complaint: Patient was seen in consultation today for abdominal abscess aspiration/drain placement Chief Complaint  Patient presents with  . Abdominal Pain   at the request of Dr Mamie Laurel  Referring Physician(s): Dr Barry Dienes  Supervising Physician: Markus Daft  Patient Status: [facility] - In-pt  History of Present Illness: Krystal Reynolds is a 46 y.o. 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 Mild increase in size and increased organization within rim enhancement of fluid collections inferior to the left lobe of liver, tracking anterior to pancreas, and within the midline upper anterior mesentery. Rim enhancement may represent infection or inflammation. 2. Decreased ascites. 3. Increased small left pleural effusion. Platelike atelectasis in lung bases. 4. Status post Whipple. No obstruction of biliary or alimentary Limbs.  Request for drain placement per Dr Barry Dienes Dr Anselm Pancoast has reviewed imaging and approves procedure   Past Medical History:  Diagnosis Date  . Anemia 01-02-12   mostly low iron- tx. oral meds, not in several yrs  . Chronic kidney disease 01-02-12   dx. rt. kidney neoplasm-surgery planned, past hx. protein in urine.  . 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 Depression   . Family history of anesthesia complication    mother slow to wake up   . GERD (gastroesophageal reflux disease)   . Leg swelling 01-02-12   occ. episodes of pitting edema lower extremities at end of day.  . Preeclampsia 1998  . Pregnancy induced hypertension 1998  . Sleep apnea    "dx'd; used mask for ~ 1 yr; lost weight; no longer wear mask" (12/21/2016)  . Thyroid enlargement 01/02/2012   left thyroid  enlargement, being watched, S/P [place]; no tx.    Past Surgical History:  Procedure Laterality Date  . BREATH TEK H PYLORI  09/20/2011   Procedure: BREATH TEK H PYLORI;  Surgeon: Shann Medal, MD;  Location: [facility];  Service: General;  Laterality: N/A;  . childbirth  01-02-12   x2 NVD- pre-exclampsia with pregrancy  . CHOLECYSTECTOMY  11/23/2016   'w/whipple"  . EUS  12/07/2011   Procedure: UPPER ENDOSCOPIC ULTRASOUND (EUS) LINEAR;  Surgeon: Milus Banister, MD;  Location: [facility];  Service: Endoscopy;  Laterality: N/A;  radial linear  . ROBOTIC ASSITED PARTIAL NEPHRECTOMY Left 10/02/2013   Procedure: ROBOTIC ASSITED PARTIAL NEPHRECTOMY;  Surgeon: Raynelle Bring, MD;  Location: [facility];  Service: Urology;  Laterality: Left;  . ROBOTIC ASSITED PARTIAL NEPHRECTOMY Right 12/2011  . VAGINAL HYSTERECTOMY  2008  . WHIPPLE PROCEDURE N/A 11/23/2016   Procedure: WHIPPLE PROCEDURE;  Surgeon: Stark Klein, MD;  Location: [place];  Service: General;  Laterality: N/A;    Allergies: Patient has no known allergies.  Medications: Prior to Admission medications   Medication Sig Start Date End Date Taking? Authorizing Provider  alum & mag hydroxide-simeth (MAALOX/MYLANTA) 200-200-20 MG/5ML suspension Take 15 mLs by mouth every 6 (six) hours as needed for indigestion or heartburn.   Yes [provider]  amoxicillin-clavulanate (AUGMENTIN) 875-125 MG tablet Take 1 tablet by mouth every 12 (twelve) hours. 12/08/16  Yes Stark Klein, MD  citalopram (CELEXA) 20 MG tablet Take 20 mg by mouth daily with breakfast.    Yes [provider]  fluconazole (DIFLUCAN)  200 MG tablet Take 1 tablet (200 mg total) by mouth daily. 12/08/16  Yes Stark Klein, MD  ibuprofen (ADVIL,MOTRIN) 200 MG tablet Take 400-600 mg by mouth every 8 (eight) hours as needed for headache or mild pain.   Yes [provider]  methocarbamol (ROBAXIN) 500 MG tablet Take 1 tablet (500 mg total) by mouth every 6 (six)  hours as needed for muscle spasms. 12/08/16  Yes Stark Klein, MD  oxyCODONE (OXY IR/ROXICODONE) 5 MG immediate release tablet Take 1-3 tablets (5-15 mg total) by mouth every 2 (two) hours as needed for moderate pain, severe pain or breakthrough pain. Patient taking differently: Take 2-10 mg by mouth every three (3) days as needed for moderate pain, severe pain or breakthrough pain.  12/08/16  Yes Stark Klein, MD     Family History  Problem Relation Age of Onset  . Cancer Paternal Uncle        lung  . AAA (abdominal aortic aneurysm) Maternal Grandfather        60s-70s    Social History   Social History  . Marital status: Married    Spouse name: N/A  . Number of children: 2  . 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 Smoking status: Never Smoker  . Smokeless tobacco: Never Used  . Alcohol use Yes     Comment: 12/21/2016 "beer twice/month; if that"  . Drug use: No  . Sexual activity: Not Currently   Other Topics Concern  . None   Social History Narrative  . None    Review of Systems: A 12 point ROS discussed and pertinent positives are indicated in the HPI above.  All other systems are negative.  Review of Systems  Constitutional: Positive for activity change, appetite change and fatigue. Negative for fever.  Respiratory: Negative for cough and shortness of breath.   Cardiovascular: Negative for chest pain.  Gastrointestinal: Positive for abdominal pain and nausea.  Musculoskeletal: Negative for back pain.  Neurological: Positive for weakness.  Psychiatric/Behavioral: Negative for behavioral problems and confusion.    Vital Signs: BP 140/69 (BP Location: Right Arm)   Pulse 70   Temp 97.6 F (36.4 C) (Oral)   Resp 20   Ht 5\' 7"  (1.702 m)   Wt 200 lb (90.7 kg)   SpO2 96%   BMI 31.32 kg/m   Physical Exam  Constitutional: She is oriented to person, place, and time.  Cardiovascular:  Normal rate, regular rhythm and normal heart sounds.   Pulmonary/Chest: Effort normal and breath sounds normal.  Abdominal: Soft. Bowel sounds are normal. There is tenderness.  Musculoskeletal: Normal range of motion.  Neurological: She is alert and oriented to person, place, and time.  Skin: Skin is warm and dry.  Psychiatric: She has a normal mood and affect. Her behavior is normal. Judgment and thought content normal.  Nursing note and vitals reviewed.   Mallampati Score:  MD Evaluation Airway: WNL Heart: WNL Abdomen: WNL Chest/ Lungs: WNL ASA  Classification: 3 Mallampati/Airway Score: One  Imaging: Ct Abdomen Pelvis W Contrast  Result Date: 12/21/2016 CLINICAL DATA:  46 y/o F; accidental removal of percutaneous abdominal drains. Possible bile leak post Whipple. EXAM: CT ABDOMEN AND PELVIS WITH CONTRAST TECHNIQUE: Multidetector CT imaging of the abdomen and pelvis was performed using the standard protocol following bolus administration of intravenous contrast. CONTRAST:  118mL ISOVUE-300 IOPAMIDOL (ISOVUE-300) INJECTION 61% COMPARISON:  12/01/2016 CT abdomen  and pelvis. FINDINGS: Lower chest: Small left-sided pleural effusion is increased in size. Lower lobe platelike atelectasis. Hepatobiliary: No focal liver abnormality is seen. Status post cholecystectomy. No biliary dilatation. Pancreas: Status post Whipple. No main duct dilatation of residual increased. Spleen: Normal in size without focal abnormality. Adrenals/Urinary Tract: Subcentimeter cysts within the left kidney upper pole. Otherwise no focal kidney lesion. No hydronephrosis. Normal bladder. Stomach/Bowel: Status post Whipple. No evidence for obstructive change of the biliary or alimentary limbs. Left-sided percutaneous nephrostomy tube is stable. Interval removal of 2 right upper quadrant drains. Vascular/Lymphatic: No significant vascular findings are present. No enlarged abdominal or pelvic lymph nodes. Reproductive: Status  post hysterectomy. No adnexal masses. Other: Fluid collection inferior to left lobe of liver measures 5.7 x 6.7 x 2.6 cm (AP x ML x CC series 3, image 32 and series 6, image 48) and demonstrates mild peripheral enhancement. The collection connects to an additional group of collections within the midline upper abdomen by small tract of fluid right right anterior to the head of pancreas (series 3, image 35 and series 6, image 38). In comparison with the prior study these fluid collections are mildly increased in size and increased in organization demonstrating thin rims of enhancement. Interval decrease in lower abdominal ascites. Musculoskeletal: No acute or significant osseous findings. IMPRESSION: 1. Mild increase in size and increased organization within rim enhancement of fluid collections inferior to the left lobe of liver, tracking anterior to pancreas, and within the midline upper anterior mesentery. Rim enhancement may represent infection or inflammation. 2. Decreased ascites. 3. Increased small left pleural effusion. Platelike atelectasis in lung bases. 4. Status post Whipple. No obstruction of biliary or alimentary limbs. Electronically Signed   By: Kristine Garbe M.D.   On: 12/21/2016 15:41   Ct Abdomen Pelvis W Contrast  Result Date: 12/01/2016 CLINICAL DATA:  Abdominal pain and low gray fever. Whipple last Thursday. EXAM: CT ABDOMEN AND PELVIS WITH CONTRAST TECHNIQUE: Multidetector CT imaging of the abdomen and pelvis was performed using the standard protocol following bolus administration of intravenous contrast. CONTRAST:  144mL ISOVUE-300 IOPAMIDOL (ISOVUE-300) INJECTION 61% COMPARISON:  01/14/2016 FINDINGS: Lower chest: Small nearly moderate pleural effusion on the left that is layering. Multi segment atelectasis, especially in the left lower lobe. Hepatobiliary: No focal liver abnormality.There is a feeding tube within the biliary system traversing the choledochal jejunostomy. No ductal  dilatation. Pancreas: Pancreatic head mass resection. There is a catheter in the proximal small bowel which is presumably the pancreatic duct stent that has migrated. There is edema around the pancreas without organized collection, expected after surgery. Narrowing of the SMV in the operative region without thrombosis or occlusion. Drains in place without neighboring fluid collection. Spleen: Unremarkable. Adrenals/Urinary Tract: Negative adrenals. Partial nephrectomies in the left upper pole and right lower pole. Unremarkable bladder. Stomach/Bowel: Postoperative bowel. Oral contrast extends into the biliary limb without extravasation. No ileus or obstruction. A jejunostomy is in place. Vascular/Lymphatic: Postoperative area narrowing of the SMV without thrombosis or occlusion. Negative arterial structures. No mass or adenopathy. Reproductive:Hysterectomy. Other: Small volume ascites mainly in the pelvis, non loculated. Anasarca Musculoskeletal: No acute abnormalities. IMPRESSION: 1. Status post Whipple without drainable collection. Small ascites, non loculated in the pelvis. 2. Oral contrast refluxed into the biliary limb with no extravasation. 3. The biliary stent is in good position. No duct dilatation. The pancreatic stent has migrated into the duodenum. 4. Multi segment atelectasis and small borderline moderate left pleural effusion. 5. Moderate SMV narrowing in  the operative region, likely exacerbated by postop edema. 6. Anasarca. 7. Jejunostomy in good position. Electronically Signed   By: Monte Fantasia M.D.   On: 12/01/2016 13:39   US Abdomen Limited  Result Date: 12/02/2016 CLINICAL DATA:  Evaluate for ascites and possible paracentesis. EXAM: LIMITED ABDOMEN ULTRASOUND FOR ASCITES TECHNIQUE: Limited ultrasound survey for ascites was performed in all four abdominal quadrants. COMPARISON:  CT 12/01/2016 FINDINGS: There is minimal ascites in the right lower quadrant and similar to the recent CT. 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 No significant fluid around the liver. IMPRESSION: Minimal ascites, most in the right lower quadrant. Not enough fluid for a paracentesis. Electronically Signed   By: Markus Daft M.D.   On: 12/02/2016 15:23   Dg Chest Port 1 View  Result Date: 12/04/2016 CLINICAL DATA:  Short of breath EXAM: PORTABLE CHEST 1 VIEW COMPARISON:  11/28/2016 FINDINGS: LEFT PICC line with tip in distal SVC. Stable cardiac silhouette. There bilateral pleural effusions and basilar atelectasis. No upper lobe edema. IMPRESSION: 1. New PICC line with tip in distal SVC 2. Increasing bilateral pleural effusions and basilar atelectasis. Electronically Signed   By: Suzy Bouchard M.D.   On: 12/04/2016 10:33   Dg Chest Port 1 View  Result Date: 11/28/2016 CLINICAL DATA:  Shortness of Breath EXAM: PORTABLE CHEST 1 VIEW COMPARISON:  01/14/2016 FINDINGS: Low lung volumes with bibasilar atelectasis. Heart is normal size. Cannot exclude small effusions. No acute bony abnormality. IMPRESSION: Low volumes with bibasilar atelectasis.  Question small effusions. Electronically Signed   By: Rolm Baptise M.D.   On: 11/28/2016 11:26    Labs:  CBC:  Recent Labs  12/09/16 0438 12/21/16 0737 12/21/16 1936 12/22/16 0502  WBC 26.1* 18.4* 15.0* 13.7*  HGB 8.4* 10.0* 9.0* 9.3*  HCT 27.0* 31.4* 29.5* 29.7*  PLT 756* 764* 681* 665*    COAGS:  Recent Labs  11/15/16 1141 11/24/16 0531  INR 0.97 1.09    BMP:  Recent Labs  12/07/16 0415 12/08/16 1043 12/21/16 0737 12/21/16 1936 12/22/16 0502  NA 136 133* 133*  --  135  K 3.7 3.9 4.3  --  4.0  CL 100* 99* 97*  --  98*  CO2 26 26 23   --  25  GLUCOSE 109* 115* 113*  --  117*  BUN 8 9 8   --  6  CALCIUM 8.1* 8.3* 9.0  --  8.8*  CREATININE 0.70 0.76 0.88 0.80 0.76  GFRNONAA >60 >60 >60 >60 >60  GFRAA >60 >60 >60 >60 >60    LIVER FUNCTION TESTS:  Recent Labs  12/07/16 0415 12/08/16 1043 12/21/16 0737 12/22/16 0502  BILITOT 1.2 1.0  1.0 0.5  AST 36 26 30 27   ALT 40 32 32 24  ALKPHOS 397* 338* 228* 190*  PROT 6.4* 6.4* 7.8 7.3  ALBUMIN 2.1* 2.1* 2.6* 2.3*    TUMOR MARKERS: No results for input(s): AFPTM, CEA, CA199, CHROMGRNA in the last 8760 hours.  Assessment and Plan:  Post Whipple surgery 11/23/2016 Accidental post op drain removal 8/23 CT shows increase size in collection  Scheduled now for aspiration/drain placement Risks and benefits discussed with the patient including bleeding, infection, damage to adjacent structures, bowel perforation/fistula connection, and sepsis. All of the patient's questions were answered, patient is agreeable to proceed. Consent signed and in chart.  Thank you for this interesting consult.  I greatly enjoyed meeting DESERAE JENNINGS and look forward to participating in their care.  A copy of  this report was sent to the requesting provider on this date.  Electronically Signed: Lavonia Drafts, PA-C 12/22/2016, 8:41 AM   I spent a total of 40 Minutes    in face to face in clinical consultation, greater than 50% of which was counseling/coordinating care for abd collection aspiration/drain

## 2016-12-23 MED ORDER — OXYCODONE HCL 5 MG PO TABS: 5.0000 mg | ORAL_TABLET | ORAL | 0 refills | Status: DC | PRN

## 2016-12-23 MED ORDER — SODIUM CHLORIDE 0.9% FLUSH
5.0000 mL | Freq: Three times a day (TID) | INTRAVENOUS | Status: DC
  Administered 2016-12-23: 5 mL via INTRAVENOUS

## 2016-12-23 NOTE — Discharge Summary (Signed)
Patient ID: Krystal Reynolds [id number] 46 y.o. Sep 23, 1970  Admit date: 12/21/2016  Discharge date and time: 12/23/2016  Admitting Physician: Stark Klein  Discharge Physician: Adin Hector  Admission Diagnoses: Abdominal fluid collection [R18.8]  Discharge Diagnoses: abdominal abscess                                         Serous cystadenoma of the pancreas                                           History Whipple procedure 11/23/2016  Operations: CT-guided drainage of abdominal abscess.  Drain #1 inferior to left hepatic lobe.  Drain #2 anterior abdominal cavity - 12/22/2016  Admission Condition: fair  Discharged Condition: good  Indication for Admission: [name] is a 46 y.o. Female 4 weeks s/p Whipple procedure by Dr. Stark Klein for a large serous cystadenoma of the pancreas. She presented to the ED 12/21/2016  for evaluation of accidental removal of a percutaneous abdominal drain. Patient reports waking up around 2:00 AM with increased abdominal pain and the drain out of her abdomen. She reports worse abdominal pain the past 24 hours but states that she also tried to control her pain with ibuprofen and abruptly stopped her oxycodone (last dose 8/22 at 0700) because it makes her "loopy" (previously she required 10-15 mg q 3-4 h to control her post-operative pain). Pain described as constant, non-radiating. Relieved by ibuprofen/oxycodone. No aggravating factors. Associated sxs include one episode diarrhea last night, more watery than preenies fever, chills, SOB, worsening nausea, or vomiting. She has been eating 3 very small meals daily with occasional fruit in between. She is currently on oral Augmentin/Fluconazole.        Patient saw Dr. Barry Dienes in the office this past week where she had one perc drain removed and one left in place due to concerns for possible pancreatic and/or biliary anastomotic leak. The drain has been putting out 70-95 cc daily of think, gray/tan  drainage.    ED workup: WBC 18 (from 26 2 weeks ago), mild hyponatremia (133), albumin 2.6 from 2.1, normal lactic acid. CT abd/pelv pending.  Hospital Course: CT scan showed upper abdominal fluid collections.  The patient was started on antibiotics.  She was taken to interventional radiology where 2 drains were placed one under the left lobe of the liver and one in the anterior abdominal cavity.  These both drained purulent fluid.  Cultures are growing gram-negative rods.    On 12/23/2016 the patient felt much much better and wanted to go home.  She said her pain was a great deal better.  She was tolerating diet.  She was ambulating independently.  Her abdomen was soft and nontender and the drains had.  On drainage.    Diet, activities, and drain care was reinforced.  I gave her another prescription for oxycodone.  She has Augmentin at home and will continue to take that.  She will keep her scheduled appointmentt with Dr. Barry Dienes on August 27.  This coming Monday.  Treatments: CT-guided percutaneous drainage of abdominal abscesses.  Disposition: Home  Patient Instructions:  Allergies as of 12/23/2016   No Known Allergies     Medication List    TAKE these medications   alum & mag hydroxide-simeth 200-200-20 MG/5ML  suspension Commonly known as:  MAALOX/MYLANTA Take 15 mLs by mouth every 6 (six) hours as needed for indigestion or heartburn.   amoxicillin-clavulanate 875-125 MG tablet Commonly known as:  AUGMENTIN Take 1 tablet by mouth every 12 (twelve) hours.   citalopram 20 MG tablet Commonly known as:  CELEXA Take 20 mg by mouth daily with breakfast.   fluconazole 200 MG tablet Commonly known as:  DIFLUCAN Take 1 tablet (200 mg total) by mouth daily.   ibuprofen 200 MG tablet Commonly known as:  ADVIL,MOTRIN Take 400-600 mg by mouth every 8 (eight) hours as needed for headache or mild pain.   methocarbamol 500 MG tablet Commonly known as:  ROBAXIN Take 1 tablet (500 mg  total) by mouth every 6 (six) hours as needed for muscle spasms.   oxyCODONE 5 MG immediate release tablet Commonly known as:  Oxy IR/ROXICODONE Take 1-3 tablets (5-15 mg total) by mouth every 2 (two) hours as needed for moderate pain, severe pain or breakthrough pain. What changed:  how much to take  when to take this   oxyCODONE 5 MG immediate release tablet Commonly known as:  Oxy IR/ROXICODONE Take 1-2 tablets (5-10 mg total) by mouth every 4 (four) hours as needed for moderate pain or breakthrough pain. What changed:  You were already taking a medication with the same name, and this prescription was added. Make sure you understand how and when to take each.            Discharge Care Instructions        Start     Ordered   12/23/16 0000  oxyCODONE (OXY IR/ROXICODONE) 5 MG immediate release tablet  Every 4 hours PRN     12/23/16 0850   12/23/16 0000  Increase activity slowly     12/23/16 0850   12/23/16 0000  Diet - low sodium heart healthy     12/23/16 0850   12/23/16 0000  Discharge instructions    Comments:  Drink lots of fluids and stay well hydrated.  Keep a written record of the drainage and bring that to the office with you  We have given you another prescription for pain medicine You already have a prescription for the Augmentin antibiotics and continue to take that  Keep your appointment with Dr. Barry Dienes on August 27   12/23/16 0850   12/23/16 0000  May walk up steps     12/23/16 0850   12/23/16 0000  May shower / Bathe     12/23/16 0850   12/23/16 0000  Driving Restrictions    Comments:  1-3 weeks   12/23/16 0850   12/23/16 0000  Lifting restrictions    Comments:  15 lbs   12/23/16 0850   12/23/16 0000  Discharge wound care:    Comments:  Empty the drain as necessary. Keep a  written record of the volume of drainage   12/23/16 0850   12/23/16 0000  Call MD for:  persistant dizziness or light-headedness     12/23/16 0850   12/23/16 0000  Call MD  for:  hives     12/23/16 0850   12/23/16 0000  Call MD for:  redness, tenderness, or signs of infection (pain, swelling, redness, odor or green/yellow discharge around incision site)     12/23/16 0850   12/23/16 0000  Call MD for:  difficulty breathing, headache or visual disturbances     12/23/16 0850   12/23/16 0000  Call MD for:  persistant nausea and  vomiting     12/23/16 0850   12/23/16 0000  Call MD for:  temperature >100.4     12/23/16 0850   12/23/16 0000  Call MD for:  severe uncontrolled pain     12/23/16 0850      Activity: activity as tolerated Diet: low fat, low cholesterol diet Wound Care: as directed  Follow-up:  With Dr. Barry Dienes in 2 days.  Signed: Edsel Petrin. Dalbert Batman, M.D., FACS General and minimally invasive surgery Breast and Colorectal Surgery  12/23/2016, 8:53 AM

## 2016-12-23 NOTE — Discharge Instructions (Signed)
-  see above 

## 2016-12-23 NOTE — Progress Notes (Signed)
Small amount of purulent drainage noted at the old drain site, new dressing applied. Taught and demonstrated pt and spouse how to do drain care, flushes and dressing change. Verbalized understanding. Discharge instructions given to pt. Discharged to home.

## 2016-12-24 NOTE — ED Provider Notes (Signed)
[facility] [facility] Provider Note   CSN: [id number] Arrival date & time: 12/21/16  9476     History   Chief Complaint Chief Complaint  Patient presents with  . Abdominal Pain    HPI Krystal Reynolds is a 46 y.o. female.  HPI Patient presents to the emergency department with abdominal discomfort after her pain related to drain fell out last night at 2 AM the patient states that they removed one of her other drains last week.  She states that she started having increasing pain after the drain came dislodged.  She states that her pain medications at home were not effective.  She states she did not speak with the on-call surgeon, just came straight to the emergency department.  Patient states that certain movements and palpation make the pain worse.  Patient states nothing.  She attempted make the pain better.The patient denies chest pain, shortness of breath, headache,blurred vision, neck pain, fever, cough, weakness, numbness, dizziness, anorexia, edema,  nausea, vomiting, diarrhea, rash, back pain, dysuria, hematemesis, bloody stool, near syncope, or syncope. Past Medical History:  Diagnosis Date  . Anemia 01-02-12   mostly low iron- tx. oral meds, not in several yrs  . Chronic kidney disease 01-02-12   dx. rt. kidney neoplasm-surgery planned, past hx. protein in urine.  . 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 Depression   . Family history of anesthesia complication    mother slow to wake up   . GERD (gastroesophageal reflux disease)   . Leg swelling 01-02-12   occ. episodes of pitting edema lower extremities at end of day.  . Preeclampsia 1998  . Pregnancy induced hypertension 1998  . Sleep apnea    "dx'd; used mask for ~ 1 yr; lost weight; no longer wear mask" (12/21/2016)  . Thyroid enlargement 01/02/2012   left thyroid enlargement, being watched, S/P [place]; no tx.    Patient Active Problem  List   Diagnosis Date Noted  . JP drain, broken 12/21/2016  . Cyst of pancreas 11/23/2016  . Renal neoplasm 10/02/2013  . Cancer ([place])   . Renal mass 12/07/2011  . Pancreatic head mass 10/30/2011  . Morbid obesity ([place]) 09/07/2011    Past Surgical History:  Procedure Laterality Date  . BREATH TEK H PYLORI  09/20/2011   Procedure: BREATH TEK H PYLORI;  Surgeon: Shann Medal, MD;  Location: [facility];  Service: General;  Laterality: N/A;  . childbirth  01-02-12   x2 NVD- pre-exclampsia with pregrancy  . CHOLECYSTECTOMY  11/23/2016   'w/whipple"  . EUS  12/07/2011   Procedure: UPPER ENDOSCOPIC ULTRASOUND (EUS) LINEAR;  Surgeon: Milus Banister, MD;  Location: [facility];  Service: Endoscopy;  Laterality: N/A;  radial linear  . ROBOTIC ASSITED PARTIAL NEPHRECTOMY Left 10/02/2013   Procedure: ROBOTIC ASSITED PARTIAL NEPHRECTOMY;  Surgeon: Raynelle Bring, MD;  Location: [facility];  Service: Urology;  Laterality: Left;  . ROBOTIC ASSITED PARTIAL NEPHRECTOMY Right 12/2011  . VAGINAL HYSTERECTOMY  2008  . WHIPPLE PROCEDURE N/A 11/23/2016   Procedure: WHIPPLE PROCEDURE;  Surgeon: Stark Klein, MD;  Location: [facility];  Service: General;  Laterality: N/A;    OB History    No data available       Home Medications    Prior to Admission medications   Medication Sig Start Date End Date Taking? Authorizing Provider  alum & mag hydroxide-simeth (MAALOX/MYLANTA) 200-200-20 MG/5ML suspension Take  15 mLs by mouth every 6 (six) hours as needed for indigestion or heartburn.   Yes [provider]  amoxicillin-clavulanate (AUGMENTIN) 875-125 MG tablet Take 1 tablet by mouth every 12 (twelve) hours. 12/08/16  Yes Stark Klein, MD  citalopram (CELEXA) 20 MG tablet Take 20 mg by mouth daily with breakfast.    Yes [provider]  fluconazole (DIFLUCAN) 200 MG tablet Take 1 tablet (200 mg total) by mouth daily. 12/08/16  Yes Stark Klein, MD  ibuprofen (ADVIL,MOTRIN) 200 MG tablet Take  400-600 mg by mouth every 8 (eight) hours as needed for headache or mild pain.   Yes [provider]  methocarbamol (ROBAXIN) 500 MG tablet Take 1 tablet (500 mg total) by mouth every 6 (six) hours as needed for muscle spasms. 12/08/16  Yes Stark Klein, MD  oxyCODONE (OXY IR/ROXICODONE) 5 MG immediate release tablet Take 1-3 tablets (5-15 mg total) by mouth every 2 (two) hours as needed for moderate pain, severe pain or breakthrough pain. Patient taking differently: Take 2-10 mg by mouth every three (3) days as needed for moderate pain, severe pain or breakthrough pain.  12/08/16  Yes Stark Klein, MD  oxyCODONE (OXY IR/ROXICODONE) 5 MG immediate release tablet Take 1-2 tablets (5-10 mg total) by mouth every 4 (four) hours as needed for moderate pain or breakthrough pain. 12/23/16   Fanny Skates, MD    Family History Family History  Problem Relation Age of Onset  . Cancer Paternal Uncle        lung  . AAA (abdominal aortic aneurysm) Maternal Grandfather        60s-70s    Social History Social History  Substance Use Topics  . Smoking status: Never Smoker  . Smokeless tobacco: Never Used  . Alcohol use Yes     Comment: 12/21/2016 "beer twice/month; if that"     Allergies   Patient has no known allergies.   Review of Systems Review of Systems All other systems negative except as documented in the HPI. All pertinent positives and negatives as reviewed in the HPI.  Physical Exam Updated Vital Signs BP 115/72 (BP Location: Right Arm)   Pulse 75   Temp 97.8 F (36.6 C) (Oral)   Resp 18   Ht 5\' 7"  (1.702 m)   Wt 90.7 kg (200 lb)   SpO2 97%   BMI 31.32 kg/m   Physical Exam  Constitutional: She is oriented to person, place, and time. She appears well-developed and well-nourished. No distress.  HENT:  Head: Normocephalic and atraumatic.  Mouth/Throat: Oropharynx is clear and moist.  Eyes: Pupils are equal, round, and reactive to light.  Neck: Normal range of  motion. Neck supple.  Cardiovascular: Normal rate, regular rhythm and normal heart sounds.  Exam reveals no gallop and no friction rub.   No murmur heard. Pulmonary/Chest: Effort normal and breath sounds normal. No respiratory distress. She has no wheezes.  Abdominal: Soft. Bowel sounds are normal. She exhibits no distension. There is tenderness. There is no rebound and no guarding.    Neurological: She is alert and oriented to person, place, and time. She exhibits normal muscle tone. Coordination normal.  Skin: Skin is warm and dry. Capillary refill takes less than 2 seconds. No rash noted. No erythema.  Psychiatric: She has a normal mood and affect. Her behavior is normal.  Nursing note and vitals reviewed.    ED Treatments / Results  Labs (all labs ordered are listed, but only abnormal results are displayed) Labs  Reviewed  COMPREHENSIVE METABOLIC PANEL - Abnormal; Notable for the following:       Result Value   Sodium 133 (*)    Chloride 97 (*)    Glucose, Bld 113 (*)    Albumin 2.6 (*)    Alkaline Phosphatase 228 (*)    All other components within normal limits  CBC WITH DIFFERENTIAL/PLATELET - Abnormal; Notable for the following:    WBC 18.4 (*)    RBC 3.82 (*)    Hemoglobin 10.0 (*)    HCT 31.4 (*)    RDW 16.0 (*)    Platelets 764 (*)    Neutro Abs 15.7 (*)    Monocytes Absolute 1.4 (*)    All other components within normal limits  URINALYSIS, ROUTINE W REFLEX MICROSCOPIC - Abnormal; Notable for the following:    Specific Gravity, Urine >1.046 (*)    Ketones, ur 20 (*)    Protein, ur 30 (*)    Squamous Epithelial / LPF 0-5 (*)    All other components within normal limits  LIPASE, BLOOD - Abnormal; Notable for the following:    Lipase 76 (*)    All other components within normal limits  CBC - Abnormal; Notable for the following:    WBC 15.0 (*)    RBC 3.52 (*)    Hemoglobin 9.0 (*)    HCT 29.5 (*)    MCH 25.6 (*)    RDW 16.3 (*)    Platelets 681 (*)    All  other components within normal limits  COMPREHENSIVE METABOLIC PANEL - Abnormal; Notable for the following:    Chloride 98 (*)    Glucose, Bld 117 (*)    Calcium 8.8 (*)    Albumin 2.3 (*)    Alkaline Phosphatase 190 (*)    All other components within normal limits  CBC - Abnormal; Notable for the following:    WBC 13.7 (*)    RBC 3.52 (*)    Hemoglobin 9.3 (*)    HCT 29.7 (*)    RDW 16.8 (*)    Platelets 665 (*)    All other components within normal limits  PROTIME-INR - Abnormal; Notable for the following:    Prothrombin Time 15.6 (*)    All other components within normal limits  CULTURE, BLOOD (ROUTINE X 2)  CULTURE, BLOOD (ROUTINE X 2)  AEROBIC/ANAEROBIC CULTURE (SURGICAL/DEEP WOUND)  AEROBIC/ANAEROBIC CULTURE (SURGICAL/DEEP WOUND)  HIV ANTIBODY (ROUTINE TESTING)  CREATININE, SERUM  MAGNESIUM  PHOSPHORUS  APTT  I-STAT CG4 LACTIC ACID, ED    EKG  EKG Interpretation None       Radiology Ct Image Guided Drainage By Percutaneous Catheter  Result Date: 12/22/2016 INDICATION: 46 year old with history of Whipple procedure. Patient has postoperative fluid collections which are concerning for abscess collections. Patient also has drainage from the midline abdominal incision. EXAM: CT-GUIDED DRAINAGE OF ABDOMINAL FLUID COLLECTIONS X 2 MEDICATIONS: The patient is currently admitted to the hospital and receiving intravenous antibiotics. The antibiotics were administered within an appropriate time frame prior to the initiation of the procedure. ANESTHESIA/SEDATION: 1.0 mg IV Versed 100 mcg IV Fentanyl Moderate Sedation Time:  20 minutes The patient was continuously monitored during the procedure by the interventional radiology nurse under my direct supervision. COMPLICATIONS: None immediate. TECHNIQUE: Informed written consent was obtained from the patient after a thorough discussion of the procedural risks, benefits and alternatives. All questions were addressed. Maximal Sterile  Barrier Technique was utilized including caps, mask, sterile gowns, sterile gloves, sterile drape,  hand hygiene and skin antiseptic. A timeout was performed prior to the initiation of the procedure. PROCEDURE: Patient was placed supine on the CT scanner. Images through the abdomen were obtained. The upper collection near the left hepatic lobe was initially targeted. The anterior abdomen was prepped and draped in sterile fashion. Skin was anesthetized with 1% lidocaine. 18 gauge trocar needle was directed into this upper abdominal fluid collection with CT guidance and yellow purulent fluid was aspirated. Stiff Amplatz wire was placed. Tract was dilated to accommodate a 102. [place] multipurpose drain. Drain was placed and a total of 40 mL of yellow/brown purulent fluid was removed. Catheter was sutured to skin and attached to a suction bulb. This was labeled as Drain #1. There was also a fluid collection in the anterior abdomen, just posterior to a subcutaneous collection. This area was anesthetized with 1% lidocaine. 18 gauge needle directed into this collection with CT guidance and yellow purulent fluid was aspirated. Stiff Amplatz wire was placed. Tract was dilated to accommodate a 10.2 [place] multipurpose drain. This was labeled as Drain #2. 10 mL of yellow purulent fluid was removed. Catheter was sutured to the skin and attached to a suction bulb. FINDINGS: Irregular fluid collections in the anterior abdomen. Drain #1 was placed in the upper collection adjacent to the left hepatic lobe. Drain #2 placed in the more inferior collection which is posterior to the subcutaneous fluid. IMPRESSION: Successful placement of two drains within the anterior abdomen with CT guidance. Electronically Signed   By: Markus Daft M.D.   On: 12/22/2016 20:39   Ct Image Guided Drainage By Percutaneous Catheter  Result Date: 12/22/2016 INDICATION: 46 year old with history of Whipple procedure. Patient has postoperative fluid  collections which are concerning for abscess collections. Patient also has drainage from the midline abdominal incision. EXAM: CT-GUIDED DRAINAGE OF ABDOMINAL FLUID COLLECTIONS X 2 MEDICATIONS: The patient is currently admitted to the hospital and receiving intravenous antibiotics. The antibiotics were administered within an appropriate time frame prior to the initiation of the procedure. ANESTHESIA/SEDATION: 1.0 mg IV Versed 100 mcg IV Fentanyl Moderate Sedation Time:  20 minutes The patient was continuously monitored during the procedure by the interventional radiology nurse under my direct supervision. COMPLICATIONS: None immediate. TECHNIQUE: Informed written consent was obtained from the patient after a thorough discussion of the procedural risks, benefits and alternatives. All questions were addressed. Maximal Sterile Barrier Technique was utilized including caps, mask, sterile gowns, sterile gloves, sterile drape, hand hygiene and skin antiseptic. A timeout was performed prior to the initiation of the procedure. PROCEDURE: Patient was placed supine on the CT scanner. Images through the abdomen were obtained. The upper collection near the left hepatic lobe was initially targeted. The anterior abdomen was prepped and draped in sterile fashion. Skin was anesthetized with 1% lidocaine. 18 gauge trocar needle was directed into this upper abdominal fluid collection with CT guidance and yellow purulent fluid was aspirated. Stiff Amplatz wire was placed. Tract was dilated to accommodate a 102. [place] multipurpose drain. Drain was placed and a total of 40 mL of yellow/brown purulent fluid was removed. Catheter was sutured to skin and attached to a suction bulb. This was labeled as Drain #1. There was also a fluid collection in the anterior abdomen, just posterior to a subcutaneous collection. This area was anesthetized with 1% lidocaine. 18 gauge needle directed into this collection with CT guidance and yellow  purulent fluid was aspirated. Stiff Amplatz wire was placed. Tract was dilated to accommodate a  10.2 [place] multipurpose drain. This was labeled as Drain #2. 10 mL of yellow purulent fluid was removed. Catheter was sutured to the skin and attached to a suction bulb. FINDINGS: Irregular fluid collections in the anterior abdomen. Drain #1 was placed in the upper collection adjacent to the left hepatic lobe. Drain #2 placed in the more inferior collection which is posterior to the subcutaneous fluid. IMPRESSION: Successful placement of two drains within the anterior abdomen with CT guidance. Electronically Signed   By: Markus Daft M.D.   On: 12/22/2016 20:39    Procedures Procedures (including critical care time)  Medications Ordered in ED Medications  iopamidol (ISOVUE-300) 61 % injection (not administered)  lidocaine (PF) (XYLOCAINE) 1 % injection (not administered)  sodium chloride 0.9 % bolus 1,000 mL (0 mLs Intravenous Stopped 12/21/16 1201)  HYDROmorphone (DILAUDID) injection 1 mg (1 mg Intravenous Given 12/21/16 0859)  iopamidol (ISOVUE-300) 61 % injection (100 mLs  Contrast Given 12/21/16 1513)  midazolam (VERSED) injection (1 mg Intravenous Given 12/22/16 1457)  fentaNYL (SUBLIMAZE) injection (25 mcg Intravenous Given 12/22/16 1519)     Initial Impression / Assessment and Plan / ED Course  I have reviewed the triage vital signs and the nursing notes.  Pertinent labs & imaging results that were available during my care of the patient were reviewed by me and considered in my medical decision making (see chart for details).     Spoke with general surgery soon after her arrival.  They will evaluate the patient and determine the next course of action for her.  They feel like a CT scan will help determine what may be necessary.  Patient is feeling better following pain medications.  She is also given IV fluids.  Patient is advised to the plan and all questions were answered.  General surgery  advised that they were attempting to get interventional radiology to drain the area and replace the drain.  If not, they are going to admit her to the hospital  Final Clinical Impressions(s) / ED Diagnoses   Final diagnoses:  Abdominal fluid collection    New Prescriptions Discharge Medication List as of 12/23/2016 12:11 PM       Dalia Heading, PA-C 12/24/16 [id number]    Gareth Morgan, MD 12/26/16 [id number]

## 2016-12-26 ENCOUNTER — Other Ambulatory Visit: Payer: Self-pay | Admitting: General Surgery

## 2016-12-26 DIAGNOSIS — K862 Cyst of pancreas: Secondary | ICD-10-CM

## 2016-12-26 LAB — CULTURE, BLOOD (ROUTINE X 2)
CULTURE: NO GROWTH
CULTURE: NO GROWTH
SPECIAL REQUESTS: ADEQUATE
Special Requests: ADEQUATE

## 2016-12-27 LAB — AEROBIC/ANAEROBIC CULTURE (SURGICAL/DEEP WOUND)

## 2016-12-27 LAB — AEROBIC/ANAEROBIC CULTURE W GRAM STAIN (SURGICAL/DEEP WOUND)

## 2016-12-28 ENCOUNTER — Ambulatory Visit
Admission: RE | Admit: 2016-12-28 | Discharge: 2016-12-28 | Disposition: A | Discharge status: Home / Self Care | Payer: BC Managed Care – PPO | Source: Ambulatory Visit | Attending: General Surgery | Admitting: General Surgery

## 2016-12-28 DIAGNOSIS — K862 Cyst of pancreas: Secondary | ICD-10-CM

## 2016-12-28 MED ORDER — IOPAMIDOL (ISOVUE-300) INJECTION 61%
100.0000 mL | Freq: Once | INTRAVENOUS | Status: AC | PRN
  Administered 2016-12-28: 100 mL via INTRAVENOUS

## 2016-12-29 ENCOUNTER — Other Ambulatory Visit: Payer: Self-pay | Admitting: General Surgery

## 2016-12-29 DIAGNOSIS — R188 Other ascites: Secondary | ICD-10-CM

## 2017-01-08 ENCOUNTER — Other Ambulatory Visit: Payer: Self-pay | Admitting: General Surgery

## 2017-01-08 DIAGNOSIS — R188 Other ascites: Secondary | ICD-10-CM

## 2017-01-11 ENCOUNTER — Ambulatory Visit
Admission: RE | Admit: 2017-01-11 | Discharge: 2017-01-11 | Disposition: A | Discharge status: Home / Self Care | Payer: BC Managed Care – PPO | Source: Ambulatory Visit | Attending: General Surgery | Admitting: General Surgery

## 2017-01-11 DIAGNOSIS — R188 Other ascites: Secondary | ICD-10-CM

## 2017-01-11 HISTORY — PX: IR RADIOLOGIST EVAL & MGMT: IMG5224

## 2017-01-11 MED ORDER — IOPAMIDOL (ISOVUE-300) INJECTION 61%
100.0000 mL | Freq: Once | INTRAVENOUS | Status: AC | PRN
  Administered 2017-01-11: 100 mL via INTRAVENOUS

## 2017-01-11 NOTE — Progress Notes (Signed)
Referring Physician(s):  Dr. Stark Klein  Chief Complaint: The patient is seen in follow up today s/p hepatic and central abdominal drain placements by Dr. Anselm Pancoast 12/22/16.  History of present illness: Krystal Reynolds in a 46 y.o. Female with past medical history of renal cell carcinoma in 2013 s/p bilateral partial nephrectomy.  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  She was discharged home with surgical drain in place, however this was accidentally dislodge at home on 12/21/16.  She developed abdominal pain and returned to hospital 12/22/16.  Dr. Anselm Pancoast placed drain inferior to left hepatic lobe and anterior abdominal cavity drain on 12/22/16.  Patient has been doing well since discharge.  She is currently on Cipro daily for antibiotic coverage. She has been able to increase her PO intake. Her anterior wall drain and G-tube were removed in surgery office this past Monday.  She presents to clinic today for follow-up and possible injection of her remaining abdominal drain.  She states has been doing well since drain removal Monday.  She has continued to flush drain twice daily and output has been stable at 10 mL per day.  She denies fever, chills, or significant changes in output.   Past Medical History:  Diagnosis Date  . Anemia 01-02-12   mostly low iron- tx. oral meds, not in several yrs  . Chronic kidney disease 01-02-12   dx. rt. kidney neoplasm-surgery planned, past hx. protein in urine.  . 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 Depression   . Family history of anesthesia complication    mother slow to wake up   . GERD (gastroesophageal reflux disease)   . Leg swelling 01-02-12   occ. episodes of pitting edema lower extremities at end of day.  . Preeclampsia 1998  . Pregnancy induced hypertension 1998  . Sleep apnea    "dx'd; used mask for ~ 1 yr; lost weight;  no longer wear mask" (12/21/2016)  . Thyroid enlargement 01/02/2012   left thyroid enlargement, being watched, S/P [place]; no tx.    Past Surgical History:  Procedure Laterality Date  . BREATH TEK H PYLORI  09/20/2011   Procedure: BREATH TEK H PYLORI;  Surgeon: Shann Medal, MD;  Location: [facility];  Service: General;  Laterality: N/A;  . childbirth  01-02-12   x2 NVD- pre-exclampsia with pregrancy  . CHOLECYSTECTOMY  11/23/2016   'w/whipple"  . EUS  12/07/2011   Procedure: UPPER ENDOSCOPIC ULTRASOUND (EUS) LINEAR;  Surgeon: Milus Banister, MD;  Location: [facility];  Service: Endoscopy;  Laterality: N/A;  radial linear  . ROBOTIC ASSITED PARTIAL NEPHRECTOMY Left 10/02/2013   Procedure: ROBOTIC ASSITED PARTIAL NEPHRECTOMY;  Surgeon: Raynelle Bring, MD;  Location: [facility];  Service: Urology;  Laterality: Left;  . ROBOTIC ASSITED PARTIAL NEPHRECTOMY Right 12/2011  . VAGINAL HYSTERECTOMY  2008  . WHIPPLE PROCEDURE N/A 11/23/2016   Procedure: WHIPPLE PROCEDURE;  Surgeon: Stark Klein, MD;  Location: [place];  Service: General;  Laterality: N/A;    Allergies: Patient has no known allergies.  Medications: Prior to Admission medications   Medication Sig Start Date End Date Taking? Authorizing Provider  alum & mag hydroxide-simeth (MAALOX/MYLANTA) 200-200-20 MG/5ML suspension Take 15 mLs by mouth every 6 (six) hours as needed for indigestion or heartburn.    [provider]  amoxicillin-clavulanate (AUGMENTIN) 875-125 MG tablet Take 1 tablet by mouth  every 12 (twelve) hours. 12/08/16   Stark Klein, MD  citalopram (CELEXA) 20 MG tablet Take 20 mg by mouth daily with breakfast.     [provider]  fluconazole (DIFLUCAN) 200 MG tablet Take 1 tablet (200 mg total) by mouth daily. 12/08/16   Stark Klein, MD  ibuprofen (ADVIL,MOTRIN) 200 MG tablet Take 400-600 mg by mouth every 8 (eight) hours as needed for headache or mild pain.    [provider]  methocarbamol (ROBAXIN)  500 MG tablet Take 1 tablet (500 mg total) by mouth every 6 (six) hours as needed for muscle spasms. 12/08/16   Stark Klein, MD  oxyCODONE (OXY IR/ROXICODONE) 5 MG immediate release tablet Take 1-3 tablets (5-15 mg total) by mouth every 2 (two) hours as needed for moderate pain, severe pain or breakthrough pain. Patient taking differently: Take 2-10 mg by mouth every three (3) days as needed for moderate pain, severe pain or breakthrough pain.  12/08/16   Stark Klein, MD  oxyCODONE (OXY IR/ROXICODONE) 5 MG immediate release tablet Take 1-2 tablets (5-10 mg total) by mouth every 4 (four) hours as needed for moderate pain or breakthrough pain. 12/23/16   Fanny Skates, MD     Family History  Problem Relation Age of Onset  . Cancer Paternal Uncle        lung  . AAA (abdominal aortic aneurysm) Maternal Grandfather        60s-70s    Social History   Social History  . Marital status: Married    Spouse name: N/A  . Number of children: 2  . 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 Smoking status: Never Smoker  . Smokeless tobacco: Never Used  . Alcohol use Yes     Comment: 12/21/2016 "beer twice/month; if that"  . Drug use: No  . Sexual activity: Not Currently   Other Topics Concern  . Not on file   Social History Narrative  . No narrative on file     Vital Signs: There were no vitals taken for this visit.  Physical Exam  NAD, alert Heart:  Regular rate and rhythm Lungs: no increased work of breathing, lungs clear to ausculation Abdomen:  Dressing in place over open wound.  Central abdominal drain in place.  Insertion site intact without erythema or warmth.  Small amount of milky-yellow fluid in bulb.  Imaging: No results found.  Labs:  CBC:  Recent Labs  12/09/16 0438 12/21/16 0737 12/21/16 1936 12/22/16 0502  WBC 26.1* 18.4* 15.0* 13.7*  HGB 8.4* 10.0* 9.0* 9.3*  HCT 27.0* 31.4* 29.5*  29.7*  PLT 756* 764* 681* 665*    COAGS:  Recent Labs  11/15/16 1141 11/24/16 0531 12/22/16 0850  INR 0.97 1.09 1.23  APTT  --   --  32    BMP:  Recent Labs  12/07/16 0415 12/08/16 1043 12/21/16 0737 12/21/16 1936 12/22/16 0502  NA 136 133* 133*  --  135  K 3.7 3.9 4.3  --  4.0  CL 100* 99* 97*  --  98*  CO2 26 26 23   --  25  GLUCOSE 109* 115* 113*  --  117*  BUN 8 9 8   --  6  CALCIUM 8.1* 8.3* 9.0  --  8.8*  CREATININE 0.70 0.76 0.88 0.80 0.76  GFRNONAA >60 >60 >60 >60 >60  GFRAA >60 >60 >60 >60 >60    LIVER FUNCTION  TESTS:  Recent Labs  12/07/16 0415 12/08/16 1043 12/21/16 0737 12/22/16 0502  BILITOT 1.2 1.0 1.0 0.5  AST 36 26 30 27   ALT 40 32 32 24  ALKPHOS 397* 338* 228* 190*  PROT 6.4* 6.4* 7.8 7.3  ALBUMIN 2.1* 2.1* 2.6* 2.3*    Assessment: Patient with past medical history of renal cell carcinoma s/p bilateral partial nephrectomy and pancreatic cyst lesion s/p Whipple procedure presents to clinic for follow-up of abdominal drain placement.  Patient had 2 drains succesfully placed 12/22/16 by Dr. Anselm Pancoast for abdominal fluid collections.  Her anterior abdominal drain was removed earlier this week by surgeon.  Discussed with Dr. Kathlene Cote.  No drain injection needed.  CT Abdomen reviewed and shows improvement in fluid collection.  OK to remove remaining abdominal drain.  PA removed drain without complication.  Patient is to continue regular follow-up with surgery.   Signed: Docia Barrier, PA 01/11/2017, 3:11 PM   Please refer to Dr. Kathlene Cote attestation of this note for management and plan.

## 2017-01-24 ENCOUNTER — Other Ambulatory Visit (HOSPITAL_COMMUNITY): Payer: Self-pay | Admitting: Urology

## 2017-01-24 ENCOUNTER — Ambulatory Visit (HOSPITAL_COMMUNITY)
Admission: RE | Admit: 2017-01-24 | Discharge: 2017-01-24 | Disposition: A | Discharge status: Home / Self Care | Payer: BC Managed Care – PPO | Source: Ambulatory Visit | Attending: Urology | Admitting: Urology

## 2017-01-24 DIAGNOSIS — R918 Other nonspecific abnormal finding of lung field: Secondary | ICD-10-CM | POA: Diagnosis not present

## 2017-01-24 DIAGNOSIS — Z85528 Personal history of other malignant neoplasm of kidney: Secondary | ICD-10-CM

## 2017-02-02 ENCOUNTER — Other Ambulatory Visit: Payer: Self-pay | Admitting: General Surgery

## 2017-02-02 DIAGNOSIS — R0602 Shortness of breath: Secondary | ICD-10-CM

## 2017-02-02 DIAGNOSIS — K8689 Other specified diseases of pancreas: Secondary | ICD-10-CM

## 2017-02-05 ENCOUNTER — Ambulatory Visit
Admission: RE | Admit: 2017-02-05 | Discharge: 2017-02-05 | Disposition: A | Discharge status: Home / Self Care | Payer: BC Managed Care – PPO | Source: Ambulatory Visit | Attending: General Surgery | Admitting: General Surgery

## 2017-02-05 ENCOUNTER — Other Ambulatory Visit: Payer: Self-pay | Admitting: General Surgery

## 2017-02-05 DIAGNOSIS — R0602 Shortness of breath: Secondary | ICD-10-CM

## 2017-02-05 DIAGNOSIS — K8689 Other specified diseases of pancreas: Secondary | ICD-10-CM

## 2017-02-05 MED ORDER — IOHEXOL 300 MG/ML  SOLN
30.0000 mL | Freq: Once | INTRAMUSCULAR | Status: AC | PRN
  Administered 2017-02-05: 30 mL via ORAL

## 2017-02-12 ENCOUNTER — Encounter: Payer: Self-pay | Admitting: Interventional Radiology

## 2017-03-27 ENCOUNTER — Other Ambulatory Visit: Payer: Self-pay | Admitting: General Surgery

## 2017-03-27 DIAGNOSIS — T8189XS Other complications of procedures, not elsewhere classified, sequela: Secondary | ICD-10-CM

## 2017-03-29 ENCOUNTER — Ambulatory Visit
Admission: RE | Admit: 2017-03-29 | Discharge: 2017-03-29 | Disposition: A | Discharge status: Home / Self Care | Payer: BC Managed Care – PPO | Source: Ambulatory Visit | Attending: General Surgery | Admitting: General Surgery

## 2017-03-29 DIAGNOSIS — T8189XS Other complications of procedures, not elsewhere classified, sequela: Secondary | ICD-10-CM

## 2017-03-29 MED ORDER — IOPAMIDOL (ISOVUE-300) INJECTION 61%
100.0000 mL | Freq: Once | INTRAVENOUS | Status: AC | PRN
  Administered 2017-03-29: 100 mL via INTRAVENOUS

## 2017-03-30 ENCOUNTER — Other Ambulatory Visit: Payer: BC Managed Care – PPO

## 2017-07-30 ENCOUNTER — Other Ambulatory Visit: Payer: Self-pay | Admitting: General Surgery

## 2017-07-30 ENCOUNTER — Ambulatory Visit
Admission: RE | Admit: 2017-07-30 | Discharge: 2017-07-30 | Disposition: A | Discharge status: Home / Self Care | Payer: BC Managed Care – PPO | Source: Ambulatory Visit | Attending: General Surgery | Admitting: General Surgery

## 2017-07-30 DIAGNOSIS — R5082 Postprocedural fever: Secondary | ICD-10-CM

## 2017-07-30 MED ORDER — IOPAMIDOL (ISOVUE-300) INJECTION 61%
125.0000 mL | Freq: Once | INTRAVENOUS | Status: AC | PRN
  Administered 2017-07-30: 125 mL via INTRAVENOUS

## 2017-07-31 ENCOUNTER — Other Ambulatory Visit: Payer: Self-pay | Admitting: General Surgery

## 2017-07-31 ENCOUNTER — Telehealth: Payer: Self-pay | Admitting: General Surgery

## 2017-07-31 DIAGNOSIS — R509 Fever, unspecified: Secondary | ICD-10-CM

## 2017-07-31 NOTE — Telephone Encounter (Signed)
Left message

## 2017-08-01 ENCOUNTER — Telehealth: Payer: Self-pay | Admitting: General Surgery

## 2017-08-01 NOTE — Telephone Encounter (Signed)
-----   Message from April A Staton sent at 07/31/2017  4:03 PM EDT ----- Regarding: labs/CT results Dr. Barry Dienes,   Patient calling for results of CT and labs that were done yesterday due to PostOp fever. She can be reached at [phone]  Results of CT A/P.Marland Kitchen Labs are in allscript...  IMPRESSION: 1. When compared to prior exam there has been interval decrease in size of previously described collections anterior to the pancreaticoduodenal anastomosis and under the right hepatic lobe. Persistent but decreased linear bands of soft tissue within the central anterior upper abdomen and under the right hepatic lobe. 2. Biliary stent in place. New mild central intrahepatic biliary ductal dilatation. 3. Small amount of soft tissue extending off the superior pole of the left kidney at the site of prior partial nephrectomy, likely postsurgical in etiology. Recommend attention on follow-up.   Electronically Signed   By: Lovey Newcomer M.D.   On: 07/31/2017 09:16

## 2017-08-01 NOTE — Telephone Encounter (Signed)
Called pt and discussed scan and clinical situation.  Had been taking biotin.  LFTs sl elevated except for bilirubin.  Fever 104 not present anymore, but lower grade temps. Has a hernia on scan that is new.  Will try actigall and 1 week cipro.

## 2017-09-14 IMAGING — US US ABDOMEN LIMITED
1 series · 14 of 14 positions shown · non-contrast
Comparison: CT 12/01/2016

CLINICAL DATA: Evaluate for ascites and possible paracentesis.

EXAM:
LIMITED ABDOMEN ULTRASOUND FOR ASCITES
TECHNIQUE: Limited ultrasound survey for ascites was performed in all four
abdominal quadrants.

[Series 1: us abdomen limited · 0.31mm/px · 14 of 14 slices shown]
[im 1/14]
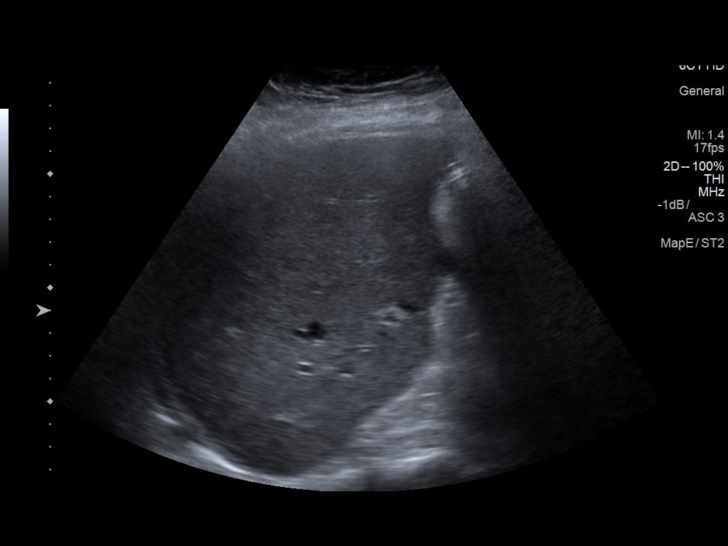
[im 2/14]
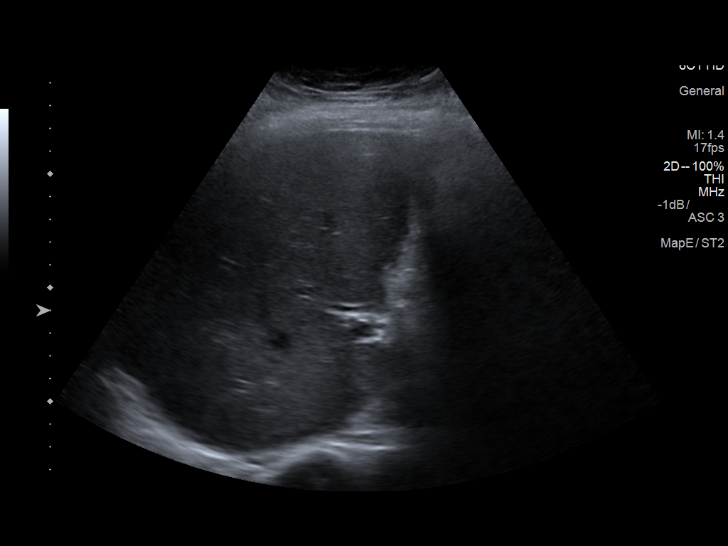
[im 3/14]
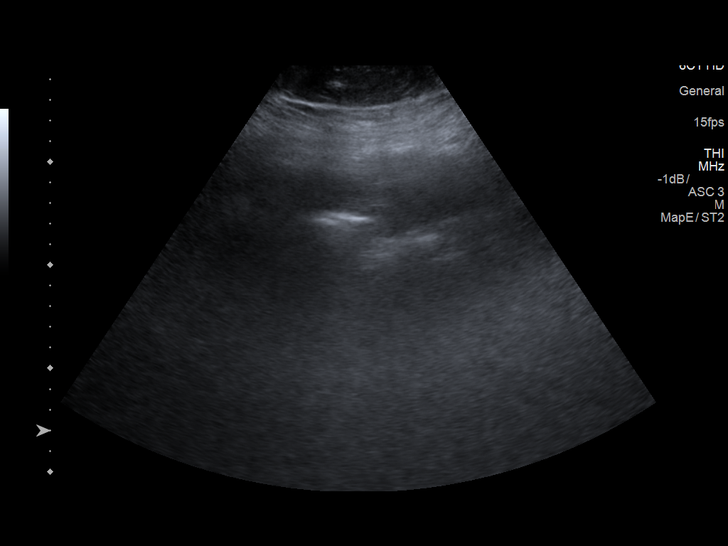
[im 4/14]
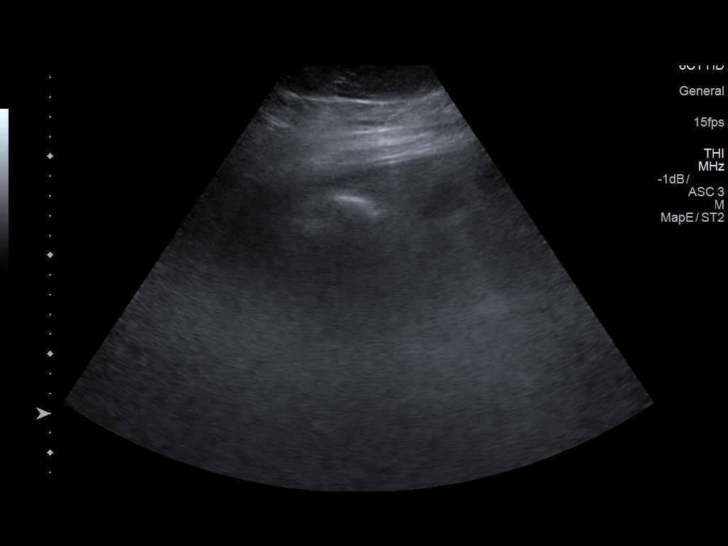
[im 5/14]
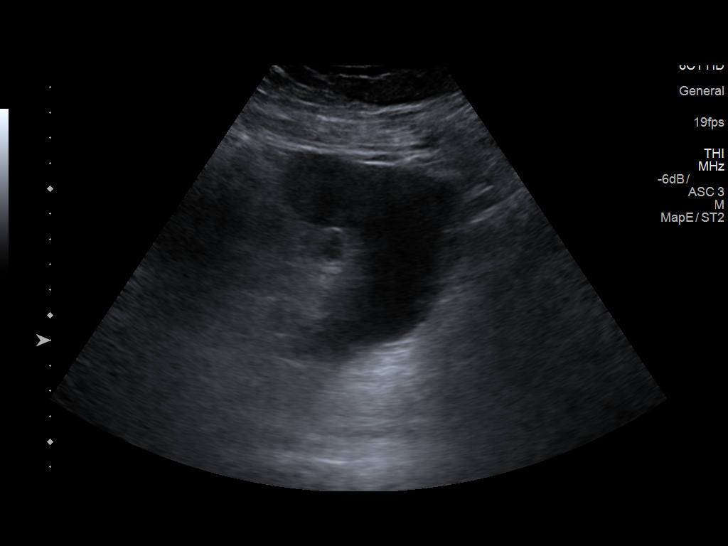
[im 6/14]
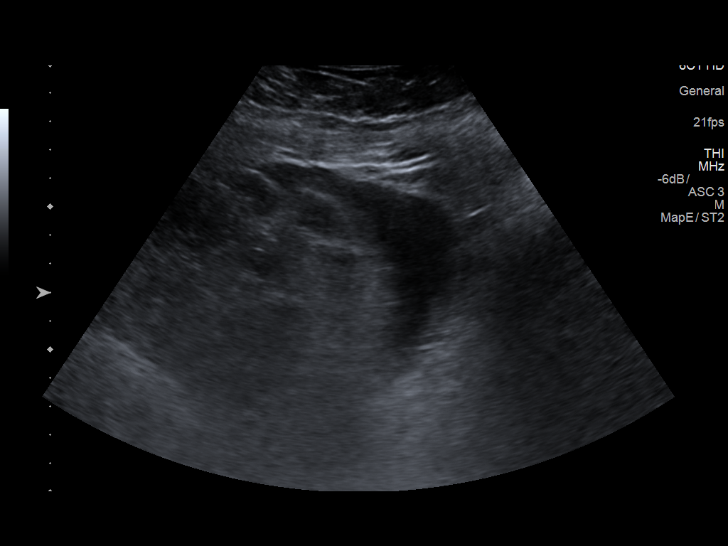
[im 7/14]
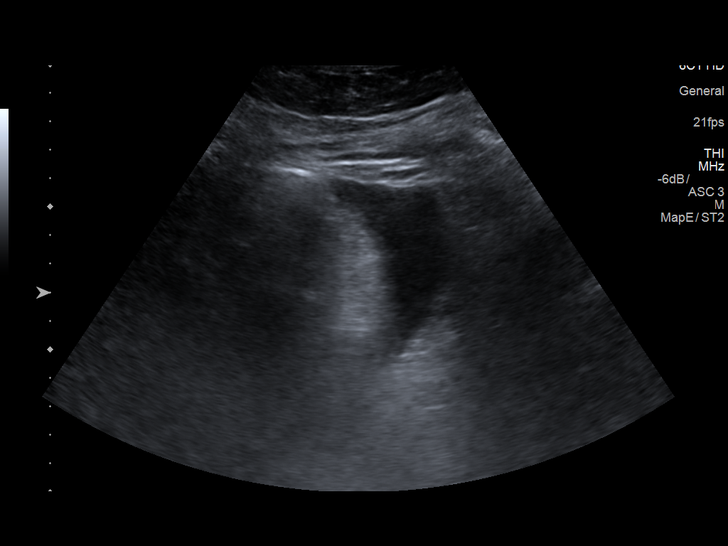
[im 8/14]
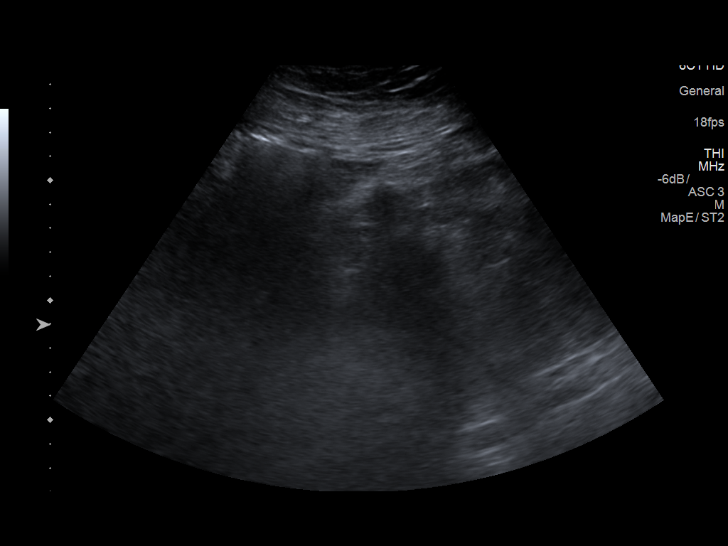
[im 9/14]
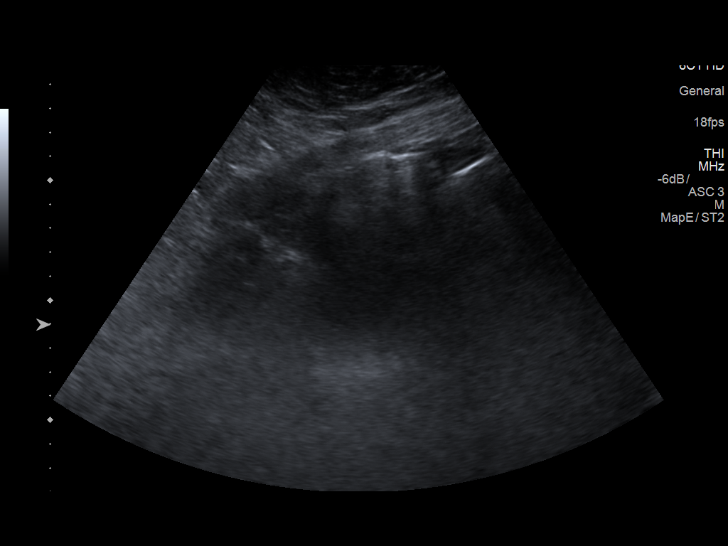
[im 10/14]
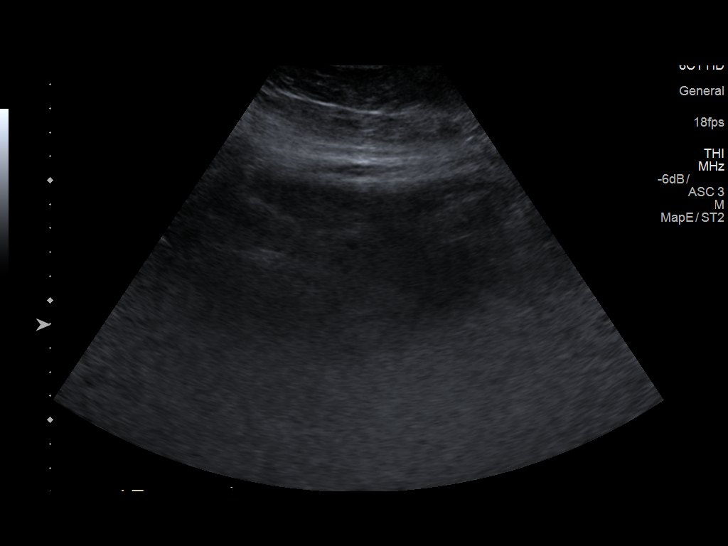
[im 11/14]
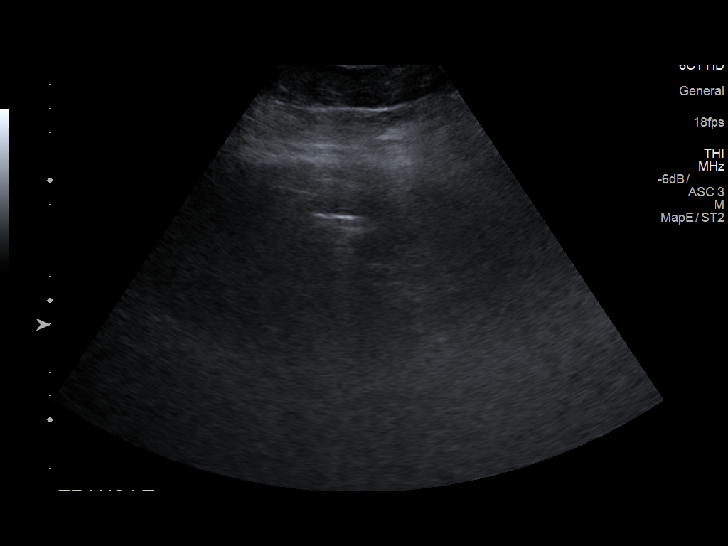
[im 12/14]
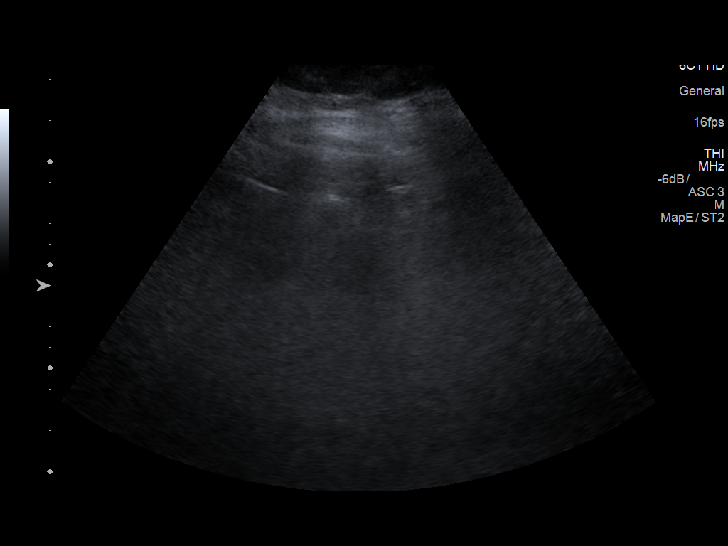
[im 13/14]
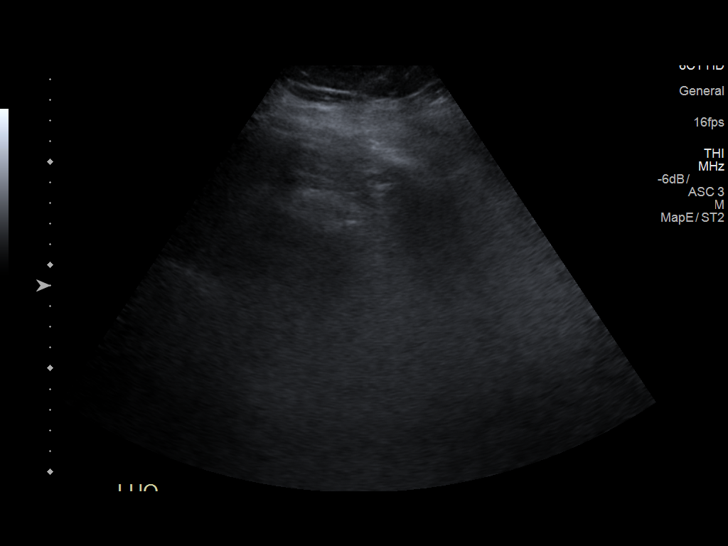
[im 14/14]
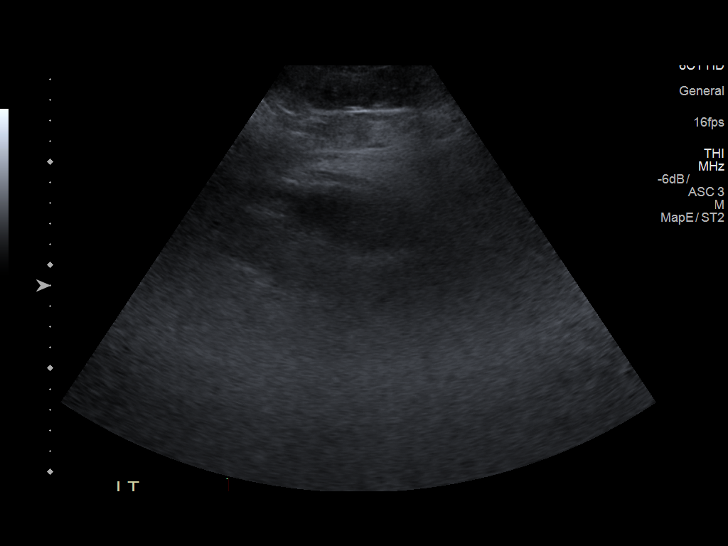

[14 of 14 positions shown; findings below may reference images not displayed]

FINDINGS: There is minimal ascites in the right lower quadrant and similar to
the recent CT. Minimal fluid in the left paracolic goiter. No
significant fluid around the liver.
IMPRESSION: Minimal ascites, most in the right lower quadrant. Not enough fluid
for a paracentesis.

## 2018-01-18 ENCOUNTER — Ambulatory Visit (HOSPITAL_COMMUNITY)
Admission: RE | Admit: 2018-01-18 | Discharge: 2018-01-18 | Disposition: A | Discharge status: Home / Self Care | Payer: BC Managed Care – PPO | Source: Ambulatory Visit | Attending: Urology | Admitting: Urology

## 2018-01-18 ENCOUNTER — Other Ambulatory Visit: Payer: Self-pay | Admitting: Urology

## 2018-01-18 DIAGNOSIS — Z85528 Personal history of other malignant neoplasm of kidney: Secondary | ICD-10-CM

## 2018-10-16 ENCOUNTER — Other Ambulatory Visit: Payer: Self-pay | Admitting: General Surgery

## 2018-10-16 DIAGNOSIS — R7989 Other specified abnormal findings of blood chemistry: Secondary | ICD-10-CM

## 2018-11-11 ENCOUNTER — Other Ambulatory Visit: Payer: Self-pay

## 2018-11-11 ENCOUNTER — Ambulatory Visit
Admission: RE | Admit: 2018-11-11 | Discharge: 2018-11-11 | Disposition: A | Discharge status: Home / Self Care | Payer: BC Managed Care – PPO | Source: Ambulatory Visit | Attending: General Surgery | Admitting: General Surgery

## 2018-11-11 DIAGNOSIS — R7989 Other specified abnormal findings of blood chemistry: Secondary | ICD-10-CM

## 2018-11-11 MED ORDER — GADOBENATE DIMEGLUMINE 529 MG/ML IV SOLN
19.0000 mL | Freq: Once | INTRAVENOUS | Status: AC | PRN
  Administered 2018-11-11: 19 mL via INTRAVENOUS

## 2018-11-11 NOTE — Progress Notes (Signed)
Please let patient know that Dr. Ardis Hughs is taking a look at it.  They are reading it as post op changes and some fatty liver, but nothing specific that should cause itching.

## 2018-11-13 ENCOUNTER — Encounter: Payer: Self-pay | Admitting: Gastroenterology

## 2018-11-13 ENCOUNTER — Other Ambulatory Visit: Payer: Self-pay | Admitting: Gastroenterology

## 2018-11-13 ENCOUNTER — Telehealth: Payer: Self-pay

## 2018-11-13 DIAGNOSIS — K760 Fatty (change of) liver, not elsewhere classified: Secondary | ICD-10-CM

## 2018-11-13 NOTE — Telephone Encounter (Signed)
Patient has been referred to our office by Dr Barry Dienes for fatty liver. Appointment has been made for 12/17/18 at 10:40am. She will come in for labs prior to her visit.

## 2018-12-04 IMAGING — CT CT ABD-PELV W/ CM
2 of 5 series · 15 of 46 positions shown, 17 images · IV contrast (Omni 300)
Comparison: 01/14/2016

CLINICAL DATA: Abdominal pain and low gray fever. Whipple [REDACTED].

EXAM:
CT ABDOMEN AND PELVIS WITH CONTRAST
TECHNIQUE: Multidetector CT imaging of the abdomen and pelvis was performed
using the standard protocol following bolus administration of
intravenous contrast.
CONTRAST:  100mL MKTBGP-4PP IOPAMIDOL (MKTBGP-4PP) INJECTION 61%

[Series 3: a/p w/ 5mm · axial · 0.83mm/px · z∈[-867,-362]mm · 12 of 113 slices shown, 14 images]
[im 6/113  soft-tissue]
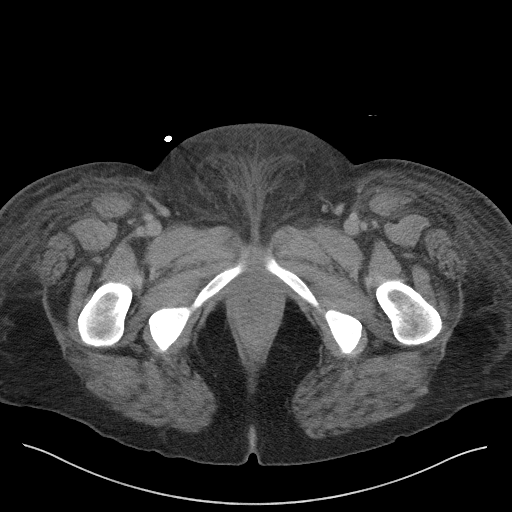
[im 6/113  bone]
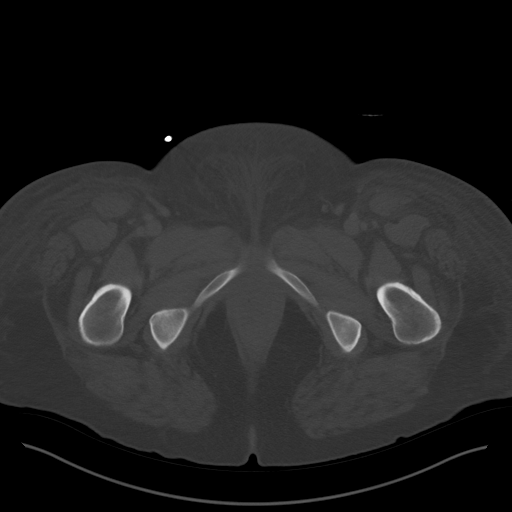
[im 17/113  soft-tissue]
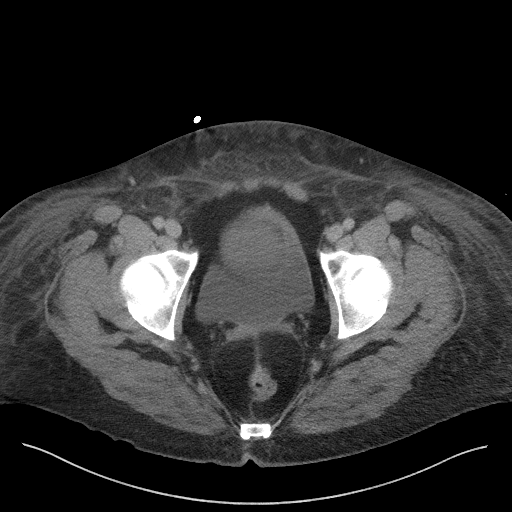
[im 23/113  soft-tissue]
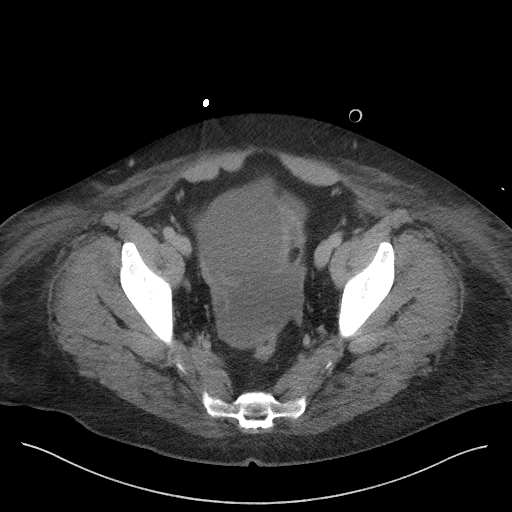
[im 34/113  soft-tissue]
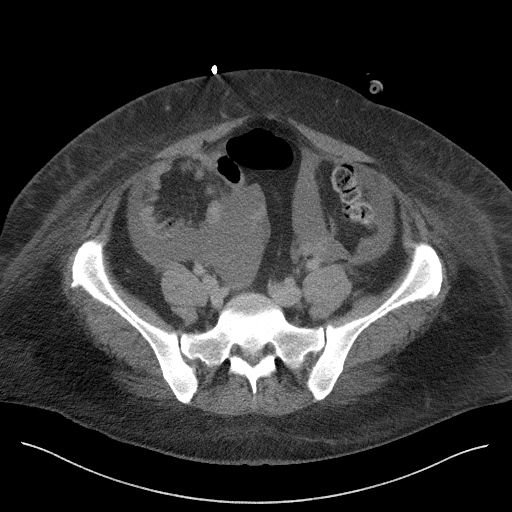
[im 45/113  soft-tissue]
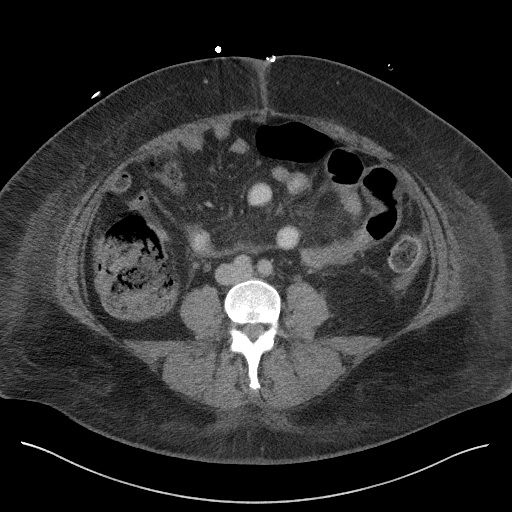
[im 51/113  soft-tissue]
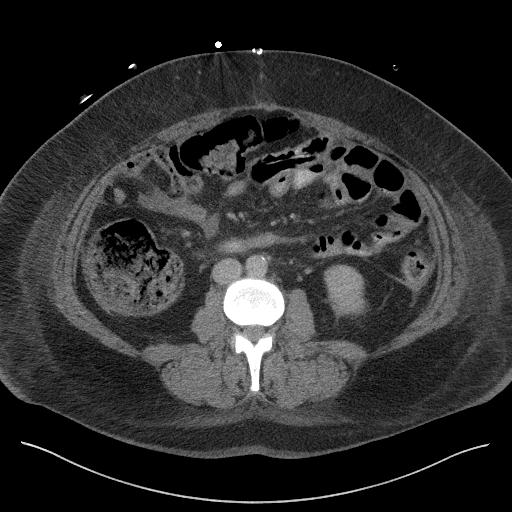
[im 62/113  soft-tissue]
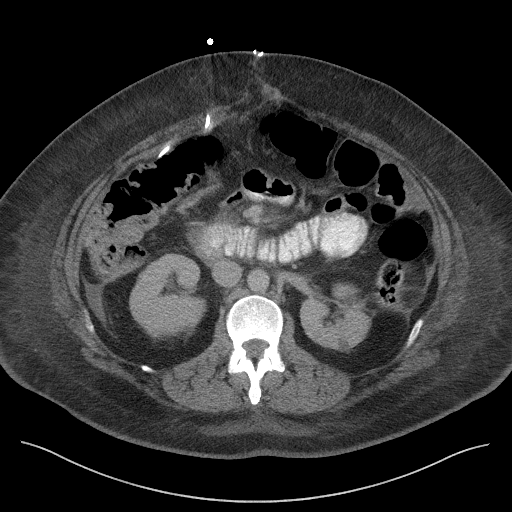
[im 68/113  soft-tissue]
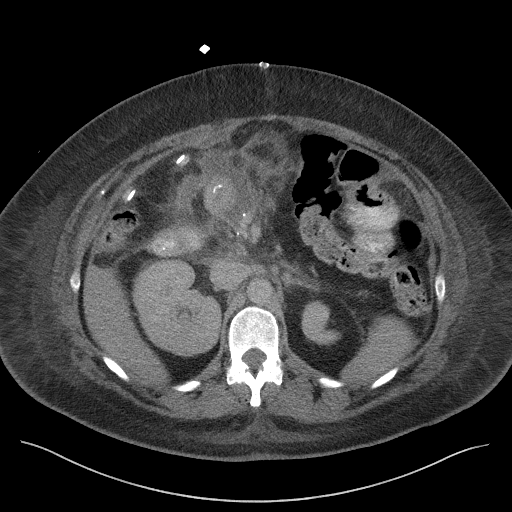
[im 79/113  soft-tissue]
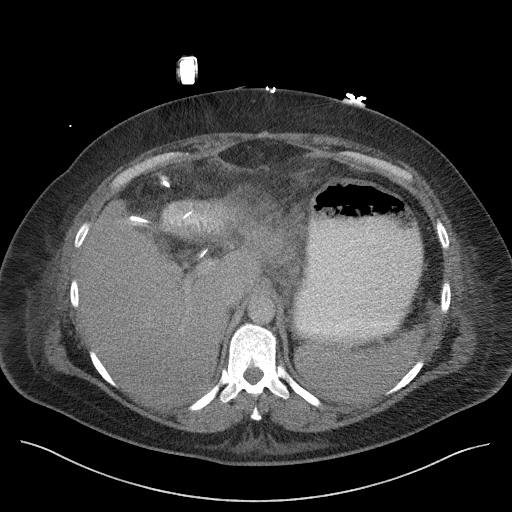
[im 79/113  bone]
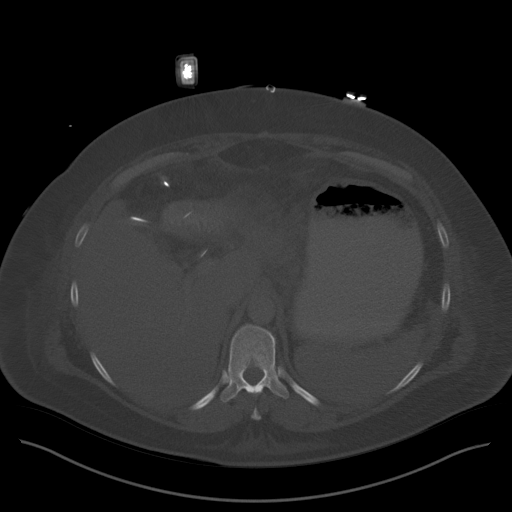
[im 90/113  soft-tissue]
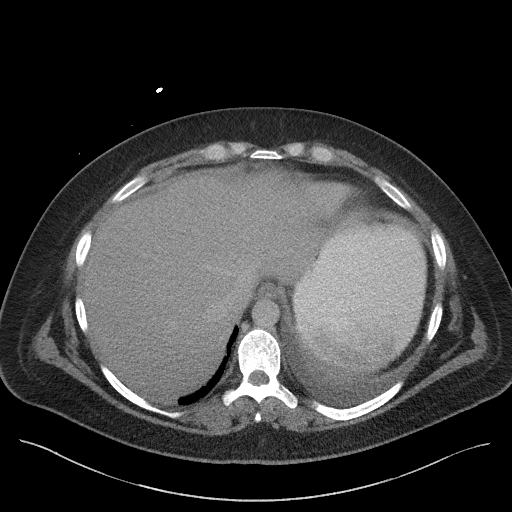
[im 96/113  soft-tissue]
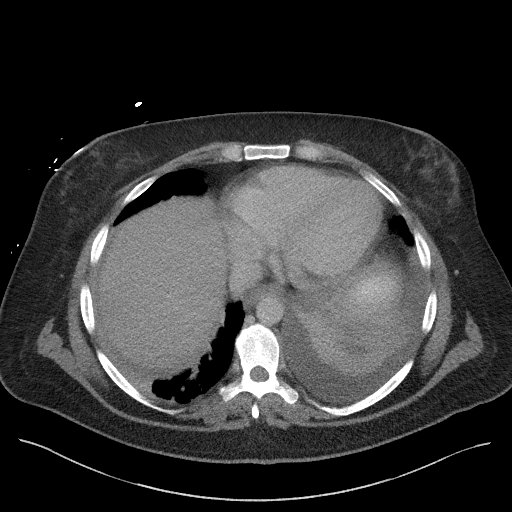
[im 107/113  soft-tissue]
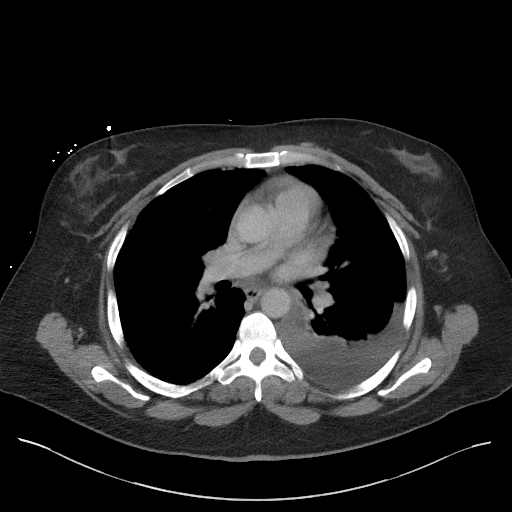

[Series 5: a/p w/ cor · coronal · 0.80mm/px · 3 of 151 slices shown]
[im 51/151  soft-tissue]
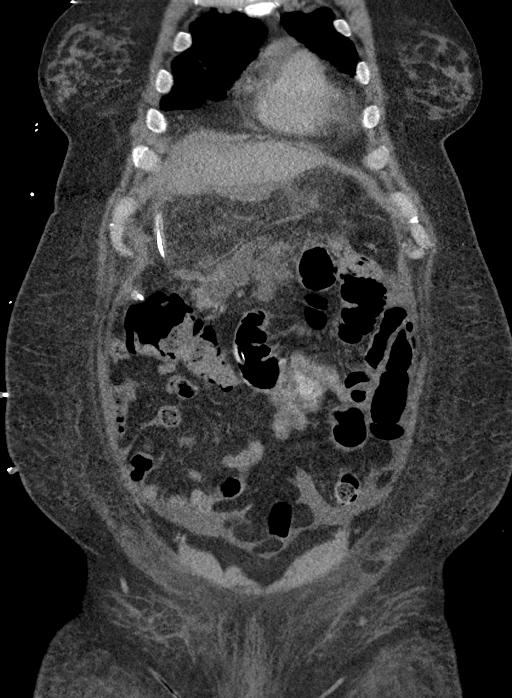
[im 67/151  soft-tissue]
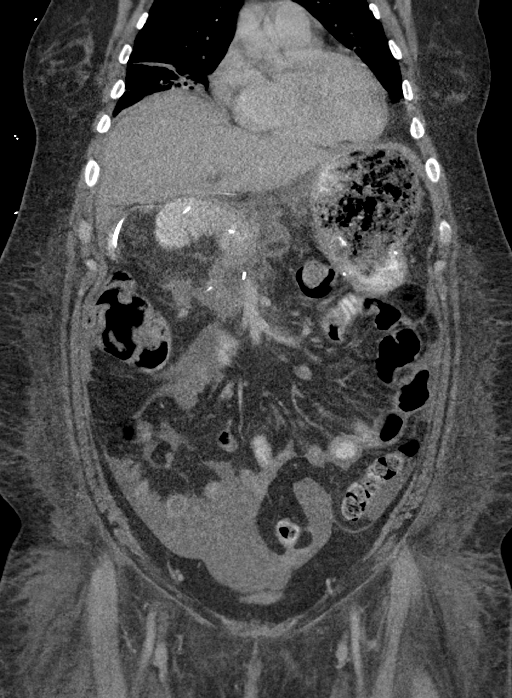
[im 84/151  soft-tissue]
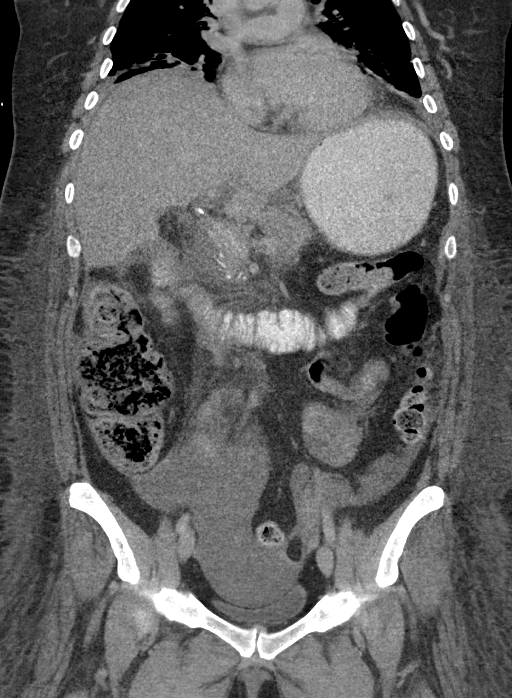

[15 of 46 positions shown; findings below may reference images not displayed]

FINDINGS: Lower chest: Small nearly moderate pleural effusion on the left that
is layering. Multi segment atelectasis, especially in the left lower
lobe.

Hepatobiliary: No focal liver abnormality.There is a feeding tube
within the biliary system traversing the choledochal jejunostomy. No
ductal dilatation.

Pancreas: Pancreatic head mass resection. There is a catheter in the
proximal small bowel which is presumably the pancreatic duct stent
that has migrated. There is edema around the pancreas without
organized collection, expected after surgery. Narrowing of the SMV
in the operative region without thrombosis or occlusion. Drains in
place without neighboring fluid collection.

Spleen: Unremarkable.

Adrenals/Urinary Tract: Negative adrenals. Partial nephrectomies in
the left upper pole and right lower pole. Unremarkable bladder.

Stomach/Bowel: Postoperative bowel. Oral contrast extends into the
biliary limb without extravasation. No ileus or obstruction. A
jejunostomy is in place.

Vascular/Lymphatic: Postoperative area narrowing of the SMV without
thrombosis or occlusion. Negative arterial structures. No mass or
adenopathy.

Reproductive:Hysterectomy.

Other: Small volume ascites mainly in the pelvis, non loculated.
Anasarca

Musculoskeletal: No acute abnormalities.
IMPRESSION: 1. Status post Whipple without drainable collection. Small ascites,
non loculated in the pelvis.
2. Oral contrast refluxed into the biliary limb with no
extravasation.
3. The biliary stent is in good position. No duct dilatation. The
pancreatic stent has migrated into the duodenum.
4. Multi segment atelectasis and small borderline moderate left
pleural effusion.
5. Moderate SMV narrowing in the operative region, likely
exacerbated by postop edema.
6. Anasarca.
7. Jejunostomy in good position.

## 2018-12-13 ENCOUNTER — Other Ambulatory Visit: Payer: Self-pay

## 2018-12-13 ENCOUNTER — Other Ambulatory Visit (INDEPENDENT_AMBULATORY_CARE_PROVIDER_SITE_OTHER): Payer: BC Managed Care – PPO

## 2018-12-13 DIAGNOSIS — K760 Fatty (change of) liver, not elsewhere classified: Secondary | ICD-10-CM

## 2018-12-13 LAB — HEPATIC FUNCTION PANEL
ALT: 105 U/L — ABNORMAL HIGH (ref 0–35)
AST: 93 U/L — ABNORMAL HIGH (ref 0–37)
Albumin: 4.1 g/dL (ref 3.5–5.2)
Alkaline Phosphatase: 540 U/L — ABNORMAL HIGH (ref 39–117)
Bilirubin, Direct: 0.2 mg/dL (ref 0.0–0.3)
Total Bilirubin: 0.5 mg/dL (ref 0.2–1.2)
Total Protein: 7.3 g/dL (ref 6.0–8.3)

## 2018-12-17 ENCOUNTER — Ambulatory Visit (INDEPENDENT_AMBULATORY_CARE_PROVIDER_SITE_OTHER): Payer: BC Managed Care – PPO | Admitting: Gastroenterology

## 2018-12-17 ENCOUNTER — Other Ambulatory Visit: Payer: Self-pay

## 2018-12-17 ENCOUNTER — Encounter: Payer: Self-pay | Admitting: Gastroenterology

## 2018-12-17 VITALS — BP 142/84 | HR 55 | Temp 97.5°F | Ht 67.5 in | Wt 212.4 lb

## 2018-12-17 DIAGNOSIS — R945 Abnormal results of liver function studies: Secondary | ICD-10-CM | POA: Diagnosis not present

## 2018-12-17 DIAGNOSIS — L299 Pruritus, unspecified: Secondary | ICD-10-CM

## 2018-12-17 DIAGNOSIS — K760 Fatty (change of) liver, not elsewhere classified: Secondary | ICD-10-CM | POA: Diagnosis not present

## 2018-12-17 DIAGNOSIS — R7989 Other specified abnormal findings of blood chemistry: Secondary | ICD-10-CM

## 2018-12-17 MED ORDER — CHOLESTYRAMINE LIGHT 4 G PO PACK: 4.0000 g | PACK | Freq: Two times a day (BID) | ORAL | 3 refills | Status: DC

## 2018-12-17 NOTE — Patient Instructions (Addendum)
Cholestyramine powder 4 g p.o. twice daily.  Dispense 1 month with 3 refills.  Continue taking urso once daily (as you have been taking).  After your labs are back (AMA, ASMA) we will contact you.  Thank you for entrusting me with your care and choosing [facility].  Dr Ardis Hughs

## 2018-12-17 NOTE — Progress Notes (Signed)
Review of pertinent gastrointestinal problems: 1. Incidental pancreatic cyst:  cystic lesion pancreatic head noted 09/2011; EUS 4.1cm multiloculated without concerning mophrology; Cytology negative CEA less than 0.2ng/mL Amylase 42 U/L;  EUS 09/2011 43m max MRI 01/2013: 323mby 2222m MRI 07/2013 40m98m 33mm10mCT 11/2014: 40mm 52m3mm b86mmm.  77m/2017: 46mm by 84m by 361m   MR41m/2017 49 mm x 35 mm x 41 mm. Whipple surgery 2018.  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  HPI: This is a very pleasant 49 year old70oman who was referred to me by Dr. Byerly for Barry Dienes liver tests  Chief complaint is pruritus, elevated liver tests  Her liver tests have been trending upward.  2 years ago her alk phos was 338, last week her alk phos was 540.  2 years ago her AST and ALT were completely normal.  Last week her AST was 93 and her ALT was 105.  Total bilirubin has been normal.  August 2020 ANA negative.  Still awaiting AMA and anti-smooth muscle antibody lab results.  I actually last saw her December 2017 for evaluation of her slowly growing pancreatic cyst.  The cyst was growing and I eventually referred her to [place] discuss possible Whipple.  She ended up having the Whipple July 2018, pathology proved that this indeed was a serous cystadenoma, final pathologic size 4.5 cm.  For about a year she has had a gradually worsening pruritus.  This led to liver testing which showed a slightly elevated alk phos that has been rising.  No weight loss, no abdominal pains.    She was started on Urso 300 mg 3 times daily however that made her nauseous and she is only been actually taking it once daily.  Old Data Reviewed:  MRI July 2020: 1. Large geographic region of fatty infiltration involving the entire LEFT hepatic lobe and a portion of RIGHT hepatic lobe with sparing of the posterior RIGHT hepatic lobe. 2. No intrahepatic biliary duct dilatation. Fusiform  dilatation of the distal LEFT ductal system is favored postsurgical and benign 3. No enhancing hepatic lesion. 4. Post partial pancreatectomy without complicating features. 5. Bilateral partial nephrectomies without evidence local recurrence.    Review of systems: Pertinent positive and negative review of systems were noted in the above HPI section. All other review negative.   Past Medical History:  Diagnosis Date  . Anemia 01-02-12   mostly low iron- tx. oral meds, not in several yrs  . Chronic kidney disease 01-02-12   dx. rt. kidney neoplasm-surgery planned, past hx. protein in urine.  . Clear cell renal cell carcinoma ([facility]) 2013, [place]   clear cell renal cell carcinoma-bilateral cyst removed 15-20% of kidneys  . Depression   . Family history of anesthesia complication    mother slow to wake up   . GERD (gastroesophageal reflux disease)   . Leg swelling 01-02-12   occ. episodes of pitting edema lower extremities at end of day.  . Preeclampsia 1998  . Pregnancy induced hypertension 1998  . Sleep apnea    "dx'd; used mask for ~ 1 yr; lost weight; no longer wear mask" (12/21/2016)  . Thyroid enlargement 01/02/2012   left thyroid enlargement, being watched, S/P US; no tx. [place] Past Surgical History:  Procedure Laterality Date  . BREATH TEK H PYLORI  09/20/2011   Procedure: BREATH TEK H PYLORI;  Surgeon: David H NewShann Medaltion: [facility]  Service:  General;  Laterality: N/A;  . childbirth  01-02-12   x2 NVD- pre-exclampsia with pregrancy  . CHOLECYSTECTOMY  11/23/2016   'w/whipple"  . EUS  12/07/2011   Procedure: UPPER ENDOSCOPIC ULTRASOUND (EUS) LINEAR;  Surgeon: Milus Banister, MD;  Location: [facility];  Service: Endoscopy;  Laterality: N/A;  radial linear  . IR RADIOLOGIST EVAL & MGMT  01/11/2017  . ROBOTIC ASSITED PARTIAL NEPHRECTOMY Left 10/02/2013   Procedure: ROBOTIC ASSITED PARTIAL NEPHRECTOMY;  Surgeon: Raynelle Bring, MD;  Location: [facility];  Service: Urology;   Laterality: Left;  . ROBOTIC ASSITED PARTIAL NEPHRECTOMY Right 12/2011  . VAGINAL HYSTERECTOMY  2008  . WHIPPLE PROCEDURE N/A 11/23/2016   Procedure: WHIPPLE PROCEDURE;  Surgeon: Stark Klein, MD;  Location: [place];  Service: General;  Laterality: N/A;    Current Outpatient Medications  Medication Sig Dispense Refill  . alum & mag hydroxide-simeth (MAALOX/MYLANTA) 200-200-20 MG/5ML suspension Take 15 mLs by mouth every 6 (six) hours as needed for indigestion or heartburn.    Marland Kitchen amoxicillin-clavulanate (AUGMENTIN) 875-125 MG tablet Take 1 tablet by mouth every 12 (twelve) hours. 28 tablet 1  . citalopram (CELEXA) 20 MG tablet Take 20 mg by mouth daily with breakfast.     . fluconazole (DIFLUCAN) 200 MG tablet Take 1 tablet (200 mg total) by mouth daily. 14 tablet 0  . ibuprofen (ADVIL,MOTRIN) 200 MG tablet Take 400-600 mg by mouth every 8 (eight) hours as needed for headache or mild pain.    . methocarbamol (ROBAXIN) 500 MG tablet Take 1 tablet (500 mg total) by mouth every 6 (six) hours as needed for muscle spasms. 30 tablet 1  . oxyCODONE (OXY IR/ROXICODONE) 5 MG immediate release tablet Take 1-3 tablets (5-15 mg total) by mouth every 2 (two) hours as needed for moderate pain, severe pain or breakthrough pain. (Patient taking differently: Take 2-10 mg by mouth every three (3) days as needed for moderate pain, severe pain or breakthrough pain. ) 80 tablet 0  . oxyCODONE (OXY IR/ROXICODONE) 5 MG immediate release tablet Take 1-2 tablets (5-10 mg total) by mouth every 4 (four) hours as needed for moderate pain or breakthrough pain. 20 tablet 0   No current facility-administered medications for this visit.     Allergies as of 12/17/2018  . (No Known Allergies)    Family History  Problem Relation Age of Onset  . Cancer Paternal Uncle        lung  . AAA (abdominal aortic aneurysm) Maternal Grandfather        60s-70s    Social History   Socioeconomic History  . Marital status: Married     Spouse name: Not on file  . Number of children: 2  . Years of education: Not on file  . Highest education level: Not on file  Occupational History  . Occupation: Product manager: [employer]  . Financial resource strain: Not on file  . Food insecurity    Worry: Not on file    Inability: Not on file  . Transportation needs    Medical: Not on file    Non-medical: Not on file  Tobacco Use  . Smoking status: Never Smoker  . Smokeless tobacco: Never Used  Substance and Sexual Activity  . Alcohol use: Yes    Comment: 12/21/2016 "beer twice/month; if that"  . Drug use: No  . Sexual activity: Not Currently  Lifestyle  . Physical activity    Days per week: Not on  file    Minutes per session: Not on file  . Stress: Not on file  Relationships  . Social Herbalist on phone: Not on file    Gets together: Not on file    Attends religious service: Not on file    Active member of club or organization: Not on file    Attends meetings of clubs or organizations: Not on file    Relationship status: Not on file  . Intimate partner violence    Fear of current or ex partner: Not on file    Emotionally abused: Not on file    Physically abused: Not on file    Forced sexual activity: Not on file  Other Topics Concern  . Not on file  Social History Narrative  . Not on file     Physical Exam: BP (!) 142/84 (BP Location: Left Arm, Patient Position: Sitting, Cuff Size: Large)   Pulse (!) 55   Temp (!) 97.5 F (36.4 C) (Oral)   Ht 5' 7.5" (1.715 m)   Wt 212 lb 6 oz (96.3 kg)   SpO2 98%   BMI 32.77 kg/m  Constitutional: generally well-appearing Psychiatric: alert and oriented x3 Eyes: extraocular movements intact Mouth: oral pharynx moist, no lesions Neck: supple no lymphadenopathy Cardiovascular: heart regular rate and rhythm Lungs: clear to auscultation bilaterally Abdomen: soft, nontender, nondistended, no obvious ascites, no peritoneal  signs, normal bowel sounds Extremities: no lower extremity edema bilaterally Skin: no lesions on visible extremities   Assessment and plan: 48 y.o. female with elevated liver tests, pruritus, Whipple surgery 2017 for a benign cystic tumor  First it is not clear to me if she is having delayed complication from her Whipple surgery or whether she has a primary liver problem developing.  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  I am going to start her on cholestyramine which can be very helpful for the pruritus of cholestasis.  4 g twice daily for now.  She is on Urso but is only taking 300 mg once daily.  She has some pending lab test to see if she has underlying cholestatic chronic disease such as primary biliary cholangitis.  If those labs are indicative of PBC then the treatment is much higher dose Urso, at her weight it would be about 1500 mg daily in divided doses.  She was nauseous at 900 mg daily and I suspect 1500 mg daily will also make her nauseous and so I would likely also start her on chronic daily antinausea medicine.   Please see the "Patient Instructions" section for addition details about the plan.   Owens Loffler, MD [facility] 12/17/2018, 10:47 AM  Cc: Jasmine Awe., MD

## 2018-12-18 LAB — ANA: Anti Nuclear Antibody (ANA): NEGATIVE

## 2018-12-18 LAB — MITOCHONDRIAL ANTIBODIES: Mitochondrial M2 Ab, IgG: <=20 U

## 2018-12-18 LAB — ANTI-SMOOTH MUSCLE ANTIBODY, IGG: Actin (Smooth Muscle) Antibody (IGG): <20 U (ref <20)

## 2018-12-20 ENCOUNTER — Other Ambulatory Visit: Payer: Self-pay | Admitting: Gastroenterology

## 2018-12-20 ENCOUNTER — Telehealth: Payer: Self-pay | Admitting: Gastroenterology

## 2018-12-20 MED ORDER — CHOLESTYRAMINE LIGHT 4 G PO PACK: 4.0000 g | PACK | Freq: Three times a day (TID) | ORAL | 3 refills | Status: DC

## 2018-12-20 NOTE — Telephone Encounter (Signed)
Pt would like to know if she can increase dose of Prevalite if she is not seeing results.

## 2018-12-20 NOTE — Telephone Encounter (Signed)
Spoke to patient who states she is still having a lot of itching. Dr Ardis Hughs notified and changed prescription to 3 times daily.

## 2018-12-31 ENCOUNTER — Other Ambulatory Visit: Payer: Self-pay | Admitting: Gastroenterology

## 2018-12-31 ENCOUNTER — Telehealth: Payer: Self-pay | Admitting: Gastroenterology

## 2018-12-31 HISTORY — PX: LIVER BIOPSY: SHX301

## 2018-12-31 MED ORDER — URSODIOL 300 MG PO CAPS: 300.0000 mg | ORAL_CAPSULE | Freq: Every day | ORAL | 3 refills | Status: DC

## 2018-12-31 NOTE — Telephone Encounter (Signed)
Refill for Ursodiol 300 mg daily sent to pharmacy. Patient notified.

## 2019-01-01 ENCOUNTER — Other Ambulatory Visit (HOSPITAL_COMMUNITY): Payer: Self-pay | Admitting: Nurse Practitioner

## 2019-01-01 DIAGNOSIS — R7401 Elevation of levels of liver transaminase levels: Secondary | ICD-10-CM

## 2019-01-01 DIAGNOSIS — R772 Abnormality of alphafetoprotein: Secondary | ICD-10-CM

## 2019-01-01 DIAGNOSIS — R748 Abnormal levels of other serum enzymes: Secondary | ICD-10-CM

## 2019-01-02 ENCOUNTER — Other Ambulatory Visit: Payer: Self-pay | Admitting: Radiology

## 2019-01-02 ENCOUNTER — Other Ambulatory Visit: Payer: Self-pay | Admitting: Physician Assistant

## 2019-01-03 ENCOUNTER — Ambulatory Visit (HOSPITAL_COMMUNITY)
Admission: RE | Admit: 2019-01-03 | Discharge: 2019-01-03 | Disposition: A | Discharge status: Home / Self Care | Payer: BC Managed Care – PPO | Source: Ambulatory Visit | Attending: Nurse Practitioner | Admitting: Nurse Practitioner

## 2019-01-03 ENCOUNTER — Other Ambulatory Visit: Payer: Self-pay

## 2019-01-03 DIAGNOSIS — Z9049 Acquired absence of other specified parts of digestive tract: Secondary | ICD-10-CM | POA: Diagnosis not present

## 2019-01-03 DIAGNOSIS — Z801 Family history of malignant neoplasm of trachea, bronchus and lung: Secondary | ICD-10-CM | POA: Diagnosis not present

## 2019-01-03 DIAGNOSIS — F329 Major depressive disorder, single episode, unspecified: Secondary | ICD-10-CM | POA: Diagnosis not present

## 2019-01-03 DIAGNOSIS — N189 Chronic kidney disease, unspecified: Secondary | ICD-10-CM | POA: Insufficient documentation

## 2019-01-03 DIAGNOSIS — R740 Nonspecific elevation of levels of transaminase and lactic acid dehydrogenase [LDH]: Secondary | ICD-10-CM | POA: Insufficient documentation

## 2019-01-03 DIAGNOSIS — Z905 Acquired absence of kidney: Secondary | ICD-10-CM | POA: Insufficient documentation

## 2019-01-03 DIAGNOSIS — G4730 Sleep apnea, unspecified: Secondary | ICD-10-CM | POA: Insufficient documentation

## 2019-01-03 DIAGNOSIS — R772 Abnormality of alphafetoprotein: Secondary | ICD-10-CM | POA: Insufficient documentation

## 2019-01-03 DIAGNOSIS — Z85528 Personal history of other malignant neoplasm of kidney: Secondary | ICD-10-CM | POA: Diagnosis not present

## 2019-01-03 DIAGNOSIS — R7401 Elevation of levels of liver transaminase levels: Secondary | ICD-10-CM

## 2019-01-03 DIAGNOSIS — Z79899 Other long term (current) drug therapy: Secondary | ICD-10-CM | POA: Insufficient documentation

## 2019-01-03 DIAGNOSIS — R748 Abnormal levels of other serum enzymes: Secondary | ICD-10-CM | POA: Insufficient documentation

## 2019-01-03 DIAGNOSIS — Z8249 Family history of ischemic heart disease and other diseases of the circulatory system: Secondary | ICD-10-CM | POA: Insufficient documentation

## 2019-01-03 LAB — CBC
HCT: 39.5 % (ref 36.0–46.0)
Hemoglobin: 12.4 g/dL (ref 12.0–15.0)
MCH: 26.8 pg (ref 26.0–34.0)
MCHC: 31.4 g/dL (ref 30.0–36.0)
MCV: 85.5 fL (ref 80.0–100.0)
Platelets: 358 10*3/uL (ref 150–400)
RBC: 4.62 MIL/uL (ref 3.87–5.11)
RDW: 15.8 % — ABNORMAL HIGH (ref 11.5–15.5)
WBC: 7.8 10*3/uL (ref 4.0–10.5)
nRBC: 0 % (ref 0.0–0.2)

## 2019-01-03 LAB — PROTIME-INR
INR: 1 (ref 0.8–1.2)
Prothrombin Time: 12.8 seconds (ref 11.4–15.2)

## 2019-01-03 MED ORDER — MIDAZOLAM HCL 2 MG/2ML IJ SOLN
INTRAMUSCULAR | Status: AC | PRN
  Administered 2019-01-03: 0.5 mg via INTRAVENOUS
  Administered 2019-01-03: 1 mg via INTRAVENOUS

## 2019-01-03 MED ORDER — GELATIN ABSORBABLE 12-7 MM EX MISC
CUTANEOUS | Status: AC
  Filled 2019-01-03: qty 1

## 2019-01-03 MED ORDER — FENTANYL CITRATE (PF) 100 MCG/2ML IJ SOLN
INTRAMUSCULAR | Status: AC
  Filled 2019-01-03: qty 2

## 2019-01-03 MED ORDER — FENTANYL CITRATE (PF) 100 MCG/2ML IJ SOLN
INTRAMUSCULAR | Status: AC | PRN
  Administered 2019-01-03: 50 ug via INTRAVENOUS
  Administered 2019-01-03: 25 ug via INTRAVENOUS

## 2019-01-03 MED ORDER — SODIUM CHLORIDE 0.9 % IV SOLN: INTRAVENOUS | Status: DC

## 2019-01-03 MED ORDER — MIDAZOLAM HCL 2 MG/2ML IJ SOLN
INTRAMUSCULAR | Status: AC
  Filled 2019-01-03: qty 2

## 2019-01-03 MED ORDER — LIDOCAINE HCL (PF) 1 % IJ SOLN
INTRAMUSCULAR | Status: AC
  Filled 2019-01-03: qty 30

## 2019-01-03 NOTE — Procedures (Signed)
Interventional Radiology Procedure Note  Procedure: US Guided Biopsy of Liver  Complications: None  Estimated Blood Loss: < 10 mL  Findings: 18 G core biopsy of right lobe of liver performed under US guidance.  Two core samples obtained and sent to Pathology.  Venetia Night. Kathlene Cote, M.D Pager:  [phone]

## 2019-01-03 NOTE — H&P (Signed)
Chief Complaint: Patient was seen in consultation today for random liver biopsy at the request of Drazek,Dawn  Referring Physician(s): Drazek,Dawn  Supervising Physician: Aletta Edouard  Patient Status: [facility] - Out-pt  History of Present Illness: Krystal Reynolds is a 48 y.o. female being worked up for elevated LFTs/AFP. She is see by Roosevelt Locks NP and is referred for US guided random liver core biopsy. PMHx, meds, labs, imaging, allergies reviewed. Feels well, no recent fevers, chills, illness. Has been NPO today as directed. Family at bedside.   Past Medical History:  Diagnosis Date  . Anemia 01-02-12   mostly low iron- tx. oral meds, not in several yrs  . Chronic kidney disease 01-02-12   dx. rt. kidney neoplasm-surgery planned, past hx. protein in urine.  . 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 Depression   . Family history of anesthesia complication    mother slow to wake up   . GERD (gastroesophageal reflux disease)   . Leg swelling 01-02-12   occ. episodes of pitting edema lower extremities at end of day.  . Preeclampsia 1998  . Pregnancy induced hypertension 1998  . Sleep apnea    "dx'd; used mask for ~ 1 yr; lost weight; no longer wear mask" (12/21/2016)  . Thyroid enlargement 01/02/2012   left thyroid enlargement, being watched, S/P [place]; no tx.    Past Surgical History:  Procedure Laterality Date  . BREATH TEK H PYLORI  09/20/2011   Procedure: BREATH TEK H PYLORI;  Surgeon: Shann Medal, MD;  Location: [facility];  Service: General;  Laterality: N/A;  . childbirth  01-02-12   x2 NVD- pre-exclampsia with pregrancy  . CHOLECYSTECTOMY  11/23/2016   'w/whipple"  . EUS  12/07/2011   Procedure: UPPER ENDOSCOPIC ULTRASOUND (EUS) LINEAR;  Surgeon: Milus Banister, MD;  Location: [facility];  Service: Endoscopy;  Laterality: N/A;  radial linear  . IR RADIOLOGIST EVAL & MGMT   01/11/2017  . ROBOTIC ASSITED PARTIAL NEPHRECTOMY Left 10/02/2013   Procedure: ROBOTIC ASSITED PARTIAL NEPHRECTOMY;  Surgeon: Raynelle Bring, MD;  Location: [facility];  Service: Urology;  Laterality: Left;  . ROBOTIC ASSITED PARTIAL NEPHRECTOMY Right 12/2011  . VAGINAL HYSTERECTOMY  2008  . WHIPPLE PROCEDURE N/A 11/23/2016   Procedure: WHIPPLE PROCEDURE;  Surgeon: Stark Klein, MD;  Location: [facility];  Service: General;  Laterality: N/A;    Allergies: Patient has no known allergies.  Medications: Prior to Admission medications   Medication Sig Start Date End Date Taking? Authorizing Provider  cholestyramine light (PREVALITE) 4 g packet Take 1 packet (4 g total) by mouth 3 (three) times daily. 12/20/18  Yes Milus Banister, MD  citalopram (CELEXA) 20 MG tablet Take 20 mg by mouth daily with breakfast.    Yes [provider]  ibuprofen (ADVIL,MOTRIN) 200 MG tablet Take 400-600 mg by mouth every 8 (eight) hours as needed for headache or mild pain.   Yes [provider]  ursodiol (ACTIGALL) 300 MG capsule Take 1 capsule (300 mg total) by mouth daily. 12/31/18  Yes Milus Banister, MD  vitamin E 400 UNIT capsule Take 400 Units by mouth daily.   Yes [provider]     Family History  Problem Relation Age of Onset  . Cancer Paternal Uncle        lung  . AAA (abdominal aortic aneurysm) Maternal Grandfather  60s-70s    Social History   Socioeconomic History  . Marital status: Married    Spouse name: Not on file  . Number of children: 2  . Years of education: Not on file  . Highest education level: Not on file  Occupational History  . Occupation: Product manager: [employer]  . Financial resource strain: Not on file  . Food insecurity    Worry: Not on file    Inability: Not on file  . Transportation needs    Medical: Not on file    Non-medical: Not on file  Tobacco Use  . Smoking status: Never Smoker  . Smokeless tobacco:  Never Used  Substance and Sexual Activity  . Alcohol use: Yes    Comment: 12/21/2016 "beer twice/month; if that"  . Drug use: No  . Sexual activity: Not Currently  Lifestyle  . Physical activity    Days per week: Not on file    Minutes per session: Not on file  . Stress: Not on file  Relationships  . Social Herbalist on phone: Not on file    Gets together: Not on file    Attends religious service: Not on file    Active member of club or organization: Not on file    Attends meetings of clubs or organizations: Not on file    Relationship status: Not on file  Other Topics Concern  . Not on file  Social History Narrative  . Not on file    Review of Systems: A 12 point ROS discussed and pertinent positives are indicated in the HPI above.  All other systems are negative.  Review of Systems  Vital Signs: BP (!) 172/80   Pulse 61   Ht 5\' 7"  (1.702 m)   Wt 96.2 kg   SpO2 100%   BMI 33.20 kg/m   Physical Exam HENT:     Head: Normocephalic.     Mouth/Throat:     Mouth: Mucous membranes are moist.     Pharynx: Oropharynx is clear.  Cardiovascular:     Rate and Rhythm: Normal rate and regular rhythm.     Heart sounds: Normal heart sounds.  Pulmonary:     Effort: Pulmonary effort is normal. No respiratory distress.     Breath sounds: Normal breath sounds.  Abdominal:     General: Abdomen is flat. There is no distension.     Palpations: Abdomen is soft. There is no mass.     Tenderness: There is no abdominal tenderness.  Skin:    General: Skin is warm and dry.     Coloration: Skin is not jaundiced.  Neurological:     General: No focal deficit present.     Mental Status: She is alert and oriented to person, place, and time.  Psychiatric:        Mood and Affect: Mood normal.        Judgment: Judgment normal.     Imaging: No results found.  Labs:  CBC: Recent Labs    01/03/19 1259  WBC 7.8  HGB 12.4  HCT 39.5  PLT 358    COAGS: No results for  input(s): INR, APTT in the last 8760 hours.  BMP: No results for input(s): NA, K, CL, CO2, GLUCOSE, BUN, CALCIUM, CREATININE, GFRNONAA, GFRAA in the last 8760 hours.  Invalid input(s): CMP  LIVER FUNCTION TESTS: Recent Labs    12/13/18 1657  BILITOT 0.5  AST 93*  ALT 105*  ALKPHOS 540*  PROT 7.3  ALBUMIN 4.1    TUMOR MARKERS: No results for input(s): AFPTM, CEA, CA199, CHROMGRNA in the last 8760 hours.  Assessment and Plan: Elevated AFP/LFTs For US guided random liver core biopsy. Labs pending Risks and benefits of liver biopsy was discussed with the patient and/or patient's family including, but not limited to bleeding, infection, damage to adjacent structures or low yield requiring additional tests.  All of the questions were answered and there is agreement to proceed.  Consent signed and in chart.    Thank you for this interesting consult.  I greatly enjoyed meeting KYMBREE BAS and look forward to participating in their care.  A copy of this report was sent to the requesting provider on this date.  Electronically Signed: Ascencion Dike, PA-C 01/03/2019, 1:15 PM   I spent a total of 20 minutes in face to face in clinical consultation, greater than 50% of which was counseling/coordinating care for

## 2019-01-03 NOTE — Discharge Instructions (Signed)
Liver Biopsy, Care After °These instructions give you information on caring for yourself after your procedure. Your doctor may also give you more specific instructions. Call your doctor if you have any problems or questions after your procedure. °What can I expect after the procedure? °After the procedure, it is common to have: °· Pain and soreness where the biopsy was done. °· Bruising around the area where the biopsy was done. °· Sleepiness and be tired for a few days. °Follow these instructions at home: °Medicines °· Take over-the-counter and prescription medicines only as told by your doctor. °· If you were prescribed an antibiotic medicine, take it as told by your doctor. Do not stop taking the antibiotic even if you start to feel better. °· Do not take medicines such as aspirin and ibuprofen. These medicines can thin your blood. Do not take these medicines unless your doctor tells you to take them. °· If you are taking prescription pain medicine, take actions to prevent or treat constipation. Your doctor may recommend that you: °? Drink enough fluid to keep your pee (urine) clear or pale yellow. °? Take over-the-counter or prescription medicines. °? Eat foods that are high in fiber, such as fresh fruits and vegetables, whole grains, and beans. °? Limit foods that are high in fat and processed sugars, such as fried and sweet foods. °Caring for your cut °· Follow instructions from your doctor about how to take care of your cuts from surgery (incisions). Make sure you: °? Wash your hands with soap and water before you change your bandage (dressing). If you cannot use soap and water, use hand sanitizer. °? Change your bandage as told by your doctor. °? Leave stitches (sutures), skin glue, or skin tape (adhesive) strips in place. They may need to stay in place for 2 weeks or longer. If tape strips get loose and curl up, you may trim the loose edges. Do not remove tape strips completely unless your doctor says it is  okay. °· Check your cuts every day for signs of infection. Check for: °? Redness, swelling, or more pain. °? Fluid or blood. °? Pus or a bad smell. °? Warmth. °· Do not take baths, swim, or use a hot tub until your doctor says it is okay to do so. °Activity ° °· Rest at home for 1-2 days or as told by your doctor. °? Avoid sitting for a long time without moving. Get up to take short walks every 1-2 hours. °· Return to your normal activities as told by your doctor. Ask what activities are safe for you. °· Do not do these things in the first 24 hours: °? Drive. °? Use machinery. °? Take a bath or shower. °· Do not lift more than 10 pounds (4.5 kg) or play contact sports for the first 2 weeks. °General instructions ° °· Do not drink alcohol in the first week after the procedure. °· Have someone stay with you for at least 24 hours after the procedure. °· Get your test results. Ask your doctor or the department that is doing the test: °? When will my results be ready? °? How will I get my results? °? What are my treatment options? °? What other tests do I need? °? What are my next steps? °· Keep all follow-up visits as told by your doctor. This is important. °Contact a doctor if: °· A cut bleeds and leaves more than just a small spot of blood. °· A cut is red, puffs up (  swells), or hurts more than before. °· Fluid or something else comes from a cut. °· A cut smells bad. °· You have a fever or chills. °Get help right away if: °· You have swelling, bloating, or pain in your belly (abdomen). °· You get dizzy or faint. °· You have a rash. °· You feel sick to your stomach (nauseous) or throw up (vomit). °· You have trouble breathing, feel short of breath, or feel faint. °· Your chest hurts. °· You have problems talking or seeing. °· You have trouble with your balance or moving your arms or legs. °Summary °· After the procedure, it is common to have pain, soreness, bruising, and tiredness. °· Your doctor will tell you how to  take care of yourself at home. Change your bandage, take your medicines, and limit your activities as told by your doctor. °· Call your doctor if you have symptoms of infection. Get help right away if your belly swells, your cut bleeds a lot, or you have trouble talking or breathing. °This information is not intended to replace advice given to you by your health care provider. Make sure you discuss any questions you have with your health care provider. °Document Released: 01/25/2008 Document Revised: 04/27/2017 Document Reviewed: 04/27/2017 °Elsevier Patient Education © 2020 Elsevier Inc[employer] °Moderate Conscious Sedation, Adult, Care After °These instructions provide you with information about caring for yourself after your procedure. Your health care provider may also give you more specific instructions. Your treatment has been planned according to current medical practices, but problems sometimes occur. Call your health care provider if you have any problems or questions after your procedure. °What can I expect after the procedure? °After your procedure, it is common: °· To feel sleepy for several hours. °· To feel clumsy and have poor balance for several hours. °· To have poor judgment for several hours. °· To vomit if you eat too soon. °Follow these instructions at home: °For at least 24 hours after the procedure: ° °· Do not: °? Participate in activities where you could fall or become injured. °? Drive. °? Use heavy machinery. °? Drink alcohol. °? Take sleeping pills or medicines that cause drowsiness. °? Make important decisions or sign legal documents. °? Take care of children on your own. °· Rest. °Eating and drinking °· Follow the diet recommended by your health care provider. °· If you vomit: °? Drink water, juice, or soup when you can drink without vomiting. °? Make sure you have little or no nausea before eating solid foods. °General instructions °· Have a responsible adult stay with you until you are awake  and alert. °· Take over-the-counter and prescription medicines only as told by your health care provider. °· If you smoke, do not smoke without supervision. °· Keep all follow-up visits as told by your health care provider. This is important. °Contact a health care provider if: °· You keep feeling nauseous or you keep vomiting. °· You feel light-headed. °· You develop a rash. °· You have a fever. °Get help right away if: °· You have trouble breathing. °This information is not intended to replace advice given to you by your health care provider. Make sure you discuss any questions you have with your health care provider. °Document Released: 02/05/2013 Document Revised: 03/30/2017 Document Reviewed: 08/07/2015 °Elsevier Patient Education © 2020 Elsevier Inc. ° °

## 2019-01-06 ENCOUNTER — Emergency Department (HOSPITAL_COMMUNITY)
Admission: EM | Admit: 2019-01-06 | Discharge: 2019-01-06 | Disposition: A | Discharge status: Home / Self Care | Payer: BC Managed Care – PPO | Attending: Emergency Medicine | Admitting: Emergency Medicine

## 2019-01-06 ENCOUNTER — Emergency Department (HOSPITAL_COMMUNITY): Payer: BC Managed Care – PPO

## 2019-01-06 ENCOUNTER — Encounter (HOSPITAL_COMMUNITY): Payer: Self-pay | Admitting: Emergency Medicine

## 2019-01-06 DIAGNOSIS — Z905 Acquired absence of kidney: Secondary | ICD-10-CM | POA: Insufficient documentation

## 2019-01-06 DIAGNOSIS — Z9889 Other specified postprocedural states: Secondary | ICD-10-CM | POA: Diagnosis not present

## 2019-01-06 DIAGNOSIS — Z79899 Other long term (current) drug therapy: Secondary | ICD-10-CM | POA: Diagnosis not present

## 2019-01-06 DIAGNOSIS — R509 Fever, unspecified: Secondary | ICD-10-CM | POA: Insufficient documentation

## 2019-01-06 DIAGNOSIS — Z85528 Personal history of other malignant neoplasm of kidney: Secondary | ICD-10-CM | POA: Diagnosis not present

## 2019-01-06 LAB — COMPREHENSIVE METABOLIC PANEL
ALT: 73 U/L — ABNORMAL HIGH (ref 0–44)
AST: 43 U/L — ABNORMAL HIGH (ref 15–41)
Albumin: 3.4 g/dL — ABNORMAL LOW (ref 3.5–5.0)
Alkaline Phosphatase: 582 U/L — ABNORMAL HIGH (ref 38–126)
Anion gap: 10 (ref 5–15)
BUN: 11 mg/dL (ref 6–20)
CO2: 24 mmol/L (ref 22–32)
Calcium: 9.1 mg/dL (ref 8.9–10.3)
Chloride: 101 mmol/L (ref 98–111)
Creatinine, Ser: 0.91 mg/dL (ref 0.44–1.00)
GFR calc Af Amer: >60 mL/min (ref >60)
GFR calc non Af Amer: >60 mL/min (ref >60)
Glucose, Bld: 91 mg/dL (ref 70–99)
Potassium: 4 mmol/L (ref 3.5–5.1)
Sodium: 135 mmol/L (ref 135–145)
Total Bilirubin: 0.8 mg/dL (ref 0.3–1.2)
Total Protein: 7.2 g/dL (ref 6.5–8.1)

## 2019-01-06 LAB — URINALYSIS, ROUTINE W REFLEX MICROSCOPIC
Bilirubin Urine: NEGATIVE
Glucose, UA: NEGATIVE mg/dL
Hgb urine dipstick: NEGATIVE
Ketones, ur: NEGATIVE mg/dL
Leukocytes,Ua: NEGATIVE
Nitrite: NEGATIVE
Protein, ur: NEGATIVE mg/dL
Specific Gravity, Urine: 1.004 — ABNORMAL LOW (ref 1.005–1.030)
pH: 6 (ref 5.0–8.0)

## 2019-01-06 LAB — CBC WITH DIFFERENTIAL/PLATELET
Abs Immature Granulocytes: 0.06 10*3/uL (ref 0.00–0.07)
Basophils Absolute: 0 10*3/uL (ref 0.0–0.1)
Basophils Relative: 0 %
Eosinophils Absolute: 0.1 10*3/uL (ref 0.0–0.5)
Eosinophils Relative: 1 %
HCT: 37.5 % (ref 36.0–46.0)
Hemoglobin: 11.8 g/dL — ABNORMAL LOW (ref 12.0–15.0)
Immature Granulocytes: 1 %
Lymphocytes Relative: 10 %
Lymphs Abs: 1.1 10*3/uL (ref 0.7–4.0)
MCH: 27 pg (ref 26.0–34.0)
MCHC: 31.5 g/dL (ref 30.0–36.0)
MCV: 85.8 fL (ref 80.0–100.0)
Monocytes Absolute: 1 10*3/uL (ref 0.1–1.0)
Monocytes Relative: 9 %
Neutro Abs: 8.9 10*3/uL — ABNORMAL HIGH (ref 1.7–7.7)
Neutrophils Relative %: 79 %
Platelets: 284 10*3/uL (ref 150–400)
RBC: 4.37 MIL/uL (ref 3.87–5.11)
RDW: 15.8 % — ABNORMAL HIGH (ref 11.5–15.5)
WBC: 11.2 10*3/uL — ABNORMAL HIGH (ref 4.0–10.5)
nRBC: 0 % (ref 0.0–0.2)

## 2019-01-06 LAB — I-STAT BETA HCG BLOOD, ED (MC, WL, AP ONLY): I-stat hCG, quantitative: <5 m[IU]/mL (ref <5)

## 2019-01-06 LAB — LIPASE, BLOOD: Lipase: 26 U/L (ref 11–51)

## 2019-01-06 MED ORDER — IOHEXOL 300 MG/ML  SOLN
80.0000 mL | Freq: Once | INTRAMUSCULAR | Status: AC | PRN
  Administered 2019-01-06: 80 mL via INTRAVENOUS

## 2019-01-06 NOTE — ED Provider Notes (Signed)
[facility] EMERGENCY DEPARTMENT Provider Note   CSN: [id number] Arrival date & time: 01/06/19  1430     History   Chief Complaint Chief Complaint  Patient presents with   Fever    HPI Krystal Reynolds is a 48 y.o. female.     HPI   48 year old female presents today with fever.  Patient has a history of slowly elevating LFTs.  She is currently followed by gastroenterology.  Uncertain etiology for the elevated LFTs although she does have fatty liver disease.  She also status post Whipple.  Patient underwent liver core biopsy 3 days ago performed by interventional radiology.  There were no complications at this procedure, she went home and developed a fever later on in the evening.  Patient denies any cough rhinorrhea nasal congestion, shortness of breath, she denies any abdominal pain, bruising or bleeding, no urinary symptoms.  She was seen at another emergency room today who performed Cobra testing and chest x-ray, the chest x-ray was normal code testing pending.    Past Medical History:  Diagnosis Date   Anemia 01-02-12   mostly low iron- tx. oral meds, not in several yrs   Chronic kidney disease 01-02-12   dx. rt. kidney neoplasm-surgery planned, past hx. protein in urine.   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 episodes of pitting edema lower extremities at end of day.   Preeclampsia 1998   Pregnancy induced hypertension 1998   Sleep apnea    "dx'd; used mask for ~ 1 yr; lost weight; no longer wear mask" (12/21/2016)   Thyroid enlargement 01/02/2012   left thyroid enlargement, being watched, S/P [place]; no tx.    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   Morbid obesity ([place]) 09/07/2011    Past Surgical History:  Procedure Laterality Date   BREATH TEK H PYLORI  09/20/2011   Procedure: BREATH TEK H PYLORI;  Surgeon: Shann Medal, MD;  Location: [facility];  Service: General;  Laterality: N/A;   childbirth  01-02-12   x2 NVD- pre-exclampsia with pregrancy   CHOLECYSTECTOMY  11/23/2016   'w/whipple"   EUS  12/07/2011   Procedure: UPPER ENDOSCOPIC ULTRASOUND (EUS) LINEAR;  Surgeon: Milus Banister, MD;  Location: [facility];  Service: Endoscopy;  Laterality: N/A;  radial linear   IR RADIOLOGIST EVAL & MGMT  01/11/2017   ROBOTIC ASSITED PARTIAL NEPHRECTOMY Left 10/02/2013   Procedure: ROBOTIC ASSITED PARTIAL NEPHRECTOMY;  Surgeon: Raynelle Bring, MD;  Location: [facility];  Service: Urology;  Laterality: Left;   ROBOTIC ASSITED PARTIAL NEPHRECTOMY Right 12/2011   VAGINAL HYSTERECTOMY  2008   WHIPPLE PROCEDURE N/A 11/23/2016   Procedure: WHIPPLE PROCEDURE;  Surgeon: Stark Klein, MD;  Location: [facility];  Service: General;  Laterality: N/A;     OB History   No obstetric history on file.      Home Medications    Prior to Admission medications   Medication Sig Start Date End Date Taking? Authorizing Provider  cholestyramine light (PREVALITE)  4 g packet Take 1 packet (4 g total) by mouth 3 (three) times daily. 12/20/18   Milus Banister, MD  citalopram (CELEXA) 20 MG tablet Take 20 mg by mouth daily with breakfast.     [provider]  ibuprofen (ADVIL,MOTRIN) 200 MG tablet Take 400-600 mg by mouth every 8 (eight) hours as needed for headache or mild pain.    [provider]  ursodiol (ACTIGALL) 300 MG capsule Take 1 capsule (300 mg total) by mouth daily. 12/31/18   Milus Banister, MD  vitamin E 400 UNIT capsule Take 400 Units by mouth daily.    [provider]    Family  History Family History  Problem Relation Age of Onset   Cancer Paternal Uncle        lung   AAA (abdominal aortic aneurysm) Maternal Grandfather        7s-70s    Social History Social History   Tobacco Use   Smoking status: Never Smoker   Smokeless tobacco: Never Used  Substance Use Topics   Alcohol use: Yes    Comment: 12/21/2016 "beer twice/month; if that"   Drug use: No     Allergies   Patient has no known allergies.   Review of Systems Review of Systems  All other systems reviewed and are negative.    Physical Exam Updated Vital Signs BP 134/75    Pulse 72    Temp 98 F (36.7 C) (Oral)    Resp 17    SpO2 99%   Physical Exam Vitals signs and nursing note reviewed.  Constitutional:      Appearance: She is well-developed.  HENT:     Head: Normocephalic and atraumatic.  Eyes:     General: No scleral icterus.       Right eye: No discharge.        Left eye: No discharge.     Conjunctiva/sclera: Conjunctivae normal.     Pupils: Pupils are equal, round, and reactive to light.  Neck:     Musculoskeletal: Normal range of motion.     Vascular: No JVD.     Trachea: No tracheal deviation.  Pulmonary:     Effort: Pulmonary effort is normal.     Breath sounds: No stridor.  Abdominal:     Comments: Small puncture mark on the right flank with no surrounding erythema tenderness or discharge-abdomen is soft and nontender with no distention  Neurological:     Mental Status: She is alert and oriented to person, place, and time.     Coordination: Coordination normal.  Psychiatric:        Behavior: Behavior normal.        Thought Content: Thought content normal.        Judgment: Judgment normal.      ED Treatments / Results  Labs (all labs ordered are listed, but only abnormal results are displayed) Labs Reviewed  COMPREHENSIVE METABOLIC PANEL - Abnormal; Notable for the following components:      Result Value   Albumin 3.4 (*)    AST 43 (*)    ALT 73  (*)    Alkaline Phosphatase 582 (*)    All other components within normal limits  CBC WITH DIFFERENTIAL/PLATELET - Abnormal; Notable for the following components:   WBC 11.2 (*)    Hemoglobin 11.8 (*)    RDW 15.8 (*)    Neutro Abs 8.9 (*)    All other components within normal limits  URINALYSIS, ROUTINE W  REFLEX MICROSCOPIC - Abnormal; Notable for the following components:   Color, Urine STRAW (*)    Specific Gravity, Urine 1.004 (*)    All other components within normal limits  LIPASE, BLOOD  I-STAT BETA HCG BLOOD, ED ([facility], [facility], [facility] ONLY)    EKG None  Radiology Ct Abdomen Pelvis W Contrast  Result Date: 01/06/2019 CLINICAL DATA:  Patient is here due to fever after liver bx on Friday due to elevated enzymes. Patient has had bilateral partial nephrectomies 5 and 7 years ago and also has had Wipple procedure for pancreatic cyst and partial hysterectomy : CT ABDOMEN AND PELVIS WITH CONTRAST TECHNIQUE: Multidetector CT imaging of the abdomen and pelvis was performed using the standard protocol following bolus administration of intravenous contrast. CONTRAST:  38mL OMNIPAQUE IOHEXOL 300 MG/ML  SOLN COMPARISON:  CT abdomen pelvis 01/21/2018, MRI abdomen 11/11/2018 FINDINGS: Lower chest: Minimal bibasilar atelectasis. No pneumothorax or pleural effusion. Hepatobiliary: There are a few scattered too small to characterize hypodensities in the liver. No significant post biopsy changes, hematoma or abscess are identified. Biliary stent remains in place with mild central intrahepatic biliary duct dilation, stable compared to prior. Pancreas: Status post partial pancreatectomy with resection of the pancreatic head. No pancreatic duct dilation. No peripancreatic inflammation. Spleen: Normal in size without focal abnormality. Adrenals/Urinary Tract: Adrenal glands are unremarkable. Surgical changes status post bilateral partial nephrectomies. No hydronephrosis. No renal calculi. Bladder is unremarkable.  Stomach/Bowel: Status post distal gastrectomy. Stable appearance of the gastrojejunostomy and pancreaticojejunostomy. Appendix appears normal. No evidence of bowel wall thickening, distention, or inflammatory changes. Vascular/Lymphatic: Minimal atherosclerotic calcification in the abdominal aorta. Reproductive: Uterus and bilateral adnexa are unremarkable. Other: There is midline diastasis of the abdominal wall. No abdominopelvic ascites. Musculoskeletal: No acute or significant osseous findings. IMPRESSION: No CT finding to explain the patient's fever, specifically no significant post biopsy complication identified in the liver. Aortic Atherosclerosis (ICD10-I70.0). Electronically Signed   By: Audie Pinto M.D.   On: 01/06/2019 18:43    Procedures Procedures (including critical care time)  Medications Ordered in ED Medications  iohexol (OMNIPAQUE) 300 MG/ML solution 80 mL (80 mLs Intravenous Contrast Given 01/06/19 1749)     Initial Impression / Assessment and Plan / ED Course  I have reviewed the triage vital signs and the nursing notes.  Pertinent labs & imaging results that were available during my care of the patient were reviewed by me and considered in my medical decision making (see chart for details).        48 year old female presents today with fever.  Patient is status post liver biopsy.  She has no runs or symptoms that could be consistent with COVID other than the reported fever.  She is afebrile here with no tachycardia.  She is very well-appearing in no acute distress.  She already has covert testing pending as an outpatient.  Given the fact she did have invasive procedure with fever case was discussed with attending physician, we agreed that imaging of the abdomen would be prudent to rule out any sort of developing infection.   CT of the abdomen shows no signs of acute infection.  Patient remains afebrile here with no tachycardia.  Patient stable for outpatient follow-up  return precautions given.  Verbalized understanding and agreement to this plan.  Krystal Reynolds was evaluated in Emergency Department on 01/06/2019 for the symptoms described in the history of present illness. She was evaluated in the context of the global COVID-19 pandemic, which necessitated consideration that  the patient might be at risk for infection with the SARS-CoV-2 virus that causes COVID-19. Institutional protocols and algorithms that pertain to the evaluation of patients at risk for COVID-19 are in a state of rapid change based on information released by regulatory bodies including the CDC and federal and state organizations. These policies and algorithms were followed during the patient's care in the ED.   Final Clinical Impressions(s) / ED Diagnoses   Final diagnoses:  Fever, unspecified fever cause    ED Discharge Orders    None       Francee Gentile 01/06/19 2125    Veryl Speak, MD 01/06/19 [phone]

## 2019-01-06 NOTE — Discharge Instructions (Addendum)
Please read attached information. If you experience any new or worsening signs or symptoms please return to the emergency room for evaluation. Please follow-up with your primary care provider or specialist as discussed.  °

## 2019-01-06 NOTE — ED Notes (Signed)
Called CT to let them know pt is ready for scan 

## 2019-01-06 NOTE — ED Notes (Signed)
Patient verbalizes understanding of discharge instructions. Opportunity for questioning and answers were provided. Armband removed by staff, pt discharged from ED.  

## 2019-01-06 NOTE — ED Triage Notes (Signed)
States has been  Running a fever since she left on the 4 th no n/v/d  Of sob

## 2019-01-07 ENCOUNTER — Ambulatory Visit (HOSPITAL_COMMUNITY): Payer: BC Managed Care – PPO

## 2019-04-08 ENCOUNTER — Telehealth: Payer: Self-pay | Admitting: Gastroenterology

## 2019-04-09 ENCOUNTER — Telehealth: Payer: Self-pay

## 2019-04-09 NOTE — Telephone Encounter (Signed)
-----  Message from Irving Copas., MD sent at 04/09/2019  7:47 AM EST ----- Yes please. Kriss Perleberg, Can you please move forward with finding an ERCP time slot for this patient for attempt at accessing the biliary anastomosis? Normal ERCP time slot is good. Please let us know when she is scheduled. Thanks. GM ----- Message ----- From: Milus Banister, MD Sent: 04/09/2019   7:33 AM EST To: Timothy Lasso, RN, Irving Copas., MD  Just following up on this. I don't see that she's been contacted about possible ERCP via GJ limb.  Chester Holstein, is it still OK to look at openings in the next several weeks for this?  Thanks  ----- Message ----- From: Milus Banister, MD Sent: 03/25/2019   2:32 PM EST To: Stark Klein, MD, Irving Copas., MD  Gabe,  Yes, I think what you are proposing sounds like a good plan.  Thanks  DJ ----- Message ----- From: Irving Copas., MD Sent: 03/25/2019   1:04 PM EST To: Milus Banister, MD, Stark Klein, MD  Dorris Fetch, I think it is certainly worthwhile to attempt. If the GJ limb to anastomosis is short we have a shot. I have done a few of these without the balloon-assisted ERCP scope and been successful at reaching this region if the limbs are not too long. DJ, would you like me to try and get her on the books for a few weeks from now? GM ----- Message ----- From: Stark Klein, MD Sent: 03/24/2019   4:57 PM EST To: Milus Banister, MD, #  Linna Hoff and Chester Holstein,  This is the lady that had Whipple 2.5 years ago for cystic pancreatic mass followed by prolonged pancreatic leak.  That resolved.  However, she continues to get bouts of itching and has elevated LFTs.  She saw [facility] and had liver biopsy.  All her stuff was reviewed with [facility] hepatology/transplant folks.  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  So can't get PTC as the intrahepatic bile ducts are not  seen in the liver.    Gabe, do you think you could do ERCP via GJ limb to the hepaticojejunostomy and maybe dilate vs stent to see if it helps?  She is on ursodiol but can't take the higher doses due to side effects.    Thanks for consideration. FB

## 2019-04-10 NOTE — Telephone Encounter (Signed)
Left message on machine to call back  

## 2019-04-11 NOTE — Telephone Encounter (Signed)
Left message on machine to call back  

## 2019-04-14 ENCOUNTER — Other Ambulatory Visit: Payer: Self-pay

## 2019-04-14 DIAGNOSIS — R7989 Other specified abnormal findings of blood chemistry: Secondary | ICD-10-CM

## 2019-04-14 DIAGNOSIS — K8689 Other specified diseases of pancreas: Secondary | ICD-10-CM

## 2019-04-14 DIAGNOSIS — K831 Obstruction of bile duct: Secondary | ICD-10-CM

## 2019-04-14 DIAGNOSIS — L299 Pruritus, unspecified: Secondary | ICD-10-CM

## 2019-04-14 NOTE — Telephone Encounter (Signed)
ERCP Dr Jerilynn Mages [facility] 05/05/19 at 1130 am COVID testing 12/31 ERCP scheduled, pt instructed and medications reviewed.  Patient instructions mailed to home.  Patient to call with any questions or concerns.

## 2019-04-26 ENCOUNTER — Other Ambulatory Visit: Payer: Self-pay | Admitting: Gastroenterology

## 2019-04-30 ENCOUNTER — Other Ambulatory Visit: Payer: Self-pay

## 2019-04-30 ENCOUNTER — Encounter (HOSPITAL_COMMUNITY): Payer: Self-pay | Admitting: Gastroenterology

## 2019-04-30 NOTE — Progress Notes (Signed)
Patient denies shortness of breath, fever, cough and chest pain.  PCP -  None Cardiologist - None Nephrology -Dr Alinda Money [facility]- Dr Jenell Milliner  Chest x-ray - 01/11/19 CE EKG - denies Stress Test - denies ECHO - denies Cardiac Cath - denies  OSA: yes but does not use CPAP. Lost weight per patient.  Anesthesia review: No   STOP now taking any Aspirin (unless otherwise instructed by your surgeon), Aleve, Naproxen, Ibuprofen, Motrin, Advil, Goody's, BC's, all herbal medications, fish oil, and all vitamins.   Coronavirus Screening Have you experienced the following symptoms:  Cough yes/no: No Fever (>100.67F)  yes/no: No Runny nose yes/no: No Sore throat yes/no: No Difficulty breathing/shortness of breath  yes/no: No  Have you traveled in the last 14 days and where? yes/no: No  Patient verbalized understanding of instructions that were given via phone.

## 2019-04-30 NOTE — Progress Notes (Signed)
Sent an IB message to MD requesting a current H&Pl for 05/05/19 endo procedure.  Received call from Dr Rush Landmark stating that he would complete the H&P on day of procedure.

## 2019-05-01 ENCOUNTER — Other Ambulatory Visit (HOSPITAL_COMMUNITY)
Admission: RE | Admit: 2019-05-01 | Discharge: 2019-05-01 | Disposition: A | Discharge status: Home / Self Care | Payer: BC Managed Care – PPO | Source: Ambulatory Visit | Attending: Gastroenterology | Admitting: Gastroenterology

## 2019-05-01 DIAGNOSIS — Z20828 Contact with and (suspected) exposure to other viral communicable diseases: Secondary | ICD-10-CM | POA: Insufficient documentation

## 2019-05-01 DIAGNOSIS — Z01812 Encounter for preprocedural laboratory examination: Secondary | ICD-10-CM | POA: Diagnosis not present

## 2019-05-02 LAB — NOVEL CORONAVIRUS, NAA (HOSP ORDER, SEND-OUT TO REF LAB; TAT 18-24 HRS): SARS-CoV-2, NAA: NOT DETECTED

## 2019-05-05 ENCOUNTER — Ambulatory Visit (HOSPITAL_COMMUNITY): Payer: BC Managed Care – PPO | Admitting: Registered Nurse

## 2019-05-05 ENCOUNTER — Ambulatory Visit (HOSPITAL_COMMUNITY): Payer: BC Managed Care – PPO

## 2019-05-05 ENCOUNTER — Ambulatory Visit (HOSPITAL_COMMUNITY)
Admission: RE | Admit: 2019-05-05 | Discharge: 2019-05-05 | Disposition: A | Discharge status: Home / Self Care | Payer: BC Managed Care – PPO | Attending: Gastroenterology | Admitting: Gastroenterology

## 2019-05-05 ENCOUNTER — Encounter (HOSPITAL_COMMUNITY): Payer: Self-pay | Admitting: Gastroenterology

## 2019-05-05 ENCOUNTER — Encounter (HOSPITAL_COMMUNITY)
Admission: RE | Disposition: A | Discharge status: Home / Self Care | Payer: Self-pay | Source: Home / Self Care | Attending: Gastroenterology

## 2019-05-05 ENCOUNTER — Other Ambulatory Visit: Payer: Self-pay

## 2019-05-05 DIAGNOSIS — Z09 Encounter for follow-up examination after completed treatment for conditions other than malignant neoplasm: Secondary | ICD-10-CM | POA: Diagnosis not present

## 2019-05-05 DIAGNOSIS — K839 Disease of biliary tract, unspecified: Secondary | ICD-10-CM | POA: Diagnosis not present

## 2019-05-05 DIAGNOSIS — Z980 Intestinal bypass and anastomosis status: Secondary | ICD-10-CM | POA: Diagnosis not present

## 2019-05-05 DIAGNOSIS — Z905 Acquired absence of kidney: Secondary | ICD-10-CM | POA: Diagnosis not present

## 2019-05-05 DIAGNOSIS — Z90411 Acquired partial absence of pancreas: Secondary | ICD-10-CM | POA: Insufficient documentation

## 2019-05-05 DIAGNOSIS — K838 Other specified diseases of biliary tract: Secondary | ICD-10-CM | POA: Diagnosis not present

## 2019-05-05 DIAGNOSIS — N189 Chronic kidney disease, unspecified: Secondary | ICD-10-CM | POA: Insufficient documentation

## 2019-05-05 DIAGNOSIS — K8689 Other specified diseases of pancreas: Secondary | ICD-10-CM

## 2019-05-05 DIAGNOSIS — K769 Liver disease, unspecified: Secondary | ICD-10-CM | POA: Diagnosis not present

## 2019-05-05 DIAGNOSIS — Z85528 Personal history of other malignant neoplasm of kidney: Secondary | ICD-10-CM | POA: Diagnosis not present

## 2019-05-05 DIAGNOSIS — L299 Pruritus, unspecified: Secondary | ICD-10-CM

## 2019-05-05 DIAGNOSIS — R7989 Other specified abnormal findings of blood chemistry: Secondary | ICD-10-CM

## 2019-05-05 DIAGNOSIS — G4730 Sleep apnea, unspecified: Secondary | ICD-10-CM | POA: Insufficient documentation

## 2019-05-05 DIAGNOSIS — K831 Obstruction of bile duct: Secondary | ICD-10-CM | POA: Diagnosis present

## 2019-05-05 HISTORY — PX: REMOVAL OF STONES: SHX5545

## 2019-05-05 HISTORY — PX: ENDOSCOPIC RETROGRADE CHOLANGIOPANCREATOGRAPHY (ERCP) WITH PROPOFOL: SHX5810

## 2019-05-05 HISTORY — PX: ESOPHAGOGASTRODUODENOSCOPY (EGD) WITH PROPOFOL: SHX5813

## 2019-05-05 HISTORY — PX: SUBMUCOSAL TATTOO INJECTION: SHX6856

## 2019-05-05 HISTORY — PX: BILIARY STENT PLACEMENT: SHX5538

## 2019-05-05 HISTORY — PX: BILIARY DILATION: SHX6850

## 2019-05-05 SURGERY — ENDOSCOPIC RETROGRADE CHOLANGIOPANCREATOGRAPHY (ERCP) WITH PROPOFOL
Anesthesia: General

## 2019-05-05 MED ORDER — SODIUM CHLORIDE 0.9 % IV SOLN
INTRAVENOUS | Status: DC | PRN
  Administered 2019-05-05: 16:00:00 110 mL

## 2019-05-05 MED ORDER — LACTATED RINGERS IV SOLN: INTRAVENOUS | Status: DC

## 2019-05-05 MED ORDER — FENTANYL CITRATE (PF) 250 MCG/5ML IJ SOLN
INTRAMUSCULAR | Status: DC | PRN
  Administered 2019-05-05 (×2): 50 ug via INTRAVENOUS
  Administered 2019-05-05: 100 ug via INTRAVENOUS

## 2019-05-05 MED ORDER — CIPROFLOXACIN HCL 500 MG PO TABS: 500.0000 mg | ORAL_TABLET | Freq: Two times a day (BID) | ORAL | 0 refills | Status: AC

## 2019-05-05 MED ORDER — SODIUM CHLORIDE 0.9 % IV SOLN: INTRAVENOUS | Status: DC

## 2019-05-05 MED ORDER — ONDANSETRON HCL 4 MG/2ML IJ SOLN
INTRAMUSCULAR | Status: DC | PRN
  Administered 2019-05-05: 4 mg via INTRAVENOUS

## 2019-05-05 MED ORDER — SPOT INK MARKER SYRINGE KIT
PACK | SUBMUCOSAL | Status: AC
  Filled 2019-05-05: qty 5

## 2019-05-05 MED ORDER — CIPROFLOXACIN IN D5W 400 MG/200ML IV SOLN
INTRAVENOUS | Status: DC | PRN
  Administered 2019-05-05: 400 mg via INTRAVENOUS

## 2019-05-05 MED ORDER — INDOMETHACIN 50 MG RE SUPP
RECTAL | Status: DC | PRN
  Administered 2019-05-05: 100 mg via RECTAL

## 2019-05-05 MED ORDER — GLUCAGON HCL RDNA (DIAGNOSTIC) 1 MG IJ SOLR
INTRAMUSCULAR | Status: DC | PRN
  Administered 2019-05-05 (×2): .25 mg via INTRAVENOUS

## 2019-05-05 MED ORDER — CIPROFLOXACIN IN D5W 400 MG/200ML IV SOLN
INTRAVENOUS | Status: AC
  Filled 2019-05-05: qty 200

## 2019-05-05 MED ORDER — SUCCINYLCHOLINE CHLORIDE 200 MG/10ML IV SOSY
PREFILLED_SYRINGE | INTRAVENOUS | Status: DC | PRN
  Administered 2019-05-05: 120 mg via INTRAVENOUS

## 2019-05-05 MED ORDER — SPOT INK MARKER SYRINGE KIT
PACK | SUBMUCOSAL | Status: DC | PRN
  Administered 2019-05-05: 3 mL via SUBMUCOSAL

## 2019-05-05 MED ORDER — FENTANYL CITRATE (PF) 100 MCG/2ML IJ SOLN: 25.0000 ug | INTRAMUSCULAR | Status: DC | PRN

## 2019-05-05 MED ORDER — INDOMETHACIN 50 MG RE SUPP
RECTAL | Status: AC
  Filled 2019-05-05: qty 2

## 2019-05-05 MED ORDER — LIDOCAINE 2% (20 MG/ML) 5 ML SYRINGE
INTRAMUSCULAR | Status: DC | PRN
  Administered 2019-05-05: 100 mg via INTRAVENOUS

## 2019-05-05 MED ORDER — ONDANSETRON HCL 4 MG/2ML IJ SOLN: 4.0000 mg | Freq: Once | INTRAMUSCULAR | Status: DC | PRN

## 2019-05-05 MED ORDER — GLUCAGON HCL RDNA (DIAGNOSTIC) 1 MG IJ SOLR
INTRAMUSCULAR | Status: AC
  Filled 2019-05-05: qty 1

## 2019-05-05 MED ORDER — PROPOFOL 10 MG/ML IV BOLUS
INTRAVENOUS | Status: DC | PRN
  Administered 2019-05-05: 150 mg via INTRAVENOUS

## 2019-05-05 NOTE — Transfer of Care (Signed)
Immediate Anesthesia Transfer of Care Note  Patient: Krystal Reynolds  Procedure(s) Performed: ENDOSCOPIC RETROGRADE CHOLANGIOPANCREATOGRAPHY (ERCP) WITH PROPOFOL (N/A )  Patient Location: PACU  Anesthesia Type:General  Level of Consciousness: awake, alert  and oriented  Airway & Oxygen Therapy: Patient Spontanous Breathing and Patient connected to nasal cannula oxygen  Post-op Assessment: Report given to RN and Post -op Vital signs reviewed and stable  Post vital signs: Reviewed and stable  Last Vitals:  Vitals Value Taken Time  BP 163/78 05/05/19 1605  Temp    Pulse 70 05/05/19 1605  Resp 15 05/05/19 1605  SpO2 92 % 05/05/19 1605    Last Pain:  Vitals:   05/05/19 1113  TempSrc: Oral  PainSc: 0-No pain      Patients Stated Pain Goal: 4 ([id number])  Complications: No apparent anesthesia complications

## 2019-05-05 NOTE — Op Note (Signed)
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 Estimated Blood Loss:     Estimated blood loss was minimal. Procedure:                Pre-Anesthesia Assessment:                           - Prior to the procedure, a History and Physical                            was performed, and patient medications and                            allergies were reviewed. The patient's tolerance of                            previous anesthesia was also reviewed. The risks                            and benefits of the procedure and the sedation                            options and risks were discussed with the patient.                            All questions were answered, and informed consent                            was obtained. Prior Anticoagulants: The patient has  taken no previous anticoagulant or antiplatelet                            agents. ASA Grade Assessment: III - A patient with                            severe systemic disease. After reviewing the risks                            and benefits, the patient was deemed in                            satisfactory condition to undergo the  procedure.                           After obtaining informed consent, the scope was                            passed under direct vision. Throughout the                            procedure, the patient's blood pressure, pulse, and                            oxygen saturations were monitored continuously. The                            PCF-H190DL ([id number]) Olympus pediatric colonoscope                            was introduced through the mouth, and used to                            inject contrast into without successful                            cannulation. The TJF-Q180V ([id number]) Olympus                            duodenoscope was introduced through the mouth, and                            used to inject contrast into and used to inject                            contrast into the bile duct. The ERCP was                            technically difficult and complex due to                            challenging cannulation because of abnormal  anatomy. Successful completion of the procedure was                            aided by performing the maneuvers documented                            (below) in this report. The patient tolerated the                            procedure. Scope In: Scope Out: Findings:      A presumed biliary stent was visible on the scout film (operative note       suggested previous biliary stent had been placed at time of surgery). A       pediatric colonoscope was used for the examination of the upper       gastrointestinal tract. The scope was passed under direct vision through       the upper GI tract. No gross lesions were noted in the entire esophagus.       Evidence of a gastrojejunostomy was found in the gastric body. This was       characterized by healthy appearing mucosa. The examined efferent jejunum       was normal. The examined afferent jejunum was normal. The bile duct       could not be found using the pediatric  colonoscope, suggestive of an       angulated anastomosis. Since the scope was relatively stable in a short       position <100 cm, I decided to attempt use of the ERCP scope. I       transitioned to this and followed the afferent limb (presumed due to the       stent that I had seen in the RUQ region).      After significant turning of the sphincterotome, a short 0.035 inch Soft       Jagwire was passed into the biliary tree along the previously noted       stent. The Autotome sphincterotome was passed over the guidewire and the       bile duct was then deeply cannulated. Contrast was injected. I       personally interpreted the bile duct images. Ductal flow of contrast was       adequate. Image quality was adequate. Contrast extended to the hepatic       ducts. Opacification of the common hepatic duct, left main hepatic duct       and right main hepatic duct was successful. The left main hepatic duct       contained the previously noted stent. The left main hepatic duct was       mildly dilated at 7 mm. No clear anastomosis stricture noted, but       decision made to pursue a dilation of the anastomosis. It was       successfully dilated with a Hurricane 4 mm balloon and a Hurricane 6 mm       balloon dilator. To discover objects, the biliary tree was swept with a       retrieval balloon starting at the right main hepatic duct. Sludge was       swept from the duct. Debris was swept from the duct. An occlusion       cholangiogram was performed that showed no  further significant biliary       pathology other than what was noted above. I could not remove the       previous biliary stent and I tried also to place a 2nd wire, but the       angulation did not allow for adequate placement. One 8.5 Fr by 5 cm       plastic biliary stent with a single external flap and a single internal       flap was placed into the right hepatic duct. Bile flowed through the       stent. The stent was in good  position.      A pancreatogram was not performed (could not visualize or find the       pancreaticojejunostomy).      The afferent limb was successfully injected with Spot (carbon black)       through the pediatric colonoscope for tattooing.      The endoscope was withdrawn from the patient. Impression:               - No gross lesions in esophagus.                           - A gastrojejunostomy was found, characterized by                            healthy appearing mucosa.                           - Normal examined afferent jejunum. This was                            tattooed for marking purposes.                           - Normal examined efferent jejunum.                           - Hepaticojejunostomy was found. The anastomosis                            was dilated. The biliary tree was swept with                            findings of sludge and debris.                           - The left main hepatic duct was mildly dilated.                           - Biliary stent was placed through the                            hepaticojejunostomy to see if it would be effective.                           - Unable to remove the previously placed biliary  stent.                           - Hopeful that things will improve with today's                            procedure and eventually hope to remove the biliary                            stent if able. Recommendation:           - The patient will be observed post-procedure,                            until all discharge criteria are met.                           - Discharge patient to home.                           - Patient has a contact number available for                            emergencies. The signs and symptoms of potential                            delayed complications were discussed with the                            patient. Return to normal activities tomorrow.                             Written discharge instructions were provided to the                            patient.                           - Check liver enzymes (AST, ALT, alkaline                            phosphatase, bilirubin) in 2 weeks.                           - Observe patient's clinical course.                           - Watch for pancreatitis, bleeding, perforation,                            and cholangitis.                           - Cipro (ciprofloxacin) 500 mg PO BID for 5 days.                           - Watch  for pancreatitis, bleeding, perforation,                            and cholangitis.                           - Repeat ERCP in 2-4 months for stent                            exchange/removal.                           - The findings and recommendations were discussed                            with the patient.                           - The findings and recommendations were discussed                            with the patient's family. Procedure Code(s):        --- Professional ---                           [id number], Endoscopic retrograde                            cholangiopancreatography (ERCP); with placement of                            endoscopic stent into biliary or pancreatic duct,                            including pre- and post-dilation and guide wire                            passage, when performed, including sphincterotomy,                            when performed, each stent                           43277, Endoscopic retrograde                            cholangiopancreatography (ERCP); with                            trans-endoscopic balloon dilation of                            biliary/pancreatic duct(s) or of ampulla                            (sphincteroplasty), including sphincterotomy, when  performed, each duct                           [phone], Endoscopic retrograde                            cholangiopancreatography (ERCP); with  removal of                            calculi/debris from biliary/pancreatic duct(s)                           43236, Esophagogastroduodenoscopy, flexible,                            transoral; with directed submucosal injection(s),                            any substance Diagnosis Code(s):        --- Professional ---                           Z98.0, Intestinal bypass and anastomosis status                           K83.1, Obstruction of bile duct                           R94.5, Abnormal results of liver function studies                           Z09, Encounter for follow-up examination after                            completed treatment for conditions other than                            malignant neoplasm                           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 All rights reserved. The codes documented in this report are preliminary and upon coder review may  be revised to meet current compliance requirements. Justice Britain, MD 05/05/2019 4:29:05 PM Number of Addenda: 0

## 2019-05-05 NOTE — Anesthesia Procedure Notes (Signed)
Procedure Name: Intubation Date/Time: 05/05/2019 1:23 PM Performed by: Trinna Post., CRNA Pre-anesthesia Checklist: Patient identified, Emergency Drugs available, Suction available, Patient being monitored and Timeout performed Patient Re-evaluated:Patient Re-evaluated prior to induction Oxygen Delivery Method: Circle system utilized Preoxygenation: Pre-oxygenation with 100% oxygen Induction Type: IV induction and Rapid sequence Laryngoscope Size: Mac and 3 Grade View: Grade II Tube type: Oral Tube size: 7.0 mm Number of attempts: 1 Airway Equipment and Method: Stylet Placement Confirmation: ETT inserted through vocal cords under direct vision,  positive ETCO2 and breath sounds checked- equal and bilateral Secured at: 23 cm Tube secured with: Tape Dental Injury: Teeth and Oropharynx as per pre-operative assessment

## 2019-05-05 NOTE — Discharge Instructions (Signed)
Ciprofloxacin 500 mg twice daily for 5-days as antibiotic prophylaxis will be sent to the pharmacy.  YOU HAD AN ENDOSCOPIC PROCEDURE TODAY: Refer to the procedure report and other information in the discharge instructions given to you for any specific questions about what was found during the examination. If this information does not answer your questions, please call [place] office at [phone] to clarify.   YOU SHOULD EXPECT: Some feelings of bloating in the abdomen. Passage of more gas than usual. Walking can help get rid of the air that was put into your GI tract during the procedure and reduce the bloating. If you had a lower endoscopy (such as a colonoscopy or flexible sigmoidoscopy) you may notice spotting of blood in your stool or on the toilet paper. Some abdominal soreness may be present for a day or two, also.  DIET: Your first meal following the procedure should be a light meal and then it is ok to progress to your normal diet. A half-sandwich or bowl of soup is an example of a good first meal. Heavy or fried foods are harder to digest and may make you feel nauseous or bloated. Drink plenty of fluids but you should avoid alcoholic beverages for 24 hours. If you had a esophageal dilation, please see attached instructions for diet.    ACTIVITY: Your care partner should take you home directly after the procedure. You should plan to take it easy, moving slowly for the rest of the day. You can resume normal activity the day after the procedure however YOU SHOULD NOT DRIVE, use power tools, machinery or perform tasks that involve climbing or major physical exertion for 24 hours (because of the sedation medicines used during the test).   SYMPTOMS TO REPORT IMMEDIATELY: A gastroenterologist can be reached at any hour. Please call [phone]  for any of the following symptoms:   Following upper endoscopy (EGD, EUS, ERCP, esophageal dilation) Vomiting of blood or coffee ground material  New,  significant abdominal pain  New, significant chest pain or pain under the shoulder blades  Painful or persistently difficult swallowing  New shortness of breath  Black, tarry-looking or red, bloody stools  FOLLOW UP:  If any biopsies were taken you will be contacted by phone or by letter within the next 1-3 weeks. Call [phone]  if you have not heard about the biopsies in 3 weeks.  Please also call with any specific questions about appointments or follow up tests.

## 2019-05-05 NOTE — Anesthesia Preprocedure Evaluation (Signed)
Anesthesia Evaluation  Patient identified by MRN, date of birth, ID band Patient awake    Reviewed: Allergy & Precautions, NPO status , Patient's Chart, lab work & pertinent test results  Airway Mallampati: II  TM Distance: >3 FB Neck ROM: Full    Dental  (+) Teeth Intact, Dental Advisory Given   Pulmonary sleep apnea ,    Pulmonary exam normal breath sounds clear to auscultation       Cardiovascular hypertension, (-) angina(-) CAD and (-) Past MI Normal cardiovascular exam Rhythm:Regular Rate:Normal     Neuro/Psych PSYCHIATRIC DISORDERS Depression negative neurological ROS     GI/Hepatic Neg liver ROS, S/p Whipple   Endo/Other  Obesity   Renal/GU Renal InsufficiencyRenal disease     Musculoskeletal negative musculoskeletal ROS (+)   Abdominal   Peds  Hematology negative hematology ROS (+)   Anesthesia Other Findings Day of surgery medications reviewed with the patient.  Reproductive/Obstetrics                             Anesthesia Physical Anesthesia Plan  ASA: II  Anesthesia Plan: General   Post-op Pain Management:    Induction: Intravenous  PONV Risk Score and Plan: 3 and Midazolam, Ondansetron and Dexamethasone  Airway Management Planned: Oral ETT  Additional Equipment:   Intra-op Plan:   Post-operative Plan: Extubation in OR  Informed Consent: I have reviewed the patients History and Physical, chart, labs and discussed the procedure including the risks, benefits and alternatives for the proposed anesthesia with the patient or authorized representative who has indicated his/her understanding and acceptance.     Dental advisory given  Plan Discussed with: CRNA  Anesthesia Plan Comments:         Anesthesia Quick Evaluation

## 2019-05-05 NOTE — H&P (Signed)
GASTROENTEROLOGY PROCEDURE H&P NOTE   Primary Care Physician: Jasmine Awe., MD  HPI: Krystal Reynolds is a 49 y.o. female who presents for Enteroscopy + ERCP attempt.  Past Medical History:  Diagnosis Date  . Anemia 01-02-12   mostly low iron- tx. oral meds, not in several yrs  . Chronic kidney disease 01-02-12   dx. rt. kidney neoplasm-surgery planned, past hx. protein in urine.  . 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 Depression   . Family history of anesthesia complication    mother slow to wake up   . Leg swelling 01-02-12   occ. episodes of pitting edema lower extremities at end of day.  . Preeclampsia 1998   Resolved  . Pregnancy induced hypertension 1998   Resolved  . Sleep apnea    "dx'd; used mask for ~ 1 yr; lost weight; no longer wear mask" (12/21/2016)  . Thyroid enlargement 01/02/2012   left thyroid enlargement, being watched, S/P [place]; no tx.   Past Surgical History:  Procedure Laterality Date  . BREATH TEK H PYLORI  09/20/2011   Procedure: BREATH TEK H PYLORI;  Surgeon: Shann Medal, MD;  Location: [facility];  Service: General;  Laterality: N/A;  . childbirth  01-02-12   x2 NVD- pre-exclampsia with pregrancy  . CHOLECYSTECTOMY  11/23/2016   'w/whipple"  . EUS  12/07/2011   Procedure: UPPER ENDOSCOPIC ULTRASOUND (EUS) LINEAR;  Surgeon: Milus Banister, MD;  Location: [facility];  Service: Endoscopy;  Laterality: N/A;  radial linear  . IR RADIOLOGIST EVAL & MGMT  01/11/2017  . LIVER BIOPSY  12/2018  . ROBOTIC ASSITED PARTIAL NEPHRECTOMY Left 10/02/2013   Procedure: ROBOTIC ASSITED PARTIAL NEPHRECTOMY;  Surgeon: Raynelle Bring, MD;  Location: [facility];  Service: Urology;  Laterality: Left;  . ROBOTIC ASSITED PARTIAL NEPHRECTOMY Right 12/2011  . VAGINAL HYSTERECTOMY  2008  . WHIPPLE PROCEDURE N/A 11/23/2016   Procedure: WHIPPLE PROCEDURE;  Surgeon: Stark Klein, MD;  Location:  [facility];  Service: General;  Laterality: N/A;   No current facility-administered medications for this encounter.   No Known Allergies Family History  Problem Relation Age of Onset  . Cancer Paternal Uncle        lung  . AAA (abdominal aortic aneurysm) Maternal Grandfather        60s-70s   Social History   Socioeconomic History  . Marital status: Married    Spouse name: Not on file  . Number of children: 2  . Years of education: Not on file  . Highest education level: Not on file  Occupational History  . Occupation: Product manager: [employer]  Tobacco Use  . Smoking status: Never Smoker  . Smokeless tobacco: Never Used  Substance and Sexual Activity  . Alcohol use: Yes    Comment:  "beer twice/month; if that"  . Drug use: No  . Sexual activity: Not Currently    Birth control/protection: Surgical    Comment: Hysterectomy  Other Topics Concern  . Not on file  Social History Narrative  . Not on file   Social Determinants of Health   Financial Resource Strain:   . Difficulty of Paying Living Expenses: Not on file  Food Insecurity:   . Worried About Charity fundraiser in the Last Year: Not on file  . Ran Out of Food in the Last Year: Not on file  Transportation Needs:   . Film/video editor (Medical): Not on file  . Lack of Transportation (Non-Medical): Not on file  Physical Activity:   . Days of Exercise per Week: Not on file  . Minutes of Exercise per Session: Not on file  Stress:   . Feeling of Stress : Not on file  Social Connections:   . Frequency of Communication with Friends and Family: Not on file  . Frequency of Social Gatherings with Friends and Family: Not on file  . Attends Religious Services: Not on file  . Active Member of Clubs or Organizations: Not on file  . Attends Archivist Meetings: Not on file  . Marital Status: Not on file  Intimate Partner Violence:   . Fear of Current or Ex-Partner: Not on file  .  Emotionally Abused: Not on file  . Physically Abused: Not on file  . Sexually Abused: Not on file    Physical Exam: Vital signs in last 24 hours:     GEN: NAD EYE: Sclerae anicteric ENT: MMM CV: Non-tachycardic GI: Soft, NT/ND NEURO:  Alert & Oriented x 3  Lab Results: No results for input(s): WBC, HGB, HCT, PLT in the last 72 hours. BMET No results for input(s): NA, K, CL, CO2, GLUCOSE, BUN, CREATININE, CALCIUM in the last 72 hours. LFT No results for input(s): PROT, ALBUMIN, AST, ALT, ALKPHOS, BILITOT, BILIDIR, IBILI in the last 72 hours. PT/INR No results for input(s): LABPROT, INR in the last 72 hours.   Impression / Plan: This is a 49 y.o.female who presents for Enteroscopy + ERCP attempt.  The risks of an ERCP were discussed at length, including but not limited to the risk of perforation, bleeding, abdominal pain, post-ERCP pancreatitis (while usually mild can be severe and even life threatening).  The risks and benefits of endoscopic evaluation were discussed with the patient; these include but are not limited to the risk of perforation, infection, bleeding, missed lesions, lack of diagnosis, severe illness requiring hospitalization, as well as anesthesia and sedation related illnesses.  The patient is agreeable to proceed.    Justice Britain, MD [facility] # [id number]

## 2019-05-06 ENCOUNTER — Telehealth: Payer: Self-pay

## 2019-05-06 DIAGNOSIS — R7989 Other specified abnormal findings of blood chemistry: Secondary | ICD-10-CM

## 2019-05-06 NOTE — Telephone Encounter (Signed)
-----   Message from Irving Copas., MD sent at 05/06/2019  5:07 AM EST ----- Regarding: Followup Krystal Reynolds, This patient needs a HFP in 2 weeks under Dan's name or mine. Thanks. GM

## 2019-05-06 NOTE — Anesthesia Postprocedure Evaluation (Signed)
Anesthesia Post Note  Patient: Krystal Reynolds  Procedure(s) Performed: ENDOSCOPIC RETROGRADE CHOLANGIOPANCREATOGRAPHY (ERCP) WITH PROPOFOL (N/A ) ESOPHAGOGASTRODUODENOSCOPY (EGD) WITH PROPOFOL (N/A ) BILIARY DILATION BILIARY STENT PLACEMENT SUBMUCOSAL TATTOO INJECTION REMOVAL OF STONES     Patient location during evaluation: PACU Anesthesia Type: General Level of consciousness: awake and alert Pain management: pain level controlled Vital Signs Assessment: post-procedure vital signs reviewed and stable Respiratory status: spontaneous breathing, nonlabored ventilation and respiratory function stable Cardiovascular status: blood pressure returned to baseline and stable Postop Assessment: no apparent nausea or vomiting Anesthetic complications: no    Last Vitals:  Vitals:   05/05/19 1635 05/05/19 1650  BP: (!) 175/96 (!) 152/82  Pulse: (!) 59 63  Resp: 19 17  Temp:  (!) 36.3 C  SpO2: 93% 94%    Last Pain:  Vitals:   05/05/19 1635  TempSrc:   PainSc: 0-No pain                 Lynda Rainwater

## 2019-05-18 ENCOUNTER — Other Ambulatory Visit: Payer: Self-pay | Admitting: Gastroenterology

## 2019-05-30 ENCOUNTER — Telehealth: Payer: Self-pay | Admitting: Gastroenterology

## 2019-05-30 NOTE — Telephone Encounter (Signed)
Called to check in with patient as she had come in for her repeat set of hepatic function panel labs post her recent ERCP earlier this month.  Left message as it went to her voicemail.  We will have advanced RN Gerarda Fraction reach out and see if she can come in for labs in the next couple of weeks.  Based on labs, hopefully they are improved from our recent ERCP and stent placement we will see.  Follow-up to be based after the labs have returned.  I will update Dr. Barry Dienes and Dr. Ardis Hughs as well.  Justice Britain, MD [facility] # [phone]

## 2019-06-02 NOTE — Telephone Encounter (Signed)
Left message on machine to call back  

## 2019-06-02 NOTE — Telephone Encounter (Signed)
Thanks for the update. GM

## 2019-06-02 NOTE — Telephone Encounter (Signed)
The pt has been advised and she will be in tomorrow to Wednesday for labs.  She does report that she has NO itching and is doing well.

## 2019-06-04 ENCOUNTER — Other Ambulatory Visit (INDEPENDENT_AMBULATORY_CARE_PROVIDER_SITE_OTHER): Payer: BC Managed Care – PPO

## 2019-06-04 DIAGNOSIS — R7989 Other specified abnormal findings of blood chemistry: Secondary | ICD-10-CM | POA: Diagnosis not present

## 2019-06-04 LAB — HEPATIC FUNCTION PANEL
ALT: 56 U/L — ABNORMAL HIGH (ref 0–35)
AST: 54 U/L — ABNORMAL HIGH (ref 0–37)
Albumin: 4.2 g/dL (ref 3.5–5.2)
Alkaline Phosphatase: 427 U/L — ABNORMAL HIGH (ref 39–117)
Bilirubin, Direct: 0.1 mg/dL (ref 0.0–0.3)
Total Bilirubin: 0.6 mg/dL (ref 0.2–1.2)
Total Protein: 8.2 g/dL (ref 6.0–8.3)

## 2019-06-16 ENCOUNTER — Telehealth: Payer: Self-pay | Admitting: Gastroenterology

## 2019-06-16 ENCOUNTER — Other Ambulatory Visit: Payer: Self-pay | Admitting: Gastroenterology

## 2019-06-16 DIAGNOSIS — L299 Pruritus, unspecified: Secondary | ICD-10-CM

## 2019-06-16 NOTE — Telephone Encounter (Signed)
The pt states that the itching stopped after her ERCP on 05/05/19 but about a week ago the itching returned. Would you like to get labs?

## 2019-06-17 ENCOUNTER — Other Ambulatory Visit (INDEPENDENT_AMBULATORY_CARE_PROVIDER_SITE_OTHER): Payer: BC Managed Care – PPO

## 2019-06-17 DIAGNOSIS — L299 Pruritus, unspecified: Secondary | ICD-10-CM | POA: Diagnosis not present

## 2019-06-17 LAB — HEPATIC FUNCTION PANEL
ALT: 50 U/L — ABNORMAL HIGH (ref 0–35)
AST: 42 U/L — ABNORMAL HIGH (ref 0–37)
Albumin: 3.9 g/dL (ref 3.5–5.2)
Alkaline Phosphatase: 359 U/L — ABNORMAL HIGH (ref 39–117)
Bilirubin, Direct: 0.1 mg/dL (ref 0.0–0.3)
Total Bilirubin: 0.5 mg/dL (ref 0.2–1.2)
Total Protein: 7.5 g/dL (ref 6.0–8.3)

## 2019-06-17 NOTE — Telephone Encounter (Signed)
Lab order in Epic and pt aware

## 2019-06-17 NOTE — Telephone Encounter (Signed)
Yes, please get LFTs.  Make sure Krystal Reynolds is cc'd on those results.  Thanks  Krystal Reynolds

## 2019-06-18 ENCOUNTER — Other Ambulatory Visit: Payer: Self-pay

## 2019-06-18 DIAGNOSIS — Z9689 Presence of other specified functional implants: Secondary | ICD-10-CM

## 2019-06-25 ENCOUNTER — Other Ambulatory Visit: Payer: Self-pay

## 2019-06-25 ENCOUNTER — Ambulatory Visit (INDEPENDENT_AMBULATORY_CARE_PROVIDER_SITE_OTHER)
Admission: RE | Admit: 2019-06-25 | Discharge: 2019-06-25 | Disposition: A | Discharge status: Home / Self Care | Payer: BC Managed Care – PPO | Source: Ambulatory Visit | Attending: Gastroenterology | Admitting: Gastroenterology

## 2019-06-25 DIAGNOSIS — Z9689 Presence of other specified functional implants: Secondary | ICD-10-CM | POA: Diagnosis not present

## 2019-06-30 ENCOUNTER — Telehealth: Payer: Self-pay

## 2019-06-30 DIAGNOSIS — R7989 Other specified abnormal findings of blood chemistry: Secondary | ICD-10-CM

## 2019-06-30 NOTE — Telephone Encounter (Signed)
The patient has been notified of this information and all questions answered. Lab order in Epic

## 2019-06-30 NOTE — Telephone Encounter (Signed)
Left message on machine to call back  

## 2019-06-30 NOTE — Telephone Encounter (Signed)
-----   Message from Irving Copas., MD sent at 06/28/2019  5:15 AM EST ----- Regarding: Follow-up The stent looks to still be in good position.Krystal Reynolds I would let her know that.Can you ask her if she still is having significant itching?Would be interesting to see how the liver tests look in another couple of weeks and I would order a hepatic function panel for that time. DJ, I can certainly entertain going back in sooner to try and remove or replace the stent but the liver tests are still downtrending I may want to hold that for now.That is my thought unless you think otherwise or if repeat liver tests start going back up. Thanks.GM ----- Message ----- From: Interface, Rad Results In Sent: 06/26/2019   8:50 AM EST To: Irving Copas., MD

## 2019-07-01 NOTE — Telephone Encounter (Signed)
Done on 05/05/2019

## 2019-08-02 ENCOUNTER — Encounter: Payer: Self-pay | Admitting: Gastroenterology

## 2019-08-02 NOTE — Progress Notes (Signed)
March 31 labs from [facility]  AST/ALT 47/70 Alkaline phosphatase 471 Total bili 0.6 Direct bili 0.2 Albumin 4.0 Globulin 3.2 Total protein 7.2  I will have these records scanned into the chart. She will be due for a ERCP attempt in April or May. I will forward to Dr. Ardis Hughs and Dr. Barry Dienes.  Patty, please reach out to patient and work on getting her scheduled for the next 1 to 2 months for ERCP and stent exchange and attempt at removal of previous stent.   Justice Britain, MD [facility] # [id number]

## 2019-08-04 NOTE — Progress Notes (Signed)
Staff message to call pt and set up for ERCP

## 2019-08-18 ENCOUNTER — Telehealth: Payer: Self-pay | Admitting: Gastroenterology

## 2019-08-18 NOTE — Telephone Encounter (Signed)
Pt called requesting rf for ursodiol. She states that her pharmacy will not rf it until 4/25 but she is out of med. Pt reported to take medication 1 BID so pharmacy needs prescription for that dosage. Pt also stated that one of her MDs told her that she needs a f/u appt after recent tests done with [place] and that we were going to contact her but she has not heard from [place] in longer than a week. Pls call her.

## 2019-08-19 MED ORDER — URSODIOL 300 MG PO CAPS: 300.0000 mg | ORAL_CAPSULE | Freq: Two times a day (BID) | ORAL | 11 refills | Status: DC

## 2019-08-19 NOTE — Telephone Encounter (Signed)
She was having some intolerance when on 900 daily and so she backed down to 300 daily.  If she is tolerating 300 twice daily now then that is great, please prescribe 300mg  pills, one pill bid, disp 1 month with 11 refills.  Thanks

## 2019-08-19 NOTE — Telephone Encounter (Signed)
Left message on voicemail to notify patient that refills have been sent to pharmacy as advised by Dr Ardis Hughs.  Advised patient to return call to office with any further questions or concerns.

## 2019-08-28 ENCOUNTER — Telehealth: Payer: Self-pay | Admitting: Gastroenterology

## 2019-08-28 NOTE — Telephone Encounter (Signed)
Dr Ardis Hughs please advise on timing of ERCP?

## 2019-08-29 NOTE — Telephone Encounter (Signed)
Patty, Please Move forward with scheduling ERCP next available. Thanks. GM

## 2019-08-29 NOTE — Telephone Encounter (Signed)
Chester Holstein has been spearheading this.  I will forward this to him.

## 2019-09-01 ENCOUNTER — Other Ambulatory Visit: Payer: Self-pay

## 2019-09-01 DIAGNOSIS — R7989 Other specified abnormal findings of blood chemistry: Secondary | ICD-10-CM

## 2019-09-01 DIAGNOSIS — Z9689 Presence of other specified functional implants: Secondary | ICD-10-CM

## 2019-09-01 DIAGNOSIS — K8689 Other specified diseases of pancreas: Secondary | ICD-10-CM

## 2019-09-01 NOTE — Telephone Encounter (Signed)
The pt has been scheduled for ERCP on 10/20/19 at 9 am at [facility] with Dr Rush Landmark COVID test on 6/17

## 2019-09-01 NOTE — Telephone Encounter (Signed)
The pt has been scheduled for ERCP on 10/20/19 at 9 am [facility] with Dr Rush Landmark  ERCP scheduled, pt instructed and medications reviewed.  Patient instructions mailed to home.  Patient to call with any questions or concerns.

## 2019-10-06 ENCOUNTER — Telehealth: Payer: Self-pay

## 2019-10-06 NOTE — Telephone Encounter (Signed)
The pt has been scheduled and instructed for ERCP

## 2019-10-06 NOTE — Telephone Encounter (Signed)
-----   Message from Timothy Lasso, RN sent at 08/04/2019  8:16 AM EDT ----- Chong Sicilian, please reach out to patient and work on getting her scheduled for the next 1 to 2 months for ERCP and stent exchange and attempt at removal of previous stent.

## 2019-10-16 ENCOUNTER — Other Ambulatory Visit (HOSPITAL_COMMUNITY)
Admission: RE | Admit: 2019-10-16 | Discharge: 2019-10-16 | Disposition: A | Discharge status: Home / Self Care | Payer: BC Managed Care – PPO | Source: Ambulatory Visit | Attending: Gastroenterology | Admitting: Gastroenterology

## 2019-10-16 DIAGNOSIS — Z20822 Contact with and (suspected) exposure to covid-19: Secondary | ICD-10-CM | POA: Insufficient documentation

## 2019-10-16 DIAGNOSIS — Z01812 Encounter for preprocedural laboratory examination: Secondary | ICD-10-CM | POA: Diagnosis not present

## 2019-10-16 LAB — SARS CORONAVIRUS 2 (TAT 6-24 HRS): SARS Coronavirus 2: NEGATIVE

## 2019-10-17 NOTE — Progress Notes (Signed)
I was unable to reach patient by phone.  I left  A message on voice mail.  I instructed the patient to arrive at [facility] entrance at --0715  , register in the [place]. DO NOT eat or drink anything after midnight.  I instructed the patient to take the following medications in the am with just enough water to get them down: Celexa.I asked patient to shower, do not wear any lotions, powders, cologne, jewelry, piercing, make-up or nail polish.  Wear clean clothes. Brush teeth. I informed patient that there will need to be a driver and someone to stay with him/her for the first 24 hours after surgery.   I instructed  patient to call [phone] in the am if there were any questions or problems.

## 2019-10-17 NOTE — Progress Notes (Signed)
Attempted pre op call for endoscopy procedure on Monday 10/20/19, no answer, automated VM, no message left.

## 2019-10-20 ENCOUNTER — Other Ambulatory Visit: Payer: Self-pay

## 2019-10-20 ENCOUNTER — Ambulatory Visit (HOSPITAL_COMMUNITY)
Admission: RE | Admit: 2019-10-20 | Discharge: 2019-10-20 | Disposition: A | Discharge status: Home / Self Care | Payer: BC Managed Care – PPO | Attending: Gastroenterology | Admitting: Gastroenterology

## 2019-10-20 ENCOUNTER — Encounter (HOSPITAL_COMMUNITY)
Admission: RE | Disposition: A | Discharge status: Home / Self Care | Payer: Self-pay | Source: Home / Self Care | Attending: Gastroenterology

## 2019-10-20 ENCOUNTER — Ambulatory Visit (HOSPITAL_COMMUNITY): Payer: BC Managed Care – PPO

## 2019-10-20 ENCOUNTER — Ambulatory Visit (HOSPITAL_COMMUNITY): Payer: BC Managed Care – PPO | Admitting: Anesthesiology

## 2019-10-20 ENCOUNTER — Encounter (HOSPITAL_COMMUNITY): Payer: Self-pay | Admitting: Gastroenterology

## 2019-10-20 DIAGNOSIS — Z85528 Personal history of other malignant neoplasm of kidney: Secondary | ICD-10-CM | POA: Insufficient documentation

## 2019-10-20 DIAGNOSIS — I10 Essential (primary) hypertension: Secondary | ICD-10-CM | POA: Diagnosis not present

## 2019-10-20 DIAGNOSIS — R748 Abnormal levels of other serum enzymes: Secondary | ICD-10-CM | POA: Diagnosis not present

## 2019-10-20 DIAGNOSIS — D649 Anemia, unspecified: Secondary | ICD-10-CM | POA: Diagnosis not present

## 2019-10-20 DIAGNOSIS — Z905 Acquired absence of kidney: Secondary | ICD-10-CM | POA: Diagnosis not present

## 2019-10-20 DIAGNOSIS — T85898A Other specified complication of other internal prosthetic devices, implants and grafts, initial encounter: Secondary | ICD-10-CM | POA: Diagnosis not present

## 2019-10-20 DIAGNOSIS — G4730 Sleep apnea, unspecified: Secondary | ICD-10-CM | POA: Diagnosis not present

## 2019-10-20 DIAGNOSIS — Z9689 Presence of other specified functional implants: Secondary | ICD-10-CM

## 2019-10-20 DIAGNOSIS — Z9889 Other specified postprocedural states: Secondary | ICD-10-CM | POA: Diagnosis not present

## 2019-10-20 DIAGNOSIS — K838 Other specified diseases of biliary tract: Secondary | ICD-10-CM | POA: Insufficient documentation

## 2019-10-20 DIAGNOSIS — Y832 Surgical operation with anastomosis, bypass or graft as the cause of abnormal reaction of the patient, or of later complication, without mention of misadventure at the time of the procedure: Secondary | ICD-10-CM | POA: Diagnosis not present

## 2019-10-20 DIAGNOSIS — R7989 Other specified abnormal findings of blood chemistry: Secondary | ICD-10-CM

## 2019-10-20 DIAGNOSIS — K831 Obstruction of bile duct: Secondary | ICD-10-CM | POA: Diagnosis not present

## 2019-10-20 DIAGNOSIS — Z4659 Encounter for fitting and adjustment of other gastrointestinal appliance and device: Secondary | ICD-10-CM | POA: Diagnosis not present

## 2019-10-20 DIAGNOSIS — K8689 Other specified diseases of pancreas: Secondary | ICD-10-CM

## 2019-10-20 HISTORY — PX: STENT REMOVAL: SHX6421

## 2019-10-20 HISTORY — PX: BILIARY DILATION: SHX6850

## 2019-10-20 HISTORY — PX: REMOVAL OF STONES: SHX5545

## 2019-10-20 HISTORY — PX: BILIARY STENT PLACEMENT: SHX5538

## 2019-10-20 HISTORY — PX: ENDOSCOPIC RETROGRADE CHOLANGIOPANCREATOGRAPHY (ERCP) WITH PROPOFOL: SHX5810

## 2019-10-20 LAB — POCT I-STAT, CHEM 8
BUN: 8 mg/dL (ref 6–20)
Calcium, Ion: 1.17 mmol/L (ref 1.15–1.40)
Chloride: 104 mmol/L (ref 98–111)
Creatinine, Ser: 0.8 mg/dL (ref 0.44–1.00)
Glucose, Bld: 124 mg/dL — ABNORMAL HIGH (ref 70–99)
HCT: 34 % — ABNORMAL LOW (ref 36.0–46.0)
Hemoglobin: 11.6 g/dL — ABNORMAL LOW (ref 12.0–15.0)
Potassium: 3.9 mmol/L (ref 3.5–5.1)
Sodium: 138 mmol/L (ref 135–145)
TCO2: 26 mmol/L (ref 22–32)

## 2019-10-20 LAB — HEPATIC FUNCTION PANEL
ALT: 62 U/L — ABNORMAL HIGH (ref 0–44)
AST: 69 U/L — ABNORMAL HIGH (ref 15–41)
Albumin: 3.1 g/dL — ABNORMAL LOW (ref 3.5–5.0)
Alkaline Phosphatase: 524 U/L — ABNORMAL HIGH (ref 38–126)
Bilirubin, Direct: 0.5 mg/dL — ABNORMAL HIGH (ref 0.0–0.2)
Indirect Bilirubin: 0.4 mg/dL (ref 0.3–0.9)
Total Bilirubin: 0.9 mg/dL (ref 0.3–1.2)
Total Protein: 7.2 g/dL (ref 6.5–8.1)

## 2019-10-20 SURGERY — ENDOSCOPIC RETROGRADE CHOLANGIOPANCREATOGRAPHY (ERCP) WITH PROPOFOL
Anesthesia: General

## 2019-10-20 MED ORDER — CIPROFLOXACIN HCL 500 MG PO TABS
500.0000 mg | ORAL_TABLET | Freq: Two times a day (BID) | ORAL | 0 refills | Status: AC
Start: 2019-10-20 — End: 2019-10-25

## 2019-10-20 MED ORDER — LACTATED RINGERS IV SOLN
INTRAVENOUS | Status: AC | PRN
  Administered 2019-10-20: 1000 mL via INTRAVENOUS

## 2019-10-20 MED ORDER — INDOMETHACIN 50 MG RE SUPP
RECTAL | Status: DC | PRN
  Administered 2019-10-20: 100 mg via RECTAL

## 2019-10-20 MED ORDER — INDOMETHACIN 50 MG RE SUPP
RECTAL | Status: AC
  Filled 2019-10-20: qty 2

## 2019-10-20 MED ORDER — SODIUM CHLORIDE 0.9 % IV SOLN
INTRAVENOUS | Status: DC | PRN
  Administered 2019-10-20: 100 mL

## 2019-10-20 MED ORDER — MIDAZOLAM HCL 2 MG/2ML IJ SOLN
INTRAMUSCULAR | Status: DC | PRN
  Administered 2019-10-20: 2 mg via INTRAVENOUS

## 2019-10-20 MED ORDER — PROPOFOL 10 MG/ML IV BOLUS
INTRAVENOUS | Status: DC | PRN
  Administered 2019-10-20: 140 mg via INTRAVENOUS

## 2019-10-20 MED ORDER — CIPROFLOXACIN IN D5W 400 MG/200ML IV SOLN
INTRAVENOUS | Status: AC
  Filled 2019-10-20: qty 200

## 2019-10-20 MED ORDER — SODIUM CHLORIDE 0.9 % IV SOLN: INTRAVENOUS | Status: DC

## 2019-10-20 MED ORDER — LIDOCAINE 2% (20 MG/ML) 5 ML SYRINGE
INTRAMUSCULAR | Status: DC | PRN
  Administered 2019-10-20: 100 mg via INTRAVENOUS

## 2019-10-20 MED ORDER — FENTANYL CITRATE (PF) 250 MCG/5ML IJ SOLN
INTRAMUSCULAR | Status: DC | PRN
  Administered 2019-10-20: 100 ug via INTRAVENOUS

## 2019-10-20 MED ORDER — GLUCAGON HCL RDNA (DIAGNOSTIC) 1 MG IJ SOLR
INTRAMUSCULAR | Status: AC
  Filled 2019-10-20: qty 1

## 2019-10-20 MED ORDER — CIPROFLOXACIN IN D5W 400 MG/200ML IV SOLN
INTRAVENOUS | Status: DC | PRN
Start: 2019-10-20 — End: 2019-10-20
  Administered 2019-10-20: 400 mg via INTRAVENOUS

## 2019-10-20 MED ORDER — GLUCAGON HCL RDNA (DIAGNOSTIC) 1 MG IJ SOLR
INTRAMUSCULAR | Status: DC | PRN
  Administered 2019-10-20 (×4): .25 mg via INTRAVENOUS

## 2019-10-20 MED ORDER — ONDANSETRON HCL 4 MG/2ML IJ SOLN
INTRAMUSCULAR | Status: DC | PRN
  Administered 2019-10-20: 4 mg via INTRAVENOUS

## 2019-10-20 MED ORDER — SUCCINYLCHOLINE CHLORIDE 200 MG/10ML IV SOSY
PREFILLED_SYRINGE | INTRAVENOUS | Status: DC | PRN
  Administered 2019-10-20: 120 mg via INTRAVENOUS

## 2019-10-20 MED ORDER — DEXAMETHASONE SODIUM PHOSPHATE 10 MG/ML IJ SOLN
INTRAMUSCULAR | Status: DC | PRN
  Administered 2019-10-20: 5 mg via INTRAVENOUS

## 2019-10-20 NOTE — H&P (Signed)
GASTROENTEROLOGY PROCEDURE H&P NOTE   Primary Care Physician: Jasmine Awe., MD  HPI: Krystal Reynolds is a 49 y.o. female who presents for ERCP for BD stent exchange and attempt at prior BD stent removal from history of resection.  Past Medical History:  Diagnosis Date  . Anemia 01-02-12   mostly low iron- tx. oral meds, not in several yrs  . Chronic kidney disease 01-02-12   dx. rt. kidney neoplasm-surgery planned, past hx. protein in urine.  . 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 Depression   . Family history of anesthesia complication    mother slow to wake up   . Leg swelling 01-02-12   occ. episodes of pitting edema lower extremities at end of day.  . Preeclampsia 1998   Resolved  . Pregnancy induced hypertension 1998   Resolved  . Sleep apnea    "dx'd; used mask for ~ 1 yr; lost weight; no longer wear mask" (12/21/2016)  . Thyroid enlargement 01/02/2012   left thyroid enlargement, being watched, S/P [place]; no tx.   Past Surgical History:  Procedure Laterality Date  . BILIARY DILATION  05/05/2019   Procedure: BILIARY DILATION;  Surgeon: Rush Landmark Telford Nab., MD;  Location: [facility];  Service: Gastroenterology;;  . BILIARY STENT PLACEMENT  05/05/2019   Procedure: BILIARY STENT PLACEMENT;  Surgeon: Irving Copas., MD;  Location: [facility];  Service: Gastroenterology;;  . Fidel Levy H PYLORI  09/20/2011   Procedure: BREATH TEK H PYLORI;  Surgeon: Shann Medal, MD;  Location: [facility];  Service: General;  Laterality: N/A;  . childbirth  01-02-12   x2 NVD- pre-exclampsia with pregrancy  . CHOLECYSTECTOMY  11/23/2016   'w/whipple"  . ENDOSCOPIC RETROGRADE CHOLANGIOPANCREATOGRAPHY (ERCP) WITH PROPOFOL N/A 05/05/2019   Procedure: ENDOSCOPIC RETROGRADE CHOLANGIOPANCREATOGRAPHY (ERCP) WITH PROPOFOL;  Surgeon: Rush Landmark Telford Nab., MD;  Location: [facility];  Service:  Gastroenterology;  Laterality: N/A;  . ESOPHAGOGASTRODUODENOSCOPY (EGD) WITH PROPOFOL N/A 05/05/2019   Procedure: ESOPHAGOGASTRODUODENOSCOPY (EGD) WITH PROPOFOL;  Surgeon: Rush Landmark Telford Nab., MD;  Location: [facility];  Service: Gastroenterology;  Laterality: N/A;  . EUS  12/07/2011   Procedure: UPPER ENDOSCOPIC ULTRASOUND (EUS) LINEAR;  Surgeon: Milus Banister, MD;  [facility];  Service: Endoscopy;  Laterality: N/A;  radial linear  . IR RADIOLOGIST EVAL & MGMT  01/11/2017  . LIVER BIOPSY  12/2018  . REMOVAL OF STONES  05/05/2019   Procedure: REMOVAL OF STONES;  Surgeon: Rush Landmark Telford Nab., MD;  Location: [facility];  Service: Gastroenterology;;  . ROBOTIC ASSITED PARTIAL NEPHRECTOMY Left 10/02/2013   Procedure: ROBOTIC ASSITED PARTIAL NEPHRECTOMY;  Surgeon: Raynelle Bring, MD;  Location[facility];  Service: Urology;  Laterality: Left;  . ROBOTIC ASSITED PARTIAL NEPHRECTOMY Right 12/2011  . SUBMUCOSAL TATTOO INJECTION  05/05/2019   Procedure: SUBMUCOSAL TATTOO INJECTION;  Surgeon: Irving Copas., MD;  Location: [facility];  Service: Gastroenterology;;  . VAGINAL HYSTERECTOMY  2008  . WHIPPLE PROCEDURE N/A 11/23/2016   Procedure: WHIPPLE PROCEDURE;  Surgeon: Stark Klein, MD;  Location: [facility];  Service: General;  Laterality: N/A;   No current facility-administered medications for this encounter.   No Known Allergies Family History  Problem Relation Age of Onset  . Cancer Paternal Uncle        lung  . AAA (abdominal aortic aneurysm) Maternal Grandfather        60s-70s   Social History  Socioeconomic History  . Marital status: Married    Spouse name: Not on file  . Number of children: 2  . Years of education: Not on file  . Highest education level: Not on file  Occupational History  . Occupation: Product manager: [employer]  Tobacco Use  . Smoking status: Never Smoker  . Smokeless tobacco: Never Used  Vaping Use  . Vaping Use: Never  used  Substance and Sexual Activity  . Alcohol use: Yes    Comment:  "beer twice/month; if that"  . Drug use: No  . Sexual activity: Not Currently    Birth control/protection: Surgical    Comment: Hysterectomy  Other Topics Concern  . Not on file  Social History Narrative  . Not on file   Social Determinants of Health   Financial Resource Strain:   . Difficulty of Paying Living Expenses:   Food Insecurity:   . Worried About Charity fundraiser in the Last Year:   . Arboriculturist in the Last Year:   Transportation Needs:   . Film/video editor (Medical):   Marland Kitchen Lack of Transportation (Non-Medical):   Physical Activity:   . Days of Exercise per Week:   . Minutes of Exercise per Session:   Stress:   . Feeling of Stress :   Social Connections:   . Frequency of Communication with Friends and Family:   . Frequency of Social Gatherings with Friends and Family:   . Attends Religious Services:   . Active Member of Clubs or Organizations:   . Attends Archivist Meetings:   Marland Kitchen Marital Status:   Intimate Partner Violence:   . Fear of Current or Ex-Partner:   . Emotionally Abused:   Marland Kitchen Physically Abused:   . Sexually Abused:     Physical Exam: Vital signs in last 24 hours:     GEN: NAD EYE: Sclerae anicteric ENT: MMM CV: Non-tachycardic GI: Soft, NT/ND NEURO:  Alert & Oriented x 3  Lab Results: No results for input(s): WBC, HGB, HCT, PLT in the last 72 hours. BMET No results for input(s): NA, K, CL, CO2, GLUCOSE, BUN, CREATININE, CALCIUM in the last 72 hours. LFT No results for input(s): PROT, ALBUMIN, AST, ALT, ALKPHOS, BILITOT, BILIDIR, IBILI in the last 72 hours. PT/INR No results for input(s): LABPROT, INR in the last 72 hours.   Impression / Plan: This is a 49 y.o.female who presents for ERCP for BD stent exchange and attempt at prior BD stent removal from history of resection.  The risks of an ERCP were discussed at length, including but not  limited to the risk of perforation, bleeding, abdominal pain, post-ERCP pancreatitis (while usually mild can be severe and even life threatening).  The risks and benefits of endoscopic evaluation were discussed with the patient; these include but are not limited to the risk of perforation, infection, bleeding, missed lesions, lack of diagnosis, severe illness requiring hospitalization, as well as anesthesia and sedation related illnesses.  The patient is agreeable to proceed.    Justice Britain, MD [facility] Office # [phone]

## 2019-10-20 NOTE — Op Note (Signed)
[facility] Patient Name: Krystal Reynolds Procedure Date : 10/20/2019 MRN: [id number] Attending MD: Justice Britain , MD Date of Birth: 12/25/1970 CSN: [id number] Age: 49 Admit Type: Inpatient Procedure:                ERCP Indications:              Abnormal MRCP, Biliary dilation on magnetic                            resonance cholangiopancreatography, Elevated                            alkaline phosphatase Providers:                Justice Britain, MD, Carlyn Reichert, RN, Lazaro Arms, Technician Referring MD:             Stark Klein MD, MD, Milus Banister, MD Medicines:                General Anesthesia, Cipro 400 mg IV, Indomethacin                            100 mg PR, Glucagon 1 mg IV Complications:            No immediate complications. Estimated Blood Loss:     Estimated blood loss was minimal. Procedure:                Pre-Anesthesia Assessment:                           - Prior to the procedure, a History and Physical                            was performed, and patient medications and                            allergies were reviewed. The patient's tolerance of                            previous anesthesia was also reviewed. The risks                            and benefits of the procedure and the sedation                            options and risks were discussed with the patient.                            All questions were answered, and informed consent                            was obtained. Prior Anticoagulants: The patient has  taken no previous anticoagulant or antiplatelet                            agents. ASA Grade Assessment: III - A patient with                            severe systemic disease. After reviewing the risks                            and benefits, the patient was deemed in                            satisfactory condition to undergo the procedure.                            After obtaining informed consent, the scope was                            passed under direct vision. Throughout the                            procedure, the patient's blood pressure, pulse, and                            oxygen saturations were monitored continuously. The                            [id number]-Q180V ([id number] Olympus Duodensocope was                            introduced through the mouth, and used to inject                            contrast into and used to inject contrast into the                            bile duct. The ERCP was technically difficult and                            complex due to challenging cannulation. Successful                            completion of the procedure was aided by performing                            the maneuvers documented (below) in this report.                            The patient tolerated the procedure. Scope In: Scope Out: Findings:      A biliary stent was visible on the scout film as well as the previously       placed biliary stent that had been placed at the time of her surgery.      The esophagus was  successfully intubated under direct vision without       detailed examination of the pharynx, larynx, and associated structures,       and upper GI tract. Following the previously placed tattoos, the       afferent limb was intubated. One plastic biliary stent originating in       the biliary tree was emerging from the choledochojejunostomy. The stent       was partially occluded. One stent was removed from the biliary tree       using a snare.      A short 0.035 inch Soft Jagwire was passed into the biliary tree and was       in the right hepatic duct. The Autotome sphincterotome was passed over       the guidewire and the bile duct was then deeply cannulated. Contrast was       injected. I personally interpreted the bile duct images. Ductal flow of       contrast was adequate. Image quality was adequate. Contrast  extended to       the hepatic ducts. Opacification of the common hepatic duct and left and       right hepatic ducts and all intrahepatic branches was successful. The       maximum diameter of the ducts was 6 mm. The left main hepatic duct into       the the intrahepatic ducts was moderately dilated. The largest diameter       was 10 mm. The left main hepatic duct contained a single moderate       narrowing approximately 8 mm in length likely leading to the the       dilation upstream. To discover objects, the biliary tree was swept with       a retrieval balloon starting at the right intrahepatic duct and right       main hepatic duct. Sludge was swept from the duct. We maintained the       wire in the right hepatic system. Dilation of the common hepatic duct       with a Hurricane 6 mm balloon dilator was successful. I then proceeded       with attempt at placement of a wire into the left hepatic system. After       switching sphincterotomes and wires from a typical 0.035 Jagwire to a       Revolution Angled 0.025 Jagwire it passed into the biliary tree       successfully. An occlusion cholangiogram was performed that showed no       further significant biliary pathology other than what was noted. I       attempted to proceed with dilation of the narrowing of the left main       hepatic duct. Dilation of the left main hepatic duct with a Hurricane 4       mm balloon dilator was attempted but was unsuccessful as a result of the       angulation of the duct and the balloon caused the wire to fall back into       the right hepatic duct system. After 45 more minutes we were able to get       the wire deeper into the left hepatic duct system. The sphincterotome       was able to traverse the angulation slightly but after I repeat       attempted to get the [facility]  4 mm balloon, it fell again into the       right hepatic duct. Further attempts were not successful at cannulation       and  maintenance of the wire into the left hepatic system. In an effort       of trying to maintain the patency of the choledochojejunostomy, I placed       a larger stent than prior, one 10 Fr by 5 cm transpapillary plastic       biliary stent with a single external flap and a single internal flap was       placed into the right hepatic duct. Bile flowed through the stent. The       stent was in good position.      A pancreatogram was not performed.      The duodenoscope was withdrawn from the patient. Impression:               - One partially occluded stent from the biliary                            tree was seen at the choledochojejunostomy. This                            was removed.                           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 The stricture was                            indeterminate and it had some upstream ductal                            dilation.                           - The biliary tree was swept and sludge was found.                           - The choledochojejunostomy was successfully                            dilated.                           - Could not maintain wire placement in the left                            hepatic system to proceed with dilation or stent                            placement as described above.                           - One plastic biliary stent was placed into the  right hepatic duct and traversed the                            choledochojejunostomy. Recommendation:           - The patient will be observed post-procedure,                            until all discharge criteria are met.                           - Discharge patient to home.                           - Patient has a contact number available for                            emergencies. The signs and symptoms of potential                            delayed  complications were discussed with the                            patient. Return to normal activities tomorrow.                            Written discharge instructions were provided to the                            patient.                           - Watch for pancreatitis, bleeding, perforation,                            and cholangitis.                           - Follow up LFTs drawn earlier today.                           - Low fat diet x72 hours.                           - Ciprofloxacin 500 mg BID x 5-days to decrease                            risk of post-infectious ERCP complications.                           - I think, 1 more attempt with Spyglass                            Cholangioscopy available at next ERCP with hope of  being able to maintain a cannulation of the left                            hepatic duct system and subsequent dilation and                            possible stent placement may be in the patient's                            best interests. The previously place surgical                            biliary stent still remains and even with balloon                            sweeping, it did not move. Hopefully with                            Cholangioscopy, we will have a better ability to                            see if this can be removed endoscopically. If at                            next ERCP, in 3-4 months, we continue to have                            issues, then referral to [facility] center for ERCP                            would be reasonable.                           - Watch for pancreatitis, bleeding, perforation,                            and cholangitis.                           - The findings and recommendations were discussed                            with the patient.                           - The findings and recommendations were discussed                            with the patient's  family. Procedure Code(s):        --- Professional ---                           [phone], Endoscopic retrograde  cholangiopancreatography (ERCP); with removal and                            exchange of stent(s), biliary or pancreatic duct,                            including pre- and post-dilation and guide wire                            passage, when performed, including sphincterotomy,                            when performed, each stent exchanged                           [id number], Endoscopic retrograde                            cholangiopancreatography (ERCP); with removal of                            calculi/debris from biliary/pancreatic duct(s) Diagnosis Code(s):        --- Professional ---                           E17.408X, Other mechanical complication of bile                            duct prosthesis, initial encounter                           K83.1, Obstruction of bile duct                           Z46.59, Encounter for fitting and adjustment of                            other gastrointestinal appliance and device                           K83.9, Disease of biliary tract, unspecified                           R74.8, Abnormal levels of other serum enzymes                           K83.8, Other specified diseases of biliary tract                           R93.2, Abnormal findings on diagnostic imaging of                            liver and biliary tract CPT copyright 2019 [employer]. All rights reserved. The codes documented in this report are preliminary and upon coder review may  be revised to meet current compliance requirements. Justice Britain, MD 10/20/2019 11:30:53 AM Number of  Addenda: 0

## 2019-10-20 NOTE — Anesthesia Postprocedure Evaluation (Signed)
Anesthesia Post Note  Patient: Krystal Reynolds  Procedure(s) Performed: ENDOSCOPIC RETROGRADE CHOLANGIOPANCREATOGRAPHY (ERCP) WITH PROPOFOL (N/A ) STENT REMOVAL REMOVAL OF STONES BILIARY STENT PLACEMENT BILIARY DILATION     Patient location during evaluation: Endoscopy Anesthesia Type: General Level of consciousness: awake Pain management: pain level controlled Vital Signs Assessment: post-procedure vital signs reviewed and stable Respiratory status: spontaneous breathing Cardiovascular status: stable Postop Assessment: no apparent nausea or vomiting Anesthetic complications: no   No complications documented.  Last Vitals:  Vitals:   10/20/19 1145 10/20/19 1147  BP:  (!) 161/85  Pulse: 66 62  Resp: 19 15  Temp:  36.9 C  SpO2: 94% 93%    Last Pain:  Vitals:   10/20/19 1147  TempSrc:   PainSc: 0-No pain                 Renatta Shrieves

## 2019-10-20 NOTE — Anesthesia Procedure Notes (Signed)
Procedure Name: Intubation Date/Time: 10/20/2019 9:06 AM Performed by: Kathryne Hitch, CRNA Pre-anesthesia Checklist: Patient identified, Emergency Drugs available, Suction available and Patient being monitored Patient Re-evaluated:Patient Re-evaluated prior to induction Oxygen Delivery Method: Circle system utilized Preoxygenation: Pre-oxygenation with 100% oxygen Induction Type: IV induction Ventilation: Mask ventilation without difficulty Grade View: Grade II Tube type: Oral Tube size: 7.0 mm Number of attempts: 1 Airway Equipment and Method: Stylet and Oral airway Placement Confirmation: ETT inserted through vocal cords under direct vision,  positive ETCO2 and breath sounds checked- equal and bilateral Secured at: 22 cm Tube secured with: Tape Dental Injury: Teeth and Oropharynx as per pre-operative assessment

## 2019-10-20 NOTE — Transfer of Care (Signed)
Immediate Anesthesia Transfer of Care Note  Patient: Krystal Reynolds  Procedure(s) Performed: ENDOSCOPIC RETROGRADE CHOLANGIOPANCREATOGRAPHY (ERCP) WITH PROPOFOL (N/A ) STENT REMOVAL  Patient Location: PACU  Anesthesia Type:General  Level of Consciousness: drowsy and patient cooperative  Airway & Oxygen Therapy: Patient Spontanous Breathing  Post-op Assessment: Report given to RN and Post -op Vital signs reviewed and stable  Post vital signs: Reviewed and stable  Last Vitals:  Vitals Value Taken Time  BP 133/68 10/20/19 1124  Temp    Pulse 72 10/20/19 1125  Resp 19 10/20/19 1125  SpO2 95 % 10/20/19 1125  Vitals shown include unvalidated device data.  Last Pain:  Vitals:   10/20/19 0753  TempSrc: Oral  PainSc: 0-No pain         Complications: No complications documented.

## 2019-10-20 NOTE — Anesthesia Preprocedure Evaluation (Addendum)
Anesthesia Evaluation  Patient identified by MRN, date of birth, ID band Patient awake    Reviewed: Allergy & Precautions, NPO status , Patient's Chart, lab work & pertinent test results  History of Anesthesia Complications (+) AWARENESS UNDER ANESTHESIA  Airway Mallampati: II  TM Distance: >3 FB     Dental   Pulmonary sleep apnea ,    breath sounds clear to auscultation       Cardiovascular hypertension,  Rhythm:Regular Rate:Normal     Neuro/Psych Depression    GI/Hepatic negative GI ROS, Neg liver ROS,   Endo/Other    Renal/GU Renal disease     Musculoskeletal   Abdominal   Peds  Hematology  (+) anemia ,   Anesthesia Other Findings   Reproductive/Obstetrics                            Anesthesia Physical Anesthesia Plan  ASA: III  Anesthesia Plan: General   Post-op Pain Management:    Induction: Intravenous  PONV Risk Score and Plan: 3 and Ondansetron, Dexamethasone and Propofol infusion  Airway Management Planned: Nasal Cannula and Simple Face Mask  Additional Equipment:   Intra-op Plan:   Post-operative Plan:   Informed Consent:     Dental advisory given  Plan Discussed with: CRNA and Anesthesiologist  Anesthesia Plan Comments:         Anesthesia Quick Evaluation

## 2019-10-23 ENCOUNTER — Encounter (HOSPITAL_COMMUNITY): Payer: Self-pay | Admitting: Gastroenterology

## 2020-01-23 ENCOUNTER — Encounter: Payer: Self-pay | Admitting: Gastroenterology

## 2020-01-23 ENCOUNTER — Other Ambulatory Visit (INDEPENDENT_AMBULATORY_CARE_PROVIDER_SITE_OTHER): Payer: BC Managed Care – PPO

## 2020-01-23 ENCOUNTER — Ambulatory Visit (INDEPENDENT_AMBULATORY_CARE_PROVIDER_SITE_OTHER): Payer: BC Managed Care – PPO | Admitting: Gastroenterology

## 2020-01-23 VITALS — BP 138/80 | HR 67 | Ht 67.0 in | Wt 223.0 lb

## 2020-01-23 DIAGNOSIS — K831 Obstruction of bile duct: Secondary | ICD-10-CM

## 2020-01-23 DIAGNOSIS — Z9889 Other specified postprocedural states: Secondary | ICD-10-CM

## 2020-01-23 DIAGNOSIS — R7989 Other specified abnormal findings of blood chemistry: Secondary | ICD-10-CM | POA: Diagnosis not present

## 2020-01-23 LAB — CBC
HCT: 34.5 % — ABNORMAL LOW (ref 36.0–46.0)
Hemoglobin: 11.5 g/dL — ABNORMAL LOW (ref 12.0–15.0)
MCHC: 33.3 g/dL (ref 30.0–36.0)
MCV: 81.2 fl (ref 78.0–100.0)
Platelets: 314 K/uL (ref 150.0–400.0)
RBC: 4.25 Mil/uL (ref 3.87–5.11)
RDW: 15.2 % (ref 11.5–15.5)
WBC: 7.5 K/uL (ref 4.0–10.5)

## 2020-01-23 LAB — PROTIME-INR
INR: 1 ratio (ref 0.8–1.0)
Prothrombin Time: 11.6 s (ref 9.6–13.1)

## 2020-01-23 NOTE — Patient Instructions (Addendum)
You have been scheduled for an endoscopy. Please follow written instructions given to you at your visit today. If you use inhalers (even only as needed), please bring them with you on the day of your procedure.  Your provider has requested that you go to the basement level for lab work before leaving today. Press "B" on the elevator. The lab is located at the first door on the left as you exit the elevator.  Due to recent changes in healthcare laws, you may see the results of your imaging and laboratory studies on MyChart before your provider has had a chance to review them.  We understand that in some cases there may be results that are confusing or concerning to you. Not all laboratory results come back in the same time frame and the provider may be waiting for multiple results in order to interpret others.  Please give [place] 48 hours in order for your provider to thoroughly review all the results before contacting the office for clarification of your results.   If you are age 21 or older, your body mass index should be between 23-30. Your Body mass index is 34.93 kg/m. If this is out of the aforementioned range listed, please consider follow up with your Primary Care Provider.  If you are age 52 or younger, your body mass index should be between 19-25. Your Body mass index is 34.93 kg/m. If this is out of the aformentioned range listed, please consider follow up with your Primary Care Provider.    Thank you for choosing me and [facility].  Dr. Rush Landmark

## 2020-01-25 ENCOUNTER — Encounter: Payer: Self-pay | Admitting: Gastroenterology

## 2020-01-25 DIAGNOSIS — K831 Obstruction of bile duct: Secondary | ICD-10-CM | POA: Insufficient documentation

## 2020-01-25 DIAGNOSIS — R7989 Other specified abnormal findings of blood chemistry: Secondary | ICD-10-CM | POA: Insufficient documentation

## 2020-01-25 DIAGNOSIS — Z9889 Other specified postprocedural states: Secondary | ICD-10-CM | POA: Insufficient documentation

## 2020-01-25 NOTE — Progress Notes (Signed)
[facility] VISIT   Primary Care Provider [place], [address], [place] [address] [phone]   Patient Profile: Krystal Reynolds is a 49 y.o. 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  The patient presents to the [facility] for an evaluation and management of problem(s) noted below:  Problem List 1. Biliary stricture   2. Elevated liver function tests   3. History of biliary duct stent placement   4. History of pancreatic surgery - Whipple with Choledochojejunostomy   5. History of ERCP     History of Present Illness Please see initial consultation note and progress notes by Dr. Ardis Hughs for full details of HPI.  Interval History The patient returns for follow-up.  She was recently evaluated by [facility] on [facility] September.  She is followed by her and Dr. Ardis Hughs for her chronically elevated LFTs.  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  Her liver tests have down trended in regards to her alkaline phosphatase since her last evaluation in the epic system with her alkaline phosphatase now under 300.  Today, the patient states she is doing very well.  Not having any significant pruritus.  She has stopped her cholestyramine and ursodiol.  She is happy with where things are regards to medical health currently.  She is hopeful that things will continue to be this good in the coming months.  She notes that the biliary stent that we placed needs to be replaced or removed.  She denies any new significant abdominal pain.  Patient denies changes in her bowel habits.  GI Review of Systems Positive as above Negative for  pyrosis, dysphagia, odynophagia, nausea, vomiting, melena, hematochezia  Review of Systems General: Denies fevers/chills/weight loss unintentionally Cardiovascular: Denies chest pain Pulmonary: Denies shortness of breath Gastroenterological: See HPI Genitourinary: Denies darkened urine Hematological: Denies easy bruising/bleeding Dermatological: Denies jaundice Psychological: Mood is stable   Medications Current Outpatient Medications  Medication Sig Dispense Refill  . citalopram (CELEXA) 20 MG tablet Take 20 mg by mouth daily with breakfast.      No current facility-administered medications for this visit.    Allergies No Known Allergies  Histories Past Medical History:  Diagnosis Date  . Anemia 01-02-12   mostly low iron- tx. oral meds, not in several yrs  . Chronic kidney disease 01-02-12   dx. rt. kidney neoplasm-surgery planned, past hx. protein in urine.  . 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 Depression   . Family history of anesthesia complication    mother slow to wake up   . Leg swelling 01-02-12   occ. episodes of pitting edema lower extremities at end of day.  . Preeclampsia 1998   Resolved  . Pregnancy induced hypertension 1998   Resolved  . Sleep apnea    "dx'd; used mask for ~ 1 yr; lost weight; no longer wear mask" (12/21/2016)  . Thyroid enlargement 01/02/2012   left thyroid enlargement, being watched, S/P [place]; no tx.   Past Surgical History:  Procedure Laterality Date  . BILIARY DILATION  05/05/2019   Procedure: BILIARY DILATION;  Surgeon: Mansouraty, Telford Nab., MD;  Location: [facility];  Service:  Gastroenterology;;  . BILIARY DILATION  10/20/2019   Procedure: BILIARY DILATION;  Surgeon: Irving Copas., MD;  Location: [place];  Service: Gastroenterology;;  . BILIARY STENT PLACEMENT  05/05/2019   Procedure: BILIARY STENT PLACEMENT;  Surgeon:  Irving Copas., MD;  Location: [place];  Service: Gastroenterology;;  . BILIARY STENT PLACEMENT  10/20/2019   Procedure: BILIARY STENT PLACEMENT;  Surgeon: Irving Copas., MD;  Location: [place];  Service: Gastroenterology;;  . Fidel Levy H PYLORI  09/20/2011   Procedure: BREATH TEK H PYLORI;  Surgeon: Shann Medal, MD;  Location: [facility];  Service: General;  Laterality: N/A;  . childbirth  01-02-12   x2 NVD- pre-exclampsia with pregrancy  . CHOLECYSTECTOMY  11/23/2016   'w/whipple"  . ENDOSCOPIC RETROGRADE CHOLANGIOPANCREATOGRAPHY (ERCP) WITH PROPOFOL N/A 05/05/2019   Procedure: ENDOSCOPIC RETROGRADE CHOLANGIOPANCREATOGRAPHY (ERCP) WITH PROPOFOL;  Surgeon: Rush Landmark Telford Nab., MD;  Location: [place];  Service: Gastroenterology;  Laterality: N/A;  . ENDOSCOPIC RETROGRADE CHOLANGIOPANCREATOGRAPHY (ERCP) WITH PROPOFOL N/A 10/20/2019   Procedure: ENDOSCOPIC RETROGRADE CHOLANGIOPANCREATOGRAPHY (ERCP) WITH PROPOFOL;  Surgeon: Rush Landmark Telford Nab., MD;  Location: [place];  Service: Gastroenterology;  Laterality: N/A;  . ESOPHAGOGASTRODUODENOSCOPY (EGD) WITH PROPOFOL N/A 05/05/2019   Procedure: ESOPHAGOGASTRODUODENOSCOPY (EGD) WITH PROPOFOL;  Surgeon: Rush Landmark Telford Nab., MD;  Location: [place];  Service: Gastroenterology;  Laterality: N/A;  . EUS  12/07/2011   Procedure: UPPER ENDOSCOPIC ULTRASOUND (EUS) LINEAR;  Surgeon: Milus Banister, MD;  Location: [facility];  Service: Endoscopy;  Laterality: N/A;  radial linear  . IR RADIOLOGIST EVAL & MGMT  01/11/2017  . LIVER BIOPSY  12/2018  . REMOVAL OF STONES  05/05/2019   Procedure: REMOVAL OF STONES;  Surgeon: Rush Landmark Telford Nab., MD;  Location: [place];  Service: Gastroenterology;;  . REMOVAL OF STONES  10/20/2019   Procedure: REMOVAL OF STONES;  Surgeon: Irving Copas., MD;  Location: [place];  Service: Gastroenterology;;  . ROBOTIC ASSITED PARTIAL NEPHRECTOMY Left 10/02/2013    Procedure: ROBOTIC ASSITED PARTIAL NEPHRECTOMY;  Surgeon: Raynelle Bring, MD;  Location: [facility];  Service: Urology;  Laterality: Left;  . ROBOTIC ASSITED PARTIAL NEPHRECTOMY Right 12/2011  . STENT REMOVAL  10/20/2019   Procedure: STENT REMOVAL;  Surgeon: Irving Copas., MD;  Location: [facility];  Service: Gastroenterology;;  . Lia Foyer TATTOO INJECTION  05/05/2019   Procedure: SUBMUCOSAL TATTOO INJECTION;  Surgeon: Irving Copas., MD;  Location: [facility];  Service: Gastroenterology;;  . VAGINAL HYSTERECTOMY  2008  . WHIPPLE PROCEDURE N/A 11/23/2016   Procedure: WHIPPLE PROCEDURE;  Surgeon: Stark Klein, MD;  Location: [place];  Service: General;  Laterality: N/A;   Social History   Socioeconomic History  . Marital status: Married    Spouse name: Not on file  . Number of children: 2  . Years of education: Not on file  . Highest education level: Not on file  Occupational History  . Occupation: Product manager: [employer]  Tobacco Use  . Smoking status: Never Smoker  . Smokeless tobacco: Never Used  Vaping Use  . Vaping Use: Never used  Substance and Sexual Activity  . Alcohol use: Yes    Comment:  "beer twice/month; if that"  . Drug use: No  . Sexual activity: Not Currently    Birth control/protection: Surgical    Comment: Hysterectomy  Other Topics Concern  . Not on file  Social History Narrative  . Not on file   Social Determinants of Health   Financial Resource  Strain:   . Difficulty of Paying Living Expenses: Not on file  Food Insecurity:   . Worried About Charity fundraiser in the Last Year: Not on file  . Ran Out of Food in the Last Year: Not on file  Transportation Needs:   . Lack of Transportation (Medical): Not on file  . Lack of Transportation (Non-Medical): Not on file  Physical Activity:   . Days of Exercise per Week: Not on file  . Minutes of Exercise per Session: Not on file  Stress:   . Feeling of Stress : Not on  file  Social Connections:   . Frequency of Communication with Friends and Family: Not on file  . Frequency of Social Gatherings with Friends and Family: Not on file  . Attends Religious Services: Not on file  . Active Member of Clubs or Organizations: Not on file  . Attends Archivist Meetings: Not on file  . Marital Status: Not on file  Intimate Partner Violence:   . Fear of Current or Ex-Partner: Not on file  . Emotionally Abused: Not on file  . Physically Abused: Not on file  . Sexually Abused: Not on file   Family History  Problem Relation Age of Onset  . Cancer Paternal Uncle        lung  . AAA (abdominal aortic aneurysm) Maternal Grandfather        60s-70s  . Colon cancer Neg Hx   . Esophageal cancer Neg Hx   . Inflammatory bowel disease Neg Hx   . Liver disease Neg Hx   . Pancreatic cancer Neg Hx   . Rectal cancer Neg Hx   . Stomach cancer Neg Hx    I have reviewed her medical, social, and family history in detail and updated the electronic medical record as necessary.    PHYSICAL EXAMINATION  BP 138/80   Pulse 67   Ht 5\' 7"  (1.702 m)   Wt 223 lb (101.2 kg)   LMP  (LMP Unknown)   BMI 34.93 kg/m  Wt Readings from Last 3 Encounters:  01/23/20 223 lb (101.2 kg)  04/30/19 215 lb (97.5 kg)  01/03/19 212 lb (96.2 kg)  GEN: NAD, appears stated age, doesn't appear chronically ill PSYCH: Cooperative, without pressured speech EYE: Conjunctivae pink, sclerae anicteric ENT: MMM CV: Nontachycardic RESP: No audible wheezing GI: NABS, soft, protuberant, rounded, NT/ND, without rebound or guarding MSK/EXT: No lower extremity edema SKIN: No jaundice NEURO:  Alert & Oriented x 3, no focal deficits   REVIEW OF DATA  I reviewed the following data at the time of this encounter:  GI Procedures and Studies  June 2021 ERCP - One partially occluded stent from the biliary tree was seen at the choledochojejunostomy.  This was removed. - A single moderate biliary  stricture was found in the left main hepatic duct. The stricture was indeterminate and it had some upstream ductal dilation. - The biliary tree was swept and sludge was found. - The choledochojejunostomy was successfully dilated. - Could not maintain wire placement in the left hepatic system to proceed with dilation or stent placement as described above. - One plastic biliary stent was placed into the right hepatic duct and traversed the choledochojejunostomy.  Laboratory Studies  Reviewed those in epic  [facility], [facility] labs AST 54 ALT 51 Alkaline phosphatase 264  June 2021 labs AST 69 ALT 62 Alkaline phosphatase 524 T bili 0.9  Imaging Studies  No new relevant  studies to review   ASSESSMENT  Ms. Wilcoxson is a 49 y.o. 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  The patient is seen today for evaluation and management of:  1. Biliary stricture   2. Elevated liver function tests   3. History of biliary duct stent placement   4. History of pancreatic surgery - Whipple with Choledochojejunostomy   5. History of ERCP    The patient is hemodynamically and clinically stable.  Her last ERCP seems to have made some significant progress in her symptoms and her laboratory evaluation.  She has not normalized her LFTs as of yet however.  Based on my findings at last ERCP do think there is an opportunity to at least try one more time here in [place] for [place] to try and remove the persistently noted biliary stent as well as see if we can get into the left hepatic system and try to do some more work in that area in regards to dilatation of the biliary stricturing.  I will plan to have cholangioscopy available to see if that may be of  assistance although angulation and her anatomy may make that difficult if not impossible but certainly worth [place] giving an attempt.  If we are unsuccessful we may still leave her with plastic biliary stenting or see how she does without stenting.  I think at that point evaluation at one of the quaternary centers may be considered.  For now we will hope that things continue to go well as they are.  She is very happy currently.  The greatest impact that we can have is if we improve bile duct stasis then hopefully we will not have progression of the concern for secondary biliary cirrhosis development.  The risks of an ERCP were discussed at length, including but not limited to the risk of perforation, bleeding, abdominal pain, post-ERCP pancreatitis (while usually mild can be severe and even life threatening).  All patient questions were answered to the best of my ability, and the patient agrees to the aforementioned plan of action with follow-up as indicated.     PLAN  Proceed with scheduling ERCP in the coming months for 4 to 66-month follow-up and attempt at biliary stent extraction and cholangioscopy and further dilation as able Preprocedure labs as outlined below   Orders Placed This Encounter  Procedures  . Procedural/ Surgical Case Request: ENDOSCOPIC RETROGRADE CHOLANGIOPANCREATOGRAPHY (ERCP) WITH PROPOFOL  . CBC  . INR/PT  . Ambulatory referral to Gastroenterology    New Prescriptions   No medications on file   Modified Medications   No medications on file    Planned Follow Up No follow-ups on file.   Total Time in Face-to-Face and in Coordination of Care for patient including independent/personal interpretation/review of prior testing, medical history, examination, medication adjustment, communicating results with the patient directly, and documentation with the EHR is 25 minutes.   Justice Britain, MD [facility] Office # [phone]

## 2020-03-18 ENCOUNTER — Other Ambulatory Visit (HOSPITAL_COMMUNITY)
Admission: RE | Admit: 2020-03-18 | Discharge: 2020-03-18 | Disposition: A | Discharge status: Home / Self Care | Payer: BC Managed Care – PPO | Source: Ambulatory Visit | Attending: Gastroenterology | Admitting: Gastroenterology

## 2020-03-18 DIAGNOSIS — Z20822 Contact with and (suspected) exposure to covid-19: Secondary | ICD-10-CM | POA: Diagnosis not present

## 2020-03-18 DIAGNOSIS — Z01812 Encounter for preprocedural laboratory examination: Secondary | ICD-10-CM | POA: Insufficient documentation

## 2020-03-19 LAB — SARS CORONAVIRUS 2 (TAT 6-24 HRS): SARS Coronavirus 2: NEGATIVE

## 2020-03-19 NOTE — Progress Notes (Signed)
Unable to reach pt by phone for pre-op call. Left detailed pre-op instructions on pt's voicemail   Covid test done 03/18/20 and it's negative. I instructed her that she needs to be in quarantine until she comes to the hospital on Monday.

## 2020-03-22 ENCOUNTER — Ambulatory Visit (HOSPITAL_COMMUNITY): Payer: BC Managed Care – PPO | Admitting: Anesthesiology

## 2020-03-22 ENCOUNTER — Encounter (HOSPITAL_COMMUNITY)
Admission: RE | Disposition: A | Discharge status: Home / Self Care | Payer: Self-pay | Source: Home / Self Care | Attending: Gastroenterology

## 2020-03-22 ENCOUNTER — Ambulatory Visit (HOSPITAL_COMMUNITY): Payer: BC Managed Care – PPO

## 2020-03-22 ENCOUNTER — Encounter (HOSPITAL_COMMUNITY): Payer: Self-pay | Admitting: Gastroenterology

## 2020-03-22 ENCOUNTER — Other Ambulatory Visit: Payer: Self-pay

## 2020-03-22 ENCOUNTER — Telehealth: Payer: Self-pay

## 2020-03-22 ENCOUNTER — Ambulatory Visit (HOSPITAL_COMMUNITY)
Admission: RE | Admit: 2020-03-22 | Discharge: 2020-03-22 | Disposition: A | Discharge status: Home / Self Care | Payer: BC Managed Care – PPO | Attending: Gastroenterology | Admitting: Gastroenterology

## 2020-03-22 DIAGNOSIS — Z4659 Encounter for fitting and adjustment of other gastrointestinal appliance and device: Secondary | ICD-10-CM | POA: Diagnosis not present

## 2020-03-22 DIAGNOSIS — Z801 Family history of malignant neoplasm of trachea, bronchus and lung: Secondary | ICD-10-CM | POA: Insufficient documentation

## 2020-03-22 DIAGNOSIS — E010 Iodine-deficiency related diffuse (endemic) goiter: Secondary | ICD-10-CM | POA: Insufficient documentation

## 2020-03-22 DIAGNOSIS — Z8249 Family history of ischemic heart disease and other diseases of the circulatory system: Secondary | ICD-10-CM | POA: Diagnosis not present

## 2020-03-22 DIAGNOSIS — Z980 Intestinal bypass and anastomosis status: Secondary | ICD-10-CM | POA: Insufficient documentation

## 2020-03-22 DIAGNOSIS — I129 Hypertensive chronic kidney disease with stage 1 through stage 4 chronic kidney disease, or unspecified chronic kidney disease: Secondary | ICD-10-CM | POA: Insufficient documentation

## 2020-03-22 DIAGNOSIS — E669 Obesity, unspecified: Secondary | ICD-10-CM | POA: Insufficient documentation

## 2020-03-22 DIAGNOSIS — Z9049 Acquired absence of other specified parts of digestive tract: Secondary | ICD-10-CM | POA: Diagnosis not present

## 2020-03-22 DIAGNOSIS — Z905 Acquired absence of kidney: Secondary | ICD-10-CM | POA: Diagnosis not present

## 2020-03-22 DIAGNOSIS — K831 Obstruction of bile duct: Secondary | ICD-10-CM | POA: Diagnosis present

## 2020-03-22 DIAGNOSIS — Z85528 Personal history of other malignant neoplasm of kidney: Secondary | ICD-10-CM | POA: Diagnosis not present

## 2020-03-22 DIAGNOSIS — Z6834 Body mass index (BMI) 34.0-34.9, adult: Secondary | ICD-10-CM | POA: Diagnosis not present

## 2020-03-22 DIAGNOSIS — N189 Chronic kidney disease, unspecified: Secondary | ICD-10-CM | POA: Diagnosis not present

## 2020-03-22 DIAGNOSIS — I10 Essential (primary) hypertension: Secondary | ICD-10-CM | POA: Insufficient documentation

## 2020-03-22 DIAGNOSIS — Z9889 Other specified postprocedural states: Secondary | ICD-10-CM

## 2020-03-22 HISTORY — PX: BILIARY DILATION: SHX6850

## 2020-03-22 HISTORY — PX: REMOVAL OF STONES: SHX5545

## 2020-03-22 HISTORY — PX: ENDOSCOPIC RETROGRADE CHOLANGIOPANCREATOGRAPHY (ERCP) WITH PROPOFOL: SHX5810

## 2020-03-22 HISTORY — PX: STENT REMOVAL: SHX6421

## 2020-03-22 SURGERY — ENDOSCOPIC RETROGRADE CHOLANGIOPANCREATOGRAPHY (ERCP) WITH PROPOFOL
Anesthesia: General

## 2020-03-22 MED ORDER — MIDAZOLAM HCL 2 MG/2ML IJ SOLN
INTRAMUSCULAR | Status: DC | PRN
  Administered 2020-03-22: 2 mg via INTRAVENOUS

## 2020-03-22 MED ORDER — GLUCAGON HCL RDNA (DIAGNOSTIC) 1 MG IJ SOLR
INTRAMUSCULAR | Status: AC
  Filled 2020-03-22: qty 2

## 2020-03-22 MED ORDER — SUCCINYLCHOLINE CHLORIDE 200 MG/10ML IV SOSY
PREFILLED_SYRINGE | INTRAVENOUS | Status: DC | PRN
  Administered 2020-03-22: 120 mg via INTRAVENOUS

## 2020-03-22 MED ORDER — FENTANYL CITRATE (PF) 100 MCG/2ML IJ SOLN
INTRAMUSCULAR | Status: DC | PRN
  Administered 2020-03-22: 100 ug via INTRAVENOUS

## 2020-03-22 MED ORDER — SODIUM CHLORIDE 0.9 % IV SOLN
INTRAVENOUS | Status: DC | PRN
  Administered 2020-03-22: 30 mL

## 2020-03-22 MED ORDER — EPHEDRINE SULFATE-NACL 50-0.9 MG/10ML-% IV SOSY
PREFILLED_SYRINGE | INTRAVENOUS | Status: DC | PRN
  Administered 2020-03-22: 10 mg via INTRAVENOUS

## 2020-03-22 MED ORDER — PROPOFOL 10 MG/ML IV BOLUS
INTRAVENOUS | Status: DC | PRN
  Administered 2020-03-22: 170 mg via INTRAVENOUS

## 2020-03-22 MED ORDER — ONDANSETRON HCL 4 MG/2ML IJ SOLN
INTRAMUSCULAR | Status: DC | PRN
  Administered 2020-03-22: 4 mg via INTRAVENOUS

## 2020-03-22 MED ORDER — AMOXICILLIN-POT CLAVULANATE 875-125 MG PO TABS: 1.0000 | ORAL_TABLET | Freq: Two times a day (BID) | ORAL | 0 refills | Status: AC

## 2020-03-22 MED ORDER — DEXAMETHASONE SODIUM PHOSPHATE 10 MG/ML IJ SOLN
INTRAMUSCULAR | Status: DC | PRN
  Administered 2020-03-22: 10 mg via INTRAVENOUS

## 2020-03-22 MED ORDER — GLUCAGON HCL RDNA (DIAGNOSTIC) 1 MG IJ SOLR
INTRAMUSCULAR | Status: DC | PRN
  Administered 2020-03-22 (×6): .25 mg via INTRAVENOUS

## 2020-03-22 MED ORDER — INDOMETHACIN 50 MG RE SUPP
RECTAL | Status: DC | PRN
  Administered 2020-03-22: 100 mg via RECTAL

## 2020-03-22 MED ORDER — LACTATED RINGERS IV SOLN: INTRAVENOUS | Status: DC

## 2020-03-22 MED ORDER — SODIUM CHLORIDE 0.9 % IV SOLN
1.5000 g | INTRAVENOUS | Status: AC
  Administered 2020-03-22: 3 g via INTRAVENOUS
  Filled 2020-03-22: qty 4

## 2020-03-22 MED ORDER — LIDOCAINE 2% (20 MG/ML) 5 ML SYRINGE
INTRAMUSCULAR | Status: DC | PRN
  Administered 2020-03-22: 100 mg via INTRAVENOUS

## 2020-03-22 MED ORDER — INDOMETHACIN 50 MG RE SUPP
RECTAL | Status: AC
  Filled 2020-03-22: qty 2

## 2020-03-22 MED ORDER — SODIUM CHLORIDE 0.9 % IV SOLN
INTRAVENOUS | Status: AC
  Filled 2020-03-22: qty 8

## 2020-03-22 NOTE — Op Note (Signed)
[facility] Patient Name: Krystal Reynolds Procedure Date : 03/22/2020 MRN: [id number] Attending MD: Justice Britain , MD Date of Birth: [birth year] CSN: [id number] Age: 50 Admit Type: Outpatient Procedure:                ERCP Indications:              Common bile duct stricture, Elevated alkaline                            phosphatase, Stent change, Biliary stent removal Providers:                Justice Britain, MD, Baird Cancer, RN, Laverda Sorenson, Technician Referring MD:             Milus Banister, MD, Stark Klein MD, MD, Roosevelt Locks Medicines:                Monitored Anesthesia Care, Unasyn 3 g IV,                            Indomethacin 100 mg PR, Glucagon 1.5 mg IV Complications:            No immediate complications. Estimated Blood Loss:     Estimated blood loss was minimal. Procedure:                Pre-Anesthesia Assessment:                           - Prior to the procedure, a History and Physical                            was performed, and patient medications and                            allergies were reviewed. The patient's tolerance of                            previous anesthesia was also reviewed. The risks                            and benefits of the procedure and the sedation                            options and risks were discussed with the patient.                            All questions were answered, and informed consent                            was obtained. Prior Anticoagulants: The patient has  taken no previous anticoagulant or antiplatelet                            agents. ASA Grade Assessment: II - A patient with                            mild systemic disease. After reviewing the risks                            and benefits, the patient was deemed in                            satisfactory condition to undergo the procedure.                            After obtaining informed consent, the scope was                            passed under direct vision. Throughout the                            procedure, the patient's blood pressure, pulse, and                            oxygen saturations were monitored continuously. The                            TJF- Q180V ([id number]) Olympus duodenoscope was                            introduced through the mouth, and used to inject                            contrast into and used to inject contrast into the                            bile duct. The PCF-H190DL ([id number]) Olympus                            pediatric colonoscope was introduced through the                            mouth, and used to inject contrast into and used to                            locate the major papilla. The ERCP was technically                            difficult and complex due to post-surgical anatomy.                            Successful completion of the procedure was aided by  performing the maneuvers documented (below) in this                            report. The patient tolerated the procedure. Scope In: Scope Out: Findings:      Biliary stents were visible on the scout film. The esophagus was       successfully intubated under direct vision without detailed examination       of the pharynx, larynx, and associated structures. The upper GI tract       was traversed under direct vision without detailed examination. No gross       lesions were noted in the entire esophagus. Evidence of a       gastrojejunostomy was found in the gastric body. This was characterized       by healthy appearing mucosa. Normal mucosa was found in the jejunum in       the efferent limb. Normal mucosa was found in the jejunum in the       afferent limb (noted with tattoo in the region to demarcate). Two       plastic biliary stents originating in the biliary tree were emerging       from the major  papilla. One of the stents was totally occluded (the one       that had been in place since surgery) and the other stent was partially       occluded (the one that I had placed previously). Both of the stents were       removed from the biliary tree using a Raptor grasping device and snare.      After significant issues with trying ot get into the biliary-enteric       anastomosis with the normal wire and sphincterotomes eventually, a short       0.025 inch Revolution jagwire was passed into the biliary tree over the       Jagtome sphincterotome was passed over the guidewire and the bile duct       was then deeply cannulated. Contrast was injected. I personally       interpreted the bile duct images. Ductal flow of contrast was adequate.       Image quality was adequate. Contrast extended to the hepatic ducts. The       common hepatic duct contained filling defects thought to be sludge. The       CHD was noted to be approximately 5 mm. The left main hepatic duct was       moderately dilated to 10 mm. To discover objects, the biliary tree was       swept with a retrieval balloon starting at the bifurcation and right       main hepatic duct. Sludge was swept from the duct. An occlusion       cholangiogram was performed that showed no further significant biliary       pathology. Dilation of the biliary-enteric anastomosis with a Hurricane       6 mm balloon dilator was successful. Bile flowed nicely. Unfortunately,       after more than 45 minutes of trying to access the left hepatic system,       I could not cannulate that area of the biliary tree.      A pancreatogram was not performed.      The duodenoscope was withdrawn from the patient. Impression:               -  No gross lesions in esophagus.                           - A gastrojejunostomy was found, characterized by                            healthy appearing mucosa.                           - Normal mucosa was found in the afferent  and                            efferent jejunum.                           - Two totally occluded stents from the biliary tree                            were seen in the major papilla. These were both                            removed.                           - The fluroscopic examination was suspicious for                            sludge.                           - The biliary-enteric anastomosis was dilated.                           - The left main hepatic duct was moderately dilated.                           - Sludge was removed from the biliary tree.                           - Unable to enter/access the left hepatic system                            after significant attempts. Recommendation:           - The patient will be observed post-procedure,                            until all discharge criteria are met.                           - Discharge patient to home.                           - Low fat diet.                           - Check liver enzymes (  AST, ALT, alkaline                            phosphatase, bilirubin) in 2 weeks.                           - Observe patient's clinical course.                           - Augmentin (amoxicillin/clavulanate) 875 mg PO BID                            for 3 days.                           - Watch for pancreatitis, bleeding, perforation,                            and cholangitis.                           - If patient's symptoms (pruritus) and LFTs (AP in                            particular) begin to rise again, would recommend a                            MRI/MRCP. If issues of the left hepatic system are                            still present, then may consider [facility]                            evaluation for 1 more attempt at ERCP and access of                            the left system.                           - The findings and recommendations were discussed                            with the patient.                            - The findings and recommendations were discussed                            with the patient's family. Procedure Code(s):        --- Professional ---                           [phone], Endoscopic retrograde                            cholangiopancreatography (ERCP); with removal of  foreign body(s) or stent(s) from biliary/pancreatic                            duct(s)                           43264, Endoscopic retrograde                            cholangiopancreatography (ERCP); with removal of                            calculi/debris from biliary/pancreatic duct(s) Diagnosis Code(s):        --- Professional ---                           Z98.0, Intestinal bypass and anastomosis status                           T85.590A, Other mechanical complication of bile                            duct prosthesis, initial encounter                           Z46.59, Encounter for fitting and adjustment of                            other gastrointestinal appliance and device                           K83.1, Obstruction of bile duct                           R74.8, Abnormal levels of other serum enzymes                           K83.8, Other specified diseases of biliary tract CPT copyright 2019 American Medical Association. All rights reserved. The codes documented in this report are preliminary and upon coder review may  be revised to meet current compliance requirements. Justice Britain, MD 03/22/2020 10:13:09 AM Number of Addenda: 0

## 2020-03-22 NOTE — Transfer of Care (Signed)
Immediate Anesthesia Transfer of Care Note  Patient: STEVEN VEAZIE  Procedure(s) Performed: ENDOSCOPIC RETROGRADE CHOLANGIOPANCREATOGRAPHY (ERCP) WITH PROPOFOL (N/A ) STENT REMOVAL BILIARY DILATION REMOVAL OF STONES  Patient Location: PACU  Anesthesia Type:General  Level of Consciousness: drowsy and patient cooperative  Airway & Oxygen Therapy: Patient Spontanous Breathing and Patient connected to face mask oxygen  Post-op Assessment: Report given to RN and Post -op Vital signs reviewed and stable  Post vital signs: Reviewed and stable  Last Vitals:  Vitals Value Taken Time  BP 167/79 03/22/20 0952  Temp    Pulse 73 03/22/20 0953  Resp 19 03/22/20 0953  SpO2 98 % 03/22/20 0953  Vitals shown include unvalidated device data.  Last Pain:  Vitals:   03/22/20 0702  TempSrc: Oral  PainSc: 0-No pain         Complications: No complications documented.

## 2020-03-22 NOTE — H&P (Signed)
GASTROENTEROLOGY PROCEDURE H&P NOTE   Primary Care Physician: Alyson Ingles, MD  HPI: Krystal Reynolds is a 49 y.o. female who presents for ERCP with stent exchange/removal and attempt at biliary stent removal from the previously placed stent years ago and to try and dilate the left hepatic duct if possible.  Past Medical History:  Diagnosis Date  . Anemia 01-02-12   mostly low iron- tx. oral meds, not in several yrs  . Chronic kidney disease 01-02-12   dx. rt. kidney neoplasm-surgery planned, past hx. protein in urine.  . 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 Depression   . Family history of anesthesia complication    mother slow to wake up   . Leg swelling 01-02-12   occ. episodes of pitting edema lower extremities at end of day.  . Preeclampsia 1998   Resolved  . Pregnancy induced hypertension 1998   Resolved  . Sleep apnea    "dx'd; used mask for ~ 1 yr; lost weight; no longer wear mask" (12/21/2016)  . Thyroid enlargement 01/02/2012   left thyroid enlargement, being watched, S/P [place]; no tx.   Past Surgical History:  Procedure Laterality Date  . BILIARY DILATION  05/05/2019   Procedure: BILIARY DILATION;  Surgeon: Rush Landmark Telford Nab., MD;  Location: [place];  Service: Gastroenterology;;  . BILIARY DILATION  10/20/2019   Procedure: BILIARY DILATION;  Surgeon: Irving Copas., MD;  Location: [place];  Service: Gastroenterology;;  . BILIARY STENT PLACEMENT  05/05/2019   Procedure: BILIARY STENT PLACEMENT;  Surgeon: Irving Copas., MD;  Location: [place];  Service: Gastroenterology;;  . BILIARY STENT PLACEMENT  10/20/2019   Procedure: BILIARY STENT PLACEMENT;  Surgeon: Irving Copas., MD;  Location: [place];  Service: Gastroenterology;;  . Fidel Levy H PYLORI  09/20/2011   Procedure: BREATH TEK H PYLORI;  Surgeon: Shann Medal, MD;   Location: [facility];  Service: General;  Laterality: N/A;  . childbirth  01-02-12   x2 NVD- pre-exclampsia with pregrancy  . CHOLECYSTECTOMY  11/23/2016   'w/whipple"  . ENDOSCOPIC RETROGRADE CHOLANGIOPANCREATOGRAPHY (ERCP) WITH PROPOFOL N/A 05/05/2019   Procedure: ENDOSCOPIC RETROGRADE CHOLANGIOPANCREATOGRAPHY (ERCP) WITH PROPOFOL;  Surgeon: Rush Landmark Telford Nab., MD;  Location: [place];  Service: Gastroenterology;  Laterality: N/A;  . ENDOSCOPIC RETROGRADE CHOLANGIOPANCREATOGRAPHY (ERCP) WITH PROPOFOL N/A 10/20/2019   Procedure: ENDOSCOPIC RETROGRADE CHOLANGIOPANCREATOGRAPHY (ERCP) WITH PROPOFOL;  Surgeon: Rush Landmark Telford Nab., MD;  Location: [place];  Service: Gastroenterology;  Laterality: N/A;  . ESOPHAGOGASTRODUODENOSCOPY (EGD) WITH PROPOFOL N/A 05/05/2019   Procedure: ESOPHAGOGASTRODUODENOSCOPY (EGD) WITH PROPOFOL;  Surgeon: Rush Landmark Telford Nab., MD;  Location: [place];  Service: Gastroenterology;  Laterality: N/A;  . EUS  12/07/2011   Procedure: UPPER ENDOSCOPIC ULTRASOUND (EUS) LINEAR;  Surgeon: Milus Banister, MD;  [facility]: [facility];  Service: Endoscopy;  Laterality: N/A;  radial linear  . IR RADIOLOGIST EVAL & MGMT  01/11/2017  . LIVER BIOPSY  12/2018  . REMOVAL OF STONES  05/05/2019   Procedure: REMOVAL OF STONES;  Surgeon: Rush Landmark Telford Nab., MD;  Location: [place];  Service: Gastroenterology;;  . REMOVAL OF STONES  10/20/2019   Procedure: REMOVAL OF STONES;  Surgeon: Irving Copas., MD;  Location: [place];  Service: Gastroenterology;;  . ROBOTIC ASSITED PARTIAL NEPHRECTOMY Left 10/02/2013   Procedure: ROBOTIC ASSITED PARTIAL NEPHRECTOMY;  Surgeon: Raynelle Bring, MD;  Location: [facility];  Service: Urology;  Laterality: Left;  .  ROBOTIC ASSITED PARTIAL NEPHRECTOMY Right 12/2011  . STENT REMOVAL  10/20/2019   Procedure: STENT REMOVAL;  Surgeon: Irving Copas., MD;  Location: [place];  Service: Gastroenterology;;  . Lia Foyer  TATTOO INJECTION  05/05/2019   Procedure: SUBMUCOSAL TATTOO INJECTION;  Surgeon: Irving Copas., MD;  Location: [facility];  Service: Gastroenterology;;  . VAGINAL HYSTERECTOMY  2008  . WHIPPLE PROCEDURE N/A 11/23/2016   Procedure: WHIPPLE PROCEDURE;  Surgeon: Stark Klein, MD;  Location: [facility];  Service: General;  Laterality: N/A;   Current Facility-Administered Medications  Medication Dose Route Frequency Provider Last Rate Last Admin  . ampicillin-sulbactam (UNASYN) 1.5 g in sodium chloride 0.9 % 100 mL IVPB  1.5 g Intravenous To SSTC Mansouraty, Telford Nab., MD      . lactated ringers infusion   Intravenous Continuous Mansouraty, Telford Nab., MD 20 mL/hr at 03/22/20 0716 Continued from Pre-op at 03/22/20 0716   No Known Allergies Family History  Problem Relation Age of Onset  . Cancer Paternal Uncle        lung  . AAA (abdominal aortic aneurysm) Maternal Grandfather        60s-70s  . Colon cancer Neg Hx   . Esophageal cancer Neg Hx   . Inflammatory bowel disease Neg Hx   . Liver disease Neg Hx   . Pancreatic cancer Neg Hx   . Rectal cancer Neg Hx   . Stomach cancer Neg Hx    Social History   Socioeconomic History  . Marital status: Married    Spouse name: Not on file  . Number of children: 2  . Years of education: Not on file  . Highest education level: Not on file  Occupational History  . Occupation: Product manager: [employer]  Tobacco Use  . Smoking status: Never Smoker  . Smokeless tobacco: Never Used  Vaping Use  . Vaping Use: Never used  Substance and Sexual Activity  . Alcohol use: Yes    Comment:  "beer twice/month; if that"  . Drug use: No  . Sexual activity: Not Currently    Birth control/protection: Surgical    Comment: Hysterectomy  Other Topics Concern  . Not on file  Social History Narrative  . Not on file   Social Determinants of Health   Financial Resource Strain:   . Difficulty of Paying Living Expenses: Not on  file  Food Insecurity:   . Worried About Charity fundraiser in the Last Year: Not on file  . Ran Out of Food in the Last Year: Not on file  Transportation Needs:   . Lack of Transportation (Medical): Not on file  . Lack of Transportation (Non-Medical): Not on file  Physical Activity:   . Days of Exercise per Week: Not on file  . Minutes of Exercise per Session: Not on file  Stress:   . Feeling of Stress : Not on file  Social Connections:   . Frequency of Communication with Friends and Family: Not on file  . Frequency of Social Gatherings with Friends and Family: Not on file  . Attends Religious Services: Not on file  . Active Member of Clubs or Organizations: Not on file  . Attends Archivist Meetings: Not on file  . Marital Status: Not on file  Intimate Partner Violence:   . Fear of Current or Ex-Partner: Not on file  . Emotionally Abused: Not on file  . Physically Abused: Not on file  . Sexually Abused: Not  on file    Physical Exam: Vital signs in last 24 hours: Temp:  [98.5 F (36.9 C)] 98.5 F (36.9 C) (11/22 0702) Pulse Rate:  [60] 60 (11/22 0702) Resp:  [18] 18 (11/22 0702) BP: (192)/(76) 192/76 (11/22 0702) SpO2:  [97 %] 97 % (11/22 0702)   GEN: NAD EYE: Sclerae anicteric ENT: MMM CV: Non-tachycardic GI: Soft, NT/ND NEURO:  Alert & Oriented x 3  Lab Results: No results for input(s): WBC, HGB, HCT, PLT in the last 72 hours. BMET No results for input(s): NA, K, CL, CO2, GLUCOSE, BUN, CREATININE, CALCIUM in the last 72 hours. LFT No results for input(s): PROT, ALBUMIN, AST, ALT, ALKPHOS, BILITOT, BILIDIR, IBILI in the last 72 hours. PT/INR No results for input(s): LABPROT, INR in the last 72 hours.   Impression / Plan: This is a 49 y.o.female who presents for ERCP with stent exchange/removal and attempt at biliary stent removal from the previously placed stent years ago and to try and dilate the left hepatic duct if possible.  The risks of an  ERCP were discussed at length, including but not limited to the risk of perforation, bleeding, abdominal pain, post-ERCP pancreatitis (while usually mild can be severe and even life threatening).  The risks and benefits of endoscopic evaluation were discussed with the patient; these include but are not limited to the risk of perforation, infection, bleeding, missed lesions, lack of diagnosis, severe illness requiring hospitalization, as well as anesthesia and sedation related illnesses.  The patient is agreeable to proceed.    Justice Britain, MD [facility] Advanced [facility]oscopy Office # [phone]

## 2020-03-22 NOTE — Anesthesia Procedure Notes (Signed)
Procedure Name: Intubation Date/Time: 03/22/2020 7:38 AM Performed by: Genelle Bal, CRNA Pre-anesthesia Checklist: Patient identified, Emergency Drugs available, Suction available and Patient being monitored Patient Re-evaluated:Patient Re-evaluated prior to induction Oxygen Delivery Method: Circle system utilized Preoxygenation: Pre-oxygenation with 100% oxygen Induction Type: IV induction Ventilation: Mask ventilation without difficulty Laryngoscope Size: Miller and 2 Grade View: Grade II Tube type: Oral Tube size: 7.0 mm Number of attempts: 1 Airway Equipment and Method: Stylet and Oral airway Placement Confirmation: ETT inserted through vocal cords under direct vision,  positive ETCO2 and breath sounds checked- equal and bilateral Secured at: 21 cm Tube secured with: Tape Dental Injury: Teeth and Oropharynx as per pre-operative assessment

## 2020-03-22 NOTE — Telephone Encounter (Signed)
Lab order has been entered. The pt has been advised.

## 2020-03-22 NOTE — Telephone Encounter (Signed)
-----   Message from Irving Copas., MD sent at 03/22/2020 12:06 PM EST ----- FB and DJ and DD, I was able to get my stent out and also the retained bile duct stent that has been there since surgery. I dilated the biliary-enteric anastomosis again.  Multiple attempts did not allow me to place a wire into the left system and if you look at the ERCP imaging it does look like it is still dilated but I do not think it is to the degree previously. Let's plan for repeat labs in 2-weeks to see how the LFT pattern looks. If issues persist then will plan to consider repeat MRI/MRCP to see how things look and may then decide next steps. Fingers crossed this will be enough with the stents out and the anastomosis dilated. Thanks for letting me be a part of her care. GM

## 2020-03-22 NOTE — Anesthesia Preprocedure Evaluation (Addendum)
Anesthesia Evaluation  Patient identified by MRN, date of birth, ID band Patient awake    Reviewed: Allergy & Precautions, NPO status , Patient's Chart, lab work & pertinent test results  Airway Mallampati: II  TM Distance: >3 FB Neck ROM: Full    Dental  (+) Teeth Intact   Pulmonary sleep apnea ,    Pulmonary exam normal breath sounds clear to auscultation       Cardiovascular hypertension, Normal cardiovascular exam Rhythm:Regular Rate:Normal     Neuro/Psych PSYCHIATRIC DISORDERS Depression negative neurological ROS     GI/Hepatic negative GI ROS, Neg liver ROS,   Endo/Other  Obesity   Renal/GU Renal InsufficiencyRenal disease     Musculoskeletal negative musculoskeletal ROS (+)   Abdominal   Peds  Hematology negative hematology ROS (+)   Anesthesia Other Findings Day of surgery medications reviewed with the patient.  Reproductive/Obstetrics                             Anesthesia Physical Anesthesia Plan  ASA: II  Anesthesia Plan: General   Post-op Pain Management:    Induction: Intravenous  PONV Risk Score and Plan: 3 and Midazolam, Ondansetron and Dexamethasone  Airway Management Planned: Oral ETT  Additional Equipment:   Intra-op Plan:   Post-operative Plan: Extubation in OR  Informed Consent: I have reviewed the patients History and Physical, chart, labs and discussed the procedure including the risks, benefits and alternatives for the proposed anesthesia with the patient or authorized representative who has indicated his/her understanding and acceptance.       Plan Discussed with: CRNA  Anesthesia Plan Comments:         Anesthesia Quick Evaluation

## 2020-03-22 NOTE — Anesthesia Postprocedure Evaluation (Signed)
Anesthesia Post Note  Patient: Krystal Reynolds  Procedure(s) Performed: ENDOSCOPIC RETROGRADE CHOLANGIOPANCREATOGRAPHY (ERCP) WITH PROPOFOL (N/A ) STENT REMOVAL BILIARY DILATION REMOVAL OF STONES     Patient location during evaluation: Endoscopy Anesthesia Type: General Level of consciousness: awake and alert Pain management: pain level controlled Vital Signs Assessment: post-procedure vital signs reviewed and stable Respiratory status: spontaneous breathing, nonlabored ventilation and respiratory function stable Cardiovascular status: blood pressure returned to baseline and stable Postop Assessment: no apparent nausea or vomiting Anesthetic complications: no   No complications documented.  Last Vitals:  Vitals:   03/22/20 0702 03/22/20 0951  BP: (!) 192/76 (!) 167/79  Pulse: 60   Resp: 18   Temp: 36.9 C 36.4 C  SpO2: 97% 98%    Last Pain:  Vitals:   03/22/20 0951  TempSrc:   PainSc: Asleep                 Catalina Gravel

## 2020-03-24 ENCOUNTER — Encounter (HOSPITAL_COMMUNITY): Payer: Self-pay | Admitting: Gastroenterology

## 2020-04-06 ENCOUNTER — Other Ambulatory Visit (INDEPENDENT_AMBULATORY_CARE_PROVIDER_SITE_OTHER): Payer: BC Managed Care – PPO

## 2020-04-06 DIAGNOSIS — K831 Obstruction of bile duct: Secondary | ICD-10-CM | POA: Diagnosis not present

## 2020-04-06 LAB — HEPATIC FUNCTION PANEL
ALT: 34 U/L (ref 0–35)
AST: 32 U/L (ref 0–37)
Albumin: 3.8 g/dL (ref 3.5–5.2)
Alkaline Phosphatase: 236 U/L — ABNORMAL HIGH (ref 39–117)
Bilirubin, Direct: 0.1 mg/dL (ref 0.0–0.3)
Total Bilirubin: 0.5 mg/dL (ref 0.2–1.2)
Total Protein: 7.2 g/dL (ref 6.0–8.3)

## 2020-04-09 ENCOUNTER — Other Ambulatory Visit: Payer: Self-pay | Admitting: Gastroenterology

## 2020-04-09 DIAGNOSIS — R7989 Other specified abnormal findings of blood chemistry: Secondary | ICD-10-CM

## 2020-04-12 ENCOUNTER — Other Ambulatory Visit: Payer: Self-pay

## 2020-04-12 DIAGNOSIS — R7989 Other specified abnormal findings of blood chemistry: Secondary | ICD-10-CM

## 2020-04-13 ENCOUNTER — Telehealth: Payer: Self-pay | Admitting: Gastroenterology

## 2020-04-13 NOTE — Telephone Encounter (Signed)
The pt has confirmed that she has the appt and will have labs completed prior to that appt.

## 2020-04-13 NOTE — Telephone Encounter (Signed)
Patient returned your call.  States if calling in regards to appointment scheduled on 06/11/20, she is aware of it.  If not, can be reached at number in chart.

## 2020-06-11 ENCOUNTER — Other Ambulatory Visit (INDEPENDENT_AMBULATORY_CARE_PROVIDER_SITE_OTHER): Payer: BC Managed Care – PPO

## 2020-06-11 ENCOUNTER — Other Ambulatory Visit: Payer: Self-pay

## 2020-06-11 ENCOUNTER — Encounter: Payer: Self-pay | Admitting: Gastroenterology

## 2020-06-11 ENCOUNTER — Ambulatory Visit (INDEPENDENT_AMBULATORY_CARE_PROVIDER_SITE_OTHER): Payer: BC Managed Care – PPO | Admitting: Gastroenterology

## 2020-06-11 VITALS — BP 110/74 | HR 68 | Ht 66.5 in | Wt 225.2 lb

## 2020-06-11 DIAGNOSIS — R7989 Other specified abnormal findings of blood chemistry: Secondary | ICD-10-CM

## 2020-06-11 LAB — HEPATIC FUNCTION PANEL
ALT: 34 U/L (ref 0–35)
AST: 25 U/L (ref 0–37)
Albumin: 3.9 g/dL (ref 3.5–5.2)
Alkaline Phosphatase: 153 U/L — ABNORMAL HIGH (ref 39–117)
Bilirubin, Direct: 0.1 mg/dL (ref 0.0–0.3)
Total Bilirubin: 0.5 mg/dL (ref 0.2–1.2)
Total Protein: 7.5 g/dL (ref 6.0–8.3)

## 2020-06-11 NOTE — Progress Notes (Signed)
Review of pertinent gastrointestinal problems: 1.Incidental pancreatic cyst:cystic lesion pancreatic head noted 09/2011; EUS 4.1cm multiloculated without concerning mophrology; Cytology negative CEA less than 0.2ng/mL Amylase 42 U/L;  EUS 09/2011 68mm max MRI 01/2013: 44mm by 29mm;  MRI 07/2013 30mm by 61mm.  CT 11/2014: 68mm by 73mm by 41mm.  CT 12/2015: 67mm by 36mm by 56mm. MRI 03/2016 49 mm x 35 mm x 41 mm. Whipple surgery 2018.  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  Complicated by postoperative chronic elevated liver tests, alk phos and potential secondary sclerosing cirrhosis.  Eventual ERCP January 2021, June 2021 and then November 2021 all with Dr. Rush Landmark;    HPI: This is a very pleasant 50 year old woman whom I last saw 1 or 2 years ago.  Her care has been really dominated by 3 ERCPs in the past year or so to relieve her chronic biliary obstruction.  I think a lot of this was from a biliary stent that was left in place following her Whipple procedure.  Blood work December 2021 alk phos 236, remaining liver tests all normal, this was the lowest these levels have all been in about 2 years.  She has had no jaundiced and importantly no pruritus in 2 months since her last ERCP.  She feels overall very well.   ROS: complete GI ROS as described in HPI, all other review negative.  Constitutional:  No unintentional weight loss   Past Medical History:  Diagnosis Date  . Anemia 01-02-12   mostly low iron- tx. oral meds, not in several yrs  . Chronic kidney disease 01-02-12   dx. rt. kidney neoplasm-surgery planned, past hx. protein in urine.  . 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 Depression   . Family history of anesthesia complication    mother slow to wake up   . Leg swelling 01-02-12   occ. episodes of pitting edema lower extremities at end of day.  . Preeclampsia  1998   Resolved  . Pregnancy induced hypertension 1998   Resolved  . Sleep apnea    "dx'd; used mask for ~ 1 yr; lost weight; no longer wear mask" (12/21/2016)  . Thyroid enlargement 01/02/2012   left thyroid enlargement, being watched, S/P [place]; no tx.    Past Surgical History:  Procedure Laterality Date  . BILIARY DILATION  05/05/2019   Procedure: BILIARY DILATION;  Surgeon: Mansouraty, Telford Nab., MD;  Location: [place];  Service: Gastroenterology;;  . BILIARY DILATION  10/20/2019   Procedure: BILIARY DILATION;  Surgeon: Irving Copas., MD;  Location: [place];  Service: Gastroenterology;;  . BILIARY DILATION  03/22/2020   Procedure: BILIARY DILATION;  Surgeon: Irving Copas., MD;  Location: [place];  Service: Gastroenterology;;  . BILIARY STENT PLACEMENT  05/05/2019   Procedure: BILIARY STENT PLACEMENT;  Surgeon: Irving Copas., MD;  Location: [place];  Service: Gastroenterology;;  . BILIARY STENT PLACEMENT  10/20/2019   Procedure: BILIARY STENT PLACEMENT;  Surgeon: Irving Copas., MD;  Location: [place];  Service: Gastroenterology;;  . Fidel Levy H PYLORI  09/20/2011   Procedure: BREATH TEK H PYLORI;  Surgeon: Shann Medal, MD;  [facility]: [facility];  Service: General;  Laterality: N/A;  . childbirth  01-02-12   x2 NVD- pre-exclampsia with pregrancy  . CHOLECYSTECTOMY  11/23/2016   'w/whipple"  . ENDOSCOPIC RETROGRADE CHOLANGIOPANCREATOGRAPHY (ERCP) WITH PROPOFOL N/A 05/05/2019   Procedure:  ENDOSCOPIC RETROGRADE CHOLANGIOPANCREATOGRAPHY (ERCP) WITH PROPOFOL;  Surgeon: Rush Landmark Telford Nab., MD;  Location: [place];  Service: Gastroenterology;  Laterality: N/A;  . ENDOSCOPIC RETROGRADE CHOLANGIOPANCREATOGRAPHY (ERCP) WITH PROPOFOL N/A 10/20/2019   Procedure: ENDOSCOPIC RETROGRADE CHOLANGIOPANCREATOGRAPHY (ERCP) WITH PROPOFOL;  Surgeon: Rush Landmark Telford Nab., MD;  Location: [place];  Service: Gastroenterology;   Laterality: N/A;  . ENDOSCOPIC RETROGRADE CHOLANGIOPANCREATOGRAPHY (ERCP) WITH PROPOFOL N/A 03/22/2020   Procedure: ENDOSCOPIC RETROGRADE CHOLANGIOPANCREATOGRAPHY (ERCP) WITH PROPOFOL;  Surgeon: Rush Landmark Telford Nab., MD;  Location: [place];  Service: Gastroenterology;  Laterality: N/A;  . ESOPHAGOGASTRODUODENOSCOPY (EGD) WITH PROPOFOL N/A 05/05/2019   Procedure: ESOPHAGOGASTRODUODENOSCOPY (EGD) WITH PROPOFOL;  Surgeon: Rush Landmark Telford Nab., MD;  Location: [place];  Service: Gastroenterology;  Laterality: N/A;  . EUS  12/07/2011   Procedure: UPPER ENDOSCOPIC ULTRASOUND (EUS) LINEAR;  Surgeon: Milus Banister, MD;  [facility];  Service: Endoscopy;  Laterality: N/A;  radial linear  . IR RADIOLOGIST EVAL & MGMT  01/11/2017  . LIVER BIOPSY  12/2018  . REMOVAL OF STONES  05/05/2019   Procedure: REMOVAL OF STONES;  Surgeon: Rush Landmark Telford Nab., MD;  Location: [place];  Service: Gastroenterology;;  . REMOVAL OF STONES  10/20/2019   Procedure: REMOVAL OF STONES;  Surgeon: Irving Copas., MD;  Location: [place];  Service: Gastroenterology;;  . REMOVAL OF STONES  03/22/2020   Procedure: REMOVAL OF STONES;  Surgeon: Irving Copas., MD;  Location: [place];  Service: Gastroenterology;;  . ROBOTIC ASSITED PARTIAL NEPHRECTOMY Left 10/02/2013   Procedure: ROBOTIC ASSITED PARTIAL NEPHRECTOMY;  Surgeon: Raynelle Bring, MD;  Location[facility];  Service: Urology;  Laterality: Left;  . ROBOTIC ASSITED PARTIAL NEPHRECTOMY Right 12/2011  . STENT REMOVAL  10/20/2019   Procedure: STENT REMOVAL;  Surgeon: Irving Copas., MD;  Location: [place];  Service: Gastroenterology;;  . Lavell Islam REMOVAL  03/22/2020   Procedure: STENT REMOVAL;  Surgeon: Irving Copas., MD;  Location: [place];  Service: Gastroenterology;;  . [place] INJECTION  05/05/2019   Procedure: SUBMUCOSAL TATTOO INJECTION;  Surgeon: Irving Copas., MD;  Location: [facility];  Service: Gastroenterology;;  . VAGINAL HYSTERECTOMY  2008  . WHIPPLE PROCEDURE N/A 11/23/2016   Procedure: WHIPPLE PROCEDURE;  Surgeon: Stark Klein, MD;  Location: [place];  Service: General;  Laterality: N/A;    Current Outpatient Medications  Medication Sig Dispense Refill  . acetaminophen (TYLENOL) 500 MG tablet Take 1,000 mg by mouth every 6 (six) hours as needed for moderate pain or headache.    . citalopram (CELEXA) 20 MG tablet Take 20 mg by mouth daily with breakfast.      No current facility-administered medications for this visit.    Allergies as of 06/11/2020  . (No Known Allergies)    Family History  Problem Relation Age of Onset  . Cancer Paternal Uncle        lung  . AAA (abdominal aortic aneurysm) Maternal Grandfather        60s-70s  . Colon cancer Neg Hx   . Esophageal cancer Neg Hx   . Inflammatory bowel disease Neg Hx   . Liver disease Neg Hx   . Pancreatic cancer Neg Hx   . Rectal cancer Neg Hx   . Stomach cancer Neg Hx     Social History   Socioeconomic History  . Marital status: Married    Spouse name: Not on file  . Number of children: 2  . Years of education: Not on file  . Highest education level:  Not on file  Occupational History  . Occupation: Product manager: [employer]  Tobacco Use  . Smoking status: Never Smoker  . Smokeless tobacco: Never Used  Vaping Use  . Vaping Use: Never used  Substance and Sexual Activity  . Alcohol use: Yes    Comment:  "beer twice/month; if that"  . Drug use: No  . Sexual activity: Not Currently    Birth control/protection: Surgical    Comment: Hysterectomy  Other Topics Concern  . Not on file  Social History Narrative  . Not on file   Social Determinants of Health   Financial Resource Strain: Not on file  Food Insecurity: Not on file  Transportation Needs: Not on file  Physical Activity: Not on file  Stress: Not on file  Social Connections: Not on file  Intimate  Partner Violence: Not on file     Physical Exam: BP 110/74 (BP Location: Left Arm, Patient Position: Sitting, Cuff Size: Normal)   Pulse 68   Ht 5' 6.5" (1.689 m) Comment: height measured without shoes  Wt 225 lb 4 oz (102.2 kg)   LMP  (LMP Unknown)   BMI 35.81 kg/m  Constitutional: generally well-appearing Psychiatric: alert and oriented x3 Abdomen: soft, nontender, nondistended, no obvious ascites, no peritoneal signs, normal bowel sounds No peripheral edema noted in lower extremities  Assessment and plan: 50 y.o. female with previous chronic biliary obstruction, low-level cholangitis following Whipple procedure  Clinically she is quite a bit improved since her last ERCP November 2021.  Her pruritus is completely gone.  Her LFTs are the best they have been in at least a year.  She will get a repeat set of LFTs today and pending the results she will probably be fine for surveillance LFTs every 6 months or so unless new symptoms such as pruritus of the eyes.  Please see the "Patient Instructions" section for addition details about the plan.  Owens Loffler, MD [facility] 06/11/2020, 10:15 AM   Total time on date of encounter was 30 minutes (this included time spent preparing to see the patient reviewing records; obtaining and/or reviewing separately obtained history; performing a medically appropriate exam and/or evaluation; counseling and educating the patient and family if present; ordering medications, tests or procedures if applicable; and documenting clinical information in the health record).

## 2020-06-11 NOTE — Patient Instructions (Signed)
If you are age 49 or older, your body mass index should be between 23-30. Your Body mass index is 35.81 kg/m. If this is out of the aforementioned range listed, please consider follow up with your Primary Care Provider.  If you are age 79 or younger, your body mass index should be between 19-25. Your Body mass index is 35.81 kg/m. If this is out of the aformentioned range listed, please consider follow up with your Primary Care Provider.   LABS:   Labwork has been ordered for you. Please have these done today.  Press "B" on the elevator. The lab is located at the first door on the left as you exit the elevator.  HEALTHCARE LAWS AND MY CHART RESULTS: Due to recent changes in healthcare laws, you may see the results of your imaging and laboratory studies on MyChart before your provider has had a chance to review them.   We understand that in some cases there may be results that are confusing or concerning to you. Not all laboratory results come back in the same time frame and the provider may be waiting for multiple results in order to interpret others.  Please give [place] 48 hours in order for your provider to thoroughly review all the results before contacting the office for clarification of your results.   It was great seeing you today!  Thank you for entrusting me with your care and choosing [facility].  Dr. Ardis Hughs

## 2020-06-24 ENCOUNTER — Other Ambulatory Visit (HOSPITAL_COMMUNITY): Payer: Self-pay | Admitting: Urology

## 2020-06-24 ENCOUNTER — Other Ambulatory Visit: Payer: Self-pay | Admitting: Urology

## 2020-06-24 DIAGNOSIS — Z85528 Personal history of other malignant neoplasm of kidney: Secondary | ICD-10-CM

## 2020-06-24 DIAGNOSIS — D49511 Neoplasm of unspecified behavior of right kidney: Secondary | ICD-10-CM

## 2020-09-09 ENCOUNTER — Ambulatory Visit (HOSPITAL_COMMUNITY)
Admission: RE | Admit: 2020-09-09 | Discharge: 2020-09-09 | Disposition: A | Discharge status: Home / Self Care | Payer: BC Managed Care – PPO | Source: Ambulatory Visit | Attending: Urology | Admitting: Urology

## 2020-09-09 ENCOUNTER — Other Ambulatory Visit: Payer: Self-pay

## 2020-09-09 DIAGNOSIS — D49511 Neoplasm of unspecified behavior of right kidney: Secondary | ICD-10-CM | POA: Insufficient documentation

## 2020-09-09 DIAGNOSIS — Z85528 Personal history of other malignant neoplasm of kidney: Secondary | ICD-10-CM | POA: Insufficient documentation

## 2020-09-09 MED ORDER — GADOBUTROL 1 MMOL/ML IV SOLN
10.0000 mL | Freq: Once | INTRAVENOUS | Status: AC | PRN
  Administered 2020-09-09: 10 mL via INTRAVENOUS

## 2020-09-14 ENCOUNTER — Other Ambulatory Visit: Payer: Self-pay | Admitting: *Deleted

## 2020-09-14 ENCOUNTER — Other Ambulatory Visit (INDEPENDENT_AMBULATORY_CARE_PROVIDER_SITE_OTHER): Payer: BC Managed Care – PPO

## 2020-09-14 DIAGNOSIS — R7989 Other specified abnormal findings of blood chemistry: Secondary | ICD-10-CM | POA: Diagnosis not present

## 2020-09-14 LAB — HEPATIC FUNCTION PANEL
ALT: 37 U/L — ABNORMAL HIGH (ref 0–35)
AST: 34 U/L (ref 0–37)
Albumin: 4.2 g/dL (ref 3.5–5.2)
Alkaline Phosphatase: 159 U/L — ABNORMAL HIGH (ref 39–117)
Bilirubin, Direct: 0.1 mg/dL (ref 0.0–0.3)
Total Bilirubin: 0.6 mg/dL (ref 0.2–1.2)
Total Protein: 7.6 g/dL (ref 6.0–8.3)

## 2020-09-15 ENCOUNTER — Other Ambulatory Visit: Payer: Self-pay | Admitting: Urology

## 2020-09-15 DIAGNOSIS — N2889 Other specified disorders of kidney and ureter: Secondary | ICD-10-CM

## 2020-09-16 ENCOUNTER — Other Ambulatory Visit: Payer: Self-pay

## 2020-09-16 DIAGNOSIS — R7989 Other specified abnormal findings of blood chemistry: Secondary | ICD-10-CM

## 2020-09-30 ENCOUNTER — Encounter: Payer: Self-pay | Admitting: *Deleted

## 2020-09-30 ENCOUNTER — Ambulatory Visit
Admission: RE | Admit: 2020-09-30 | Discharge: 2020-09-30 | Disposition: A | Discharge status: Home / Self Care | Payer: BC Managed Care – PPO | Source: Ambulatory Visit | Attending: Urology | Admitting: Urology

## 2020-09-30 ENCOUNTER — Other Ambulatory Visit: Payer: Self-pay

## 2020-09-30 DIAGNOSIS — N2889 Other specified disorders of kidney and ureter: Secondary | ICD-10-CM

## 2020-09-30 HISTORY — PX: IR RADIOLOGIST EVAL & MGMT: IMG5224

## 2020-09-30 NOTE — Consult Note (Signed)
Chief Complaint: Right sided renal lesion c/w RCC  Referring Physician(s): Borden,Lester  History of Present Illness: Krystal Reynolds is a 50 y.o. female with past medical history significant for sleep apnea, hypertension, previous Whipple for benign lesion, and bilateral renal cell carcinomas, post bilateral partial nephrectomies (right robotic partial nephrectomy on 01/08/2012; left robotic partial nephrectomy on 10/02/2013 both with pathology c/w grade II-III; clear cell RCC) who has been referred for evaluation potential candidacy for percutaneous cryoablation of new right-sided renal lesion.  Note, VHL genetic testing has been previously performed and found to be negative.   Patient has been evaluated by Dr. Alinda Money and deemed a poor operative candidate given history of previous bilateral partial nephrectomies as well as a Whipple.  Patient remains asymptomatic in regards to this incidentally discovered right-sided renal lesion.  Specifically, no flank pain or hematuria.  No unintentional weight loss or weight gain.  Past Medical History:  Diagnosis Date  . Anemia 01-02-12   mostly low iron- tx. oral meds, not in several yrs  . Chronic kidney disease 01-02-12   dx. rt. kidney neoplasm-surgery planned, past hx. protein in urine.  . 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 Depression   . Family history of anesthesia complication    mother slow to wake up   . Leg swelling 01-02-12   occ. episodes of pitting edema lower extremities at end of day.  . Preeclampsia 1998   Resolved  . Pregnancy induced hypertension 1998   Resolved  . Sleep apnea    "dx'd; used mask for ~ 1 yr; lost weight; no longer wear mask" (12/21/2016)  . Thyroid enlargement 01/02/2012   left thyroid enlargement, being watched, S/P [place]; no tx.    Past Surgical History:  Procedure Laterality Date  . BILIARY DILATION  05/05/2019    Procedure: BILIARY DILATION;  Surgeon: Mansouraty, Telford Nab., MD;  Location: [facility];  Service: Gastroenterology;;  . BILIARY DILATION  10/20/2019   Procedure: BILIARY DILATION;  Surgeon: Irving Copas., MD;  Location: [place];  Service: Gastroenterology;;  . BILIARY DILATION  03/22/2020   Procedure: BILIARY DILATION;  Surgeon: Irving Copas., MD;  Location: [place];  Service: Gastroenterology;;  . BILIARY STENT PLACEMENT  05/05/2019   Procedure: BILIARY STENT PLACEMENT;  Surgeon: Irving Copas., MD;  Location: [facility];  Service: Gastroenterology;;  . BILIARY STENT PLACEMENT  10/20/2019   Procedure: BILIARY STENT PLACEMENT;  Surgeon: Irving Copas., MD;  Location: [place] [place];  Service: Gastroenterology;;  . Fidel Levy H PYLORI  09/20/2011   Procedure: BREATH TEK H PYLORI;  Surgeon: Shann Medal, MD;  Location: [facility];  Service: General;  Laterality: N/A;  . childbirth  01-02-12   x2 NVD- pre-exclampsia with pregrancy  . CHOLECYSTECTOMY  11/23/2016   'w/whipple"  . ENDOSCOPIC RETROGRADE CHOLANGIOPANCREATOGRAPHY (ERCP) WITH PROPOFOL N/A 05/05/2019   Procedure: ENDOSCOPIC RETROGRADE CHOLANGIOPANCREATOGRAPHY (ERCP) WITH PROPOFOL;  Surgeon: Rush Landmark Telford Nab., MD;  Location: [facility];  Service: Gastroenterology;  Laterality: N/A;  . ENDOSCOPIC RETROGRADE CHOLANGIOPANCREATOGRAPHY (ERCP) WITH PROPOFOL N/A 10/20/2019   Procedure: ENDOSCOPIC RETROGRADE CHOLANGIOPANCREATOGRAPHY (ERCP) WITH PROPOFOL;  Surgeon: Rush Landmark Telford Nab., MD;  Location: [facility] [place];  Service: Gastroenterology;  Laterality: N/A;  . ENDOSCOPIC RETROGRADE CHOLANGIOPANCREATOGRAPHY (ERCP) WITH PROPOFOL N/A 03/22/2020   Procedure: ENDOSCOPIC RETROGRADE CHOLANGIOPANCREATOGRAPHY (ERCP) WITH PROPOFOL;  Surgeon: Rush Landmark Telford Nab., MD;  Location: [facility];  Service: Gastroenterology;  Laterality:  N/A;  . ESOPHAGOGASTRODUODENOSCOPY (EGD) WITH PROPOFOL N/A  05/05/2019   Procedure: ESOPHAGOGASTRODUODENOSCOPY (EGD) WITH PROPOFOL;  Surgeon: Rush Landmark Telford Nab., MD;  Location: [place] [facility];  Service: Gastroenterology;  Laterality: N/A;  . EUS  12/07/2011   Procedure: UPPER ENDOSCOPIC ULTRASOUND (EUS) LINEAR;  Surgeon: Milus Banister, MD;  Location: [facility];  Service: Endoscopy;  Laterality: N/A;  radial linear  . IR RADIOLOGIST EVAL & MGMT  01/11/2017  . LIVER BIOPSY  12/2018  . REMOVAL OF STONES  05/05/2019   Procedure: REMOVAL OF STONES;  Surgeon: Rush Landmark Telford Nab., MD;  Location: [facility];  Service: Gastroenterology;;  . REMOVAL OF STONES  10/20/2019   Procedure: REMOVAL OF STONES;  Surgeon: Irving Copas., MD;  Location: [facility] [facility];  Service: Gastroenterology;;  . REMOVAL OF STONES  03/22/2020   Procedure: REMOVAL OF STONES;  Surgeon: Irving Copas., MD;  Location: [place];  Service: Gastroenterology;;  . ROBOTIC ASSITED PARTIAL NEPHRECTOMY Left 10/02/2013   Procedure: ROBOTIC ASSITED PARTIAL NEPHRECTOMY;  Surgeon: Raynelle Bring, MD;  Location: [facility];  Service: Urology;  Laterality: Left;  . ROBOTIC ASSITED PARTIAL NEPHRECTOMY Right 12/2011  . STENT REMOVAL  10/20/2019   Procedure: STENT REMOVAL;  Surgeon: Irving Copas., MD;  Location: [facility];  Service: Gastroenterology;;  . Lavell Islam REMOVAL  03/22/2020   Procedure: STENT REMOVAL;  Surgeon: Irving Copas., MD;  Location: [facility];  Service: Gastroenterology;;  . [place] INJECTION  05/05/2019   Procedure: SUBMUCOSAL TATTOO INJECTION;  Surgeon: Irving Copas., MD;  Location: [facility];  Service: Gastroenterology;;  . VAGINAL HYSTERECTOMY  2008  . WHIPPLE PROCEDURE N/A 11/23/2016   Procedure: WHIPPLE PROCEDURE;  Surgeon: Stark Klein, MD;  Location: [facility];  Service: General;  Laterality: N/A;    Allergies: Patient has no known allergies.  Medications: Prior to Admission medications   Medication Sig Start Date  End Date Taking? Authorizing Provider  acetaminophen (TYLENOL) 500 MG tablet Take 1,000 mg by mouth every 6 (six) hours as needed for moderate pain or headache.    [provider]  citalopram (CELEXA) 20 MG tablet Take 20 mg by mouth daily with breakfast.     [provider]     Family History  Problem Relation Age of Onset  . Cancer Paternal Uncle        lung  . AAA (abdominal aortic aneurysm) Maternal Grandfather        60s-70s  . Colon cancer Neg Hx   . Esophageal cancer Neg Hx   . Inflammatory bowel disease Neg Hx   . Liver disease Neg Hx   . Pancreatic cancer Neg Hx   . Rectal cancer Neg Hx   . Stomach cancer Neg Hx     Social History   Socioeconomic History  . Marital status: Married    Spouse name: Not on file  . Number of children: 2  . Years of education: Not on file  . Highest education level: Not on file  Occupational History  . Occupation: Product manager: [employer]  Tobacco Use  . Smoking status: Never Smoker  . Smokeless tobacco: Never Used  Vaping Use  . Vaping Use: Never used  Substance and Sexual Activity  . Alcohol use: Yes    Comment:  "beer twice/month; if that"  . Drug use: No  . Sexual activity: Not Currently    Birth control/protection: Surgical    Comment: Hysterectomy  Other Topics Concern  . Not on  file  Social History Narrative  . Not on file   Social Determinants of Health   Financial Resource Strain: Not on file  Food Insecurity: Not on file  Transportation Needs: Not on file  Physical Activity: Not on file  Stress: Not on file  Social Connections: Not on file    ECOG Status: 0 - Asymptomatic  Review of Systems  Review of Systems: A 12 point ROS discussed and pertinent positives are indicated in the HPI above.  All other systems are negative.  Physical Exam No direct physical exam was performed (except for noted visual exam findings with Video Visits).   Vital Signs: LMP  (LMP  Unknown)   Imaging:  Following examinations were reviewed in detail:  Abdominal MRI-09/09/2020; 11/11/2018 CT abdomen and pelvis-01/26/2019  Review of abdominal MRI performed 09/09/2020 it demonstrates an approximately 1.5 x 1.3 cm enhancing mass posterior medial interpolar aspect of the right kidney (image 53, series 16) increased in size in hindsight compared to abdominal MRI performed 11/11/2018, previously, 0.8 cm in diameter.  The right renal vein appears widely patent.  No evidence of regional metastatic disease.  MR ABDOMEN WWO CONTRAST  Result Date: 09/10/2020 CLINICAL DATA:  Follow-up partial right nephrectomy, prior Whipple EXAM: MRI ABDOMEN WITHOUT AND WITH CONTRAST TECHNIQUE: Multiplanar multisequence MR imaging of the abdomen was performed both before and after the administration of intravenous contrast. CONTRAST:  35mL GADAVIST GADOBUTROL 1 MMOL/ML IV SOLN COMPARISON:  CT abdomen pelvis, 06/14/2020, 01/06/2019 FINDINGS: Lower chest: No acute findings. Hepatobiliary: Mild hepatic steatosis. No mass or other parenchymal abnormality identified. Status post cholecystectomy and hepatojejunostomy. Pancreas: Status post Whipple pancreaticoduodenectomy. No mass, inflammatory changes, or other parenchymal abnormality identified. Spleen:  Within normal limits in size and appearance. Adrenals/Urinary Tract: Redemonstrated small contrast enhancing mass of the posteromedial right kidney measuring 1.5 x 1.3 cm (series 16, image 53). Status post partial nephrectomy of the inferior pole of the right kidney and anterior midportion of the left kidney. No evidence of hydronephrosis. Stomach/Bowel: Status post distal gastrectomy and gastrojejunostomy. Vascular/Lymphatic: No pathologically enlarged lymph nodes identified. No abdominal aortic aneurysm demonstrated. Other:  None. Musculoskeletal: No suspicious bone lesions identified. IMPRESSION: 1. Redemonstrated small contrast enhancing mass of the posteromedial  right kidney measuring 1.5 cm, consistent with renal cell carcinoma. No evidence of renal vein invasion, lymphadenopathy, or metastatic disease in the abdomen. 2. Status post partial nephrectomy of the inferior pole of the right kidney and anterior midportion of the left kidney. No evidence of local recurrence suspicious contrast enhancement about these resection sites. 3. Mild hepatic steatosis. 4. Status post Whipple pancreaticoduodenectomy. Electronically Signed   By: Eddie Candle M.D.   On: 09/10/2020 09:18    Labs:  CBC: Recent Labs    10/20/19 0839 01/23/20 1439  WBC  --  7.5  HGB 11.6* 11.5*  HCT 34.0* 34.5*  PLT  --  314.0    COAGS: Recent Labs    01/23/20 1439  INR 1.0    BMP: Recent Labs    10/20/19 0839  NA 138  K 3.9  CL 104  GLUCOSE 124*  BUN 8  CREATININE 0.80    LIVER FUNCTION TESTS: Recent Labs    10/20/19 0809 04/06/20 1647 06/11/20 1025 09/14/20 0917  BILITOT 0.9 0.5 0.5 0.6  AST 69* 32 25 34  ALT 62* 34 34 37*  ALKPHOS 524* 236* 153* 159*  PROT 7.2 7.2 7.5 7.6  ALBUMIN 3.1* 3.8 3.9 4.2    TUMOR MARKERS: No results  for input(s): AFPTM, CEA, CA199, CHROMGRNA in the last 8760 hours.  Assessment and Plan:  ATIYANA WELTE is a 50 y.o. female with past medical history significant for sleep apnea, hypertension, previous Whipple for benign lesion, and bilateral renal cell carcinomas, post bilateral partial nephrectomies (right robotic partial nephrectomy on 01/08/2012; left robotic partial nephrectomy on 10/02/2013 both with pathology c/w grade II-III; clear cell RCC) who has been referred for evaluation potential candidacy for percutaneous cryoablation of new right-sided renal lesion.  Note, VHL genetic testing has been previously performed and found to be negative.   Patient remains asymptomatic in regards to this incidentally discovered right-sided renal lesion.    Following examinations were reviewed in detail:  Abdominal MRI-09/09/2020;  11/11/2018 CT abdomen and pelvis- 06/14/2020; 01/26/2019  Review of abdominal MRI performed 09/09/2020 it demonstrates an approximately 1.5 x 1.3 cm enhancing mass posterior medial interpolar aspect of the right kidney (image 53, series 16), increased in size in hindsight compared to abdominal MRI performed 11/11/2018, previously, 0.8 cm in diameter.  The right renal vein appears widely patent.  No evidence of regional metastatic disease.  I explained that the right-sided renal lesion demonstrates imaging characteristics compatible with an RCC and therefore does not warrant percutaneous biopsy.  Given the patient's extensive postoperative history (including previous Whipple) as well as the size (less than 3 cm) and location (posterior medial) of this solitary right sided renal lesion, I feel the patient is a excellent candidate for percutaneous cryoablation.    As such, prolonged conversations were held with the patient regarding the benefits and risks of image guided renal cryoablation was discussed with the patient including, but not limited to, failure to treat entire lesion, bleeding, infection, damage to adjacent structures, hematuria, urine leak, decrease in renal function or post procedural neuropathy.  I explained that while I would attempt to perform a biopsy at the time of the cryoablation, given the small size of the lesion this may prove challenging/nondiagnostic.    I also explained that as surgical margins are not obtained at the time of the cryoablation, we will perform preprocedural surveillance for the 3 years following the procedure to document imaging stability of cure.  All of the patient's questions were answered and the patient is agreeable to proceed.  As such, we will proceed with scheduling the right-sided renal cryoablation with general anesthesia at [facility].  The procedure will entail an overnight admission for continued observation and potential PCA usage.  The  patient knows to call the interventional radiology clinic with any interval questions or concerns.  Plan: - Schedule right-sided renal cryoablation with possible biopsy at [facility] with general anesthesia.  The procedure will entail an overnight admission for continued observation and PCA usage.   Thank you for this interesting consult.  I greatly enjoyed meeting Krystal Reynolds and look forward to participating in their care.  A copy of this report was sent to the requesting provider on this date.  Electronically Signed: Sandi Mariscal 09/30/2020, 12:00 PM   I spent a total of 15 Minutes in remote  clinical consultation, greater than 50% of which was counseling/coordinating care for right sided renal cell carcinoma.    Visit type: Audio only (telephone). Audio (no video) only due to patient's lack of internet/smartphone capability. Alternative for in-person consultation at [facility], [address] [place], [place], [place]. This visit type was conducted due to national recommendations for restrictions regarding the COVID-19 Pandemic (e.g. social distancing).  This format  is felt to be most appropriate for this patient at this time.  All issues noted in this document were discussed and addressed.

## 2020-10-04 ENCOUNTER — Other Ambulatory Visit (HOSPITAL_COMMUNITY): Payer: Self-pay | Admitting: Interventional Radiology

## 2020-10-04 DIAGNOSIS — C641 Malignant neoplasm of right kidney, except renal pelvis: Secondary | ICD-10-CM

## 2020-10-15 NOTE — Patient Instructions (Addendum)
DUE TO COVID-19 ONLY ONE VISITOR IS ALLOWED TO COME WITH YOU AND STAY IN THE WAITING ROOM ONLY DURING PRE OP AND PROCEDURE DAY OF SURGERY. THE 2 VISITORS  MAY VISIT WITH YOU AFTER SURGERY IN YOUR PRIVATE ROOM DURING VISITING HOURS ONLY!  YOU NEED TO HAVE A COVID 19 TEST ON___6/20____ @___2 :45____, THIS TEST MUST BE DONE BEFORE SURGERY,  COVID TESTING SITE [place] [address], IT IS ON THE RIGHT GOING OUT [address] APPROXIMATELY  2 MINUTES PAST [employer] ON THE RIGHT. ONCE YOUR COVID TEST IS COMPLETED,  PLEASE BEGIN THE QUARANTINE INSTRUCTIONS AS OUTLINED IN YOUR HANDOUT.                Krystal Reynolds     Your procedure is scheduled on: 10/20/20   Report to [facility] Main  Entrance   Report to admitting at  6:00 AM     Call this number if you have problems the morning of surgery [phone]    Remember: Do not eat food after Midnight.  You may have clear liquids until 4:00 am.  BRUSH YOUR TEETH MORNING OF SURGERY AND RINSE YOUR MOUTH OUT, NO CHEWING GUM CANDY OR MINTS.     Take these medicines the morning of surgery with A SIP OF WATER: Citalopram                                You may not have any metal on your body including hair pins and              piercings  Do not wear jewelry, make-up, lotions, powders or perfumes, deodorant             Do not wear nail polish on your fingernails.  Do not shave  48 hours prior to surgery.              Do not bring valuables to the hospital. [place].  Contacts, dentures or bridgework may not be worn into surgery.       Special Instructions: N/A              Please read over the following fact sheets you were given: _____________________________________________________________________             [facility] - Preparing for Surgery Before surgery, you can play an important role.  Because skin is not sterile, your skin needs to be as  free of germs as possible.  You can reduce the number of germs on your skin by washing with CHG (chlorahexidine gluconate) soap before surgery.  CHG is an antiseptic cleaner which kills germs and bonds with the skin to continue killing germs even after washing. Please DO NOT use if you have an allergy to CHG or antibacterial soaps.  If your skin becomes reddened/irritated stop using the CHG and inform your nurse when you arrive at Short Stay. Do not shave (including legs and underarms) for at least 48 hours prior to the first CHG shower.    Please follow these instructions carefully:  1.  Shower with CHG Soap the night before surgery and the  morning of Surgery.  2.  If you choose to wash your hair, wash your hair first as usual with your  normal  shampoo.  3.  After  you shampoo, rinse your hair and body thoroughly to remove the  shampoo.                                        4.  Use CHG as you would any other liquid soap.  You can apply chg directly  to the skin and wash                       Gently with a scrungie or clean washcloth.  5.  Apply the CHG Soap to your body ONLY FROM THE NECK DOWN.   Do not use on face/ open                           Wound or open sores. Avoid contact with eyes, ears mouth and genitals (private parts).                       Wash face,  Genitals (private parts) with your normal soap.             6.  Wash thoroughly, paying special attention to the area where your surgery  will be performed.  7.  Thoroughly rinse your body with warm water from the neck down.  8.  DO NOT shower/wash with your normal soap after using and rinsing off  the CHG Soap.             9.  Pat yourself dry with a clean towel.            10.  Wear clean pajamas.            11.  Place clean sheets on your bed the night of your first shower and do not  sleep with pets. Day of Surgery : Do not apply any lotions/deodorants the morning of surgery.  Please wear clean clothes to the hospital/surgery  center.  FAILURE TO FOLLOW THESE INSTRUCTIONS MAY RESULT IN THE CANCELLATION OF YOUR SURGERY PATIENT SIGNATURE_________________________________  NURSE SIGNATURE__________________________________  ________________________________________________________________________

## 2020-10-18 ENCOUNTER — Encounter (HOSPITAL_COMMUNITY)
Admission: RE | Admit: 2020-10-18 | Discharge: 2020-10-18 | Disposition: A | Discharge status: Home / Self Care | Payer: BC Managed Care – PPO | Source: Ambulatory Visit | Attending: Interventional Radiology | Admitting: Interventional Radiology

## 2020-10-18 ENCOUNTER — Encounter (HOSPITAL_COMMUNITY): Payer: Self-pay

## 2020-10-18 ENCOUNTER — Other Ambulatory Visit (HOSPITAL_COMMUNITY)
Admission: RE | Admit: 2020-10-18 | Discharge: 2020-10-18 | Disposition: A | Discharge status: Home / Self Care | Payer: BC Managed Care – PPO | Source: Ambulatory Visit | Attending: Interventional Radiology | Admitting: Interventional Radiology

## 2020-10-18 ENCOUNTER — Other Ambulatory Visit: Payer: Self-pay

## 2020-10-18 DIAGNOSIS — Z01812 Encounter for preprocedural laboratory examination: Secondary | ICD-10-CM | POA: Insufficient documentation

## 2020-10-18 DIAGNOSIS — Z20822 Contact with and (suspected) exposure to covid-19: Secondary | ICD-10-CM | POA: Insufficient documentation

## 2020-10-18 LAB — SARS CORONAVIRUS 2 (TAT 6-24 HRS): SARS Coronavirus 2: NEGATIVE

## 2020-10-18 LAB — CBC
HCT: 39.3 % (ref 36.0–46.0)
Hemoglobin: 12.8 g/dL (ref 12.0–15.0)
MCH: 27.6 pg (ref 26.0–34.0)
MCHC: 32.6 g/dL (ref 30.0–36.0)
MCV: 84.9 fL (ref 80.0–100.0)
Platelets: 387 10*3/uL (ref 150–400)
RBC: 4.63 MIL/uL (ref 3.87–5.11)
RDW: 13.9 % (ref 11.5–15.5)
WBC: 7.3 10*3/uL (ref 4.0–10.5)
nRBC: 0 % (ref 0.0–0.2)

## 2020-10-18 LAB — COMPREHENSIVE METABOLIC PANEL
ALT: 38 U/L (ref 0–44)
AST: 43 U/L — ABNORMAL HIGH (ref 15–41)
Albumin: 4.3 g/dL (ref 3.5–5.0)
Alkaline Phosphatase: 173 U/L — ABNORMAL HIGH (ref 38–126)
Anion gap: 8 (ref 5–15)
BUN: 15 mg/dL (ref 6–20)
CO2: 26 mmol/L (ref 22–32)
Calcium: 9.2 mg/dL (ref 8.9–10.3)
Chloride: 104 mmol/L (ref 98–111)
Creatinine, Ser: 1.1 mg/dL — ABNORMAL HIGH (ref 0.44–1.00)
GFR, Estimated: >60 mL/min (ref >60)
Glucose, Bld: 143 mg/dL — ABNORMAL HIGH (ref 70–99)
Potassium: 4.4 mmol/L (ref 3.5–5.1)
Sodium: 138 mmol/L (ref 135–145)
Total Bilirubin: 0.8 mg/dL (ref 0.3–1.2)
Total Protein: 7.9 g/dL (ref 6.5–8.1)

## 2020-10-18 NOTE — Progress Notes (Signed)
COVID Vaccine Completed:Yes Date COVID Vaccine completed:12/2019-booster 03/2020 COVID vaccine manufacturer: Pfizer     PCP - Dr. Jim Desanctis. LOV about a year ago Cardiologist - no  Chest x-ray - no EKG - no Stress Test - no ECHO - no Cardiac Cath - no Pacemaker/ICD device last checked:NA  Sleep Study - yes CPAP - no longer uses a mask because of wt loss  Fasting Blood Sugar - NA Checks Blood Sugar _____ times a day  Blood Thinner Instructions:NA Aspirin Instructions: Last Dose:  Anesthesia review:   Patient denies shortness of breath, fever, cough and chest pain at PAT appointment Pt has no SOB with any activities.  Patient verbalized understanding of instructions that were given to them at the PAT appointment. Patient was also instructed that they will need to review over the PAT instructions again at home before surgery. yes

## 2020-10-19 ENCOUNTER — Other Ambulatory Visit: Payer: Self-pay | Admitting: Radiology

## 2020-10-19 NOTE — Anesthesia Preprocedure Evaluation (Addendum)
Anesthesia Evaluation  Patient identified by MRN, date of birth, ID band Patient awake    Reviewed: Allergy & Precautions, NPO status , Patient's Chart, lab work & pertinent test results  History of Anesthesia Complications Negative for: history of anesthetic complications  Airway Mallampati: II  TM Distance: >3 FB Neck ROM: Full    Dental no notable dental hx. (+) Teeth Intact, Dental Advisory Given   Pulmonary sleep apnea ,    Pulmonary exam normal breath sounds clear to auscultation       Cardiovascular hypertension (hx PIH), Normal cardiovascular exam Rhythm:Regular Rate:Normal     Neuro/Psych PSYCHIATRIC DISORDERS Depression negative neurological ROS     GI/Hepatic negative GI ROS, Neg liver ROS,   Endo/Other   Obesity   Renal/GU CRFRenal disease RCC      Musculoskeletal negative musculoskeletal ROS (+)   Abdominal (+) + obese,   Peds  Hematology negative hematology ROS (+)   Anesthesia Other Findings Covid test negative   Reproductive/Obstetrics                            Anesthesia Physical Anesthesia Plan  ASA: 3  Anesthesia Plan: General   Post-op Pain Management:    Induction: Intravenous  PONV Risk Score and Plan: 3 and Treatment may vary due to age or medical condition, Ondansetron, Dexamethasone, Midazolam and Scopolamine patch - Pre-op  Airway Management Planned: Oral ETT  Additional Equipment: None  Intra-op Plan:   Post-operative Plan: Extubation in OR  Informed Consent: I have reviewed the patients History and Physical, chart, labs and discussed the procedure including the risks, benefits and alternatives for the proposed anesthesia with the patient or authorized representative who has indicated his/her understanding and acceptance.     Dental advisory given  Plan Discussed with: CRNA and Anesthesiologist  Anesthesia Plan Comments:         Anesthesia Quick Evaluation

## 2020-10-19 NOTE — H&P (Signed)
Chief Complaint: Patient was seen in consultation today for image guided cryoablation and possible biopsy of a right renal lesion at the request of Raynelle Bring   Referring Physician(s): Raynelle Bring   Supervising Physician: Sandi Mariscal  Patient Status: [facility] - Out-pt  History of Present Illness: Krystal Reynolds is a 50 y.o. female with past medical history significant for sleep apnea, hypertension, previous Whipple for benign lesion, and bilateral renal cell carcinomas, post bilateral partial nephrectomies (right robotic partial nephrectomy on 01/08/2012; left robotic partial nephrectomy on 10/02/2013 both with pathology c/w grade II-III; clear cell RCC) who has been referred for evaluation potential candidacy for percutaneous cryoablation of new right-sided renal lesion.  Patient had a virtual consultation with Dr. Pascal Lux on 09/30/20, she was deemed an appropriate candidate for a cryoablation and possible biopsy of a right renal lesion and presents today for the procedure.    Patient laying in bed, not in acute distress.  Denise headache, fever, chills, shortness of breath, cough, chest pain, abdominal pain, nausea ,vomiting, and bleeding.   Past Medical History:  Diagnosis Date   Chronic kidney disease 01/02/2012   dx. rt. kidney neoplasm-surgery planned, past hx. protein in urine.   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 episodes of pitting edema lower extremities at end of day.   Preeclampsia 1998   Resolved   Pregnancy induced hypertension 1998   Resolved   Sleep apnea    "dx'd; used mask for ~ 1 yr; lost weight; no longer wear mask" (12/21/2016)   Thyroid enlargement 01/02/2012   left thyroid enlargement, being watched, S/P [place]; no tx.    Past Surgical History:  Procedure  Laterality Date   BILIARY DILATION  05/05/2019   Procedure: BILIARY DILATION;  Surgeon: Mansouraty, Telford Nab., MD;  Location: [place];  Service: Gastroenterology;;   BILIARY DILATION  10/20/2019   Procedure: BILIARY DILATION;  Surgeon: Irving Copas., MD;  Location: [place];  Service: Gastroenterology;;   BILIARY DILATION  03/22/2020   Procedure: BILIARY DILATION;  Surgeon: Irving Copas., MD;  Location: [place];  Service: Gastroenterology;;   BILIARY STENT PLACEMENT  05/05/2019   Procedure: BILIARY STENT PLACEMENT;  Surgeon: Irving Copas., MD;  Location: [place];  Service: Gastroenterology;;   BILIARY STENT PLACEMENT  10/20/2019   Procedure: BILIARY STENT PLACEMENT;  Surgeon: Irving Copas., MD;  Location: [place];  Service: Gastroenterology;;   Rosanne Gutting PYLORI  09/20/2011   Procedure: BREATH TEK H PYLORI;  Surgeon: Shann Medal, MD;  Location: [facility];  Service: General;  Laterality: N/A;   childbirth  01-02-12   x2 NVD- pre-exclampsia with pregrancy   CHOLECYSTECTOMY  11/23/2016   'w/whipple"   ENDOSCOPIC RETROGRADE CHOLANGIOPANCREATOGRAPHY (ERCP) WITH PROPOFOL N/A 05/05/2019   Procedure: ENDOSCOPIC RETROGRADE CHOLANGIOPANCREATOGRAPHY (ERCP) WITH PROPOFOL;  Surgeon: Irving Copas., MD;  Location: [place];  Service: Gastroenterology;  Laterality: N/A;   ENDOSCOPIC RETROGRADE CHOLANGIOPANCREATOGRAPHY (ERCP) WITH PROPOFOL N/A 10/20/2019   Procedure: ENDOSCOPIC RETROGRADE CHOLANGIOPANCREATOGRAPHY (ERCP) WITH PROPOFOL;  Surgeon: Rush Landmark Telford Nab., MD;  Location: [place];  Service: Gastroenterology;  Laterality: N/A;   ENDOSCOPIC RETROGRADE CHOLANGIOPANCREATOGRAPHY (ERCP) WITH PROPOFOL N/A 03/22/2020   Procedure: ENDOSCOPIC RETROGRADE CHOLANGIOPANCREATOGRAPHY (ERCP) WITH PROPOFOL;  Surgeon: Irving Copas., MD;  Location: [facility];  Service: Gastroenterology;  Laterality: N/A;    ESOPHAGOGASTRODUODENOSCOPY (EGD) WITH PROPOFOL N/A 05/05/2019   Procedure: ESOPHAGOGASTRODUODENOSCOPY (EGD) WITH PROPOFOL;  Surgeon: Rush Landmark Telford Nab., MD;  Location: [place];  Service: Gastroenterology;  Laterality: N/A;   EUS  12/07/2011   Procedure: UPPER ENDOSCOPIC ULTRASOUND (EUS) LINEAR;  Surgeon: Milus Banister, MD;  Location: [facility];  Service: Endoscopy;  Laterality: N/A;  radial linear   IR RADIOLOGIST EVAL & MGMT  01/11/2017   IR RADIOLOGIST EVAL & MGMT  09/30/2020   LIVER BIOPSY  12/2018   REMOVAL OF STONES  05/05/2019   Procedure: REMOVAL OF STONES;  Surgeon: Irving Copas., MD;  Location: [place];  Service: Gastroenterology;;   REMOVAL OF STONES  10/20/2019   Procedure: REMOVAL OF STONES;  Surgeon: Irving Copas., MD;  Location: [place];  Service: Gastroenterology;;   REMOVAL OF STONES  03/22/2020   Procedure: REMOVAL OF STONES;  Surgeon: Irving Copas., MD;  Location: [place];  Service: Gastroenterology;;   ROBOTIC ASSITED PARTIAL NEPHRECTOMY Left 10/02/2013   Procedure: ROBOTIC ASSITED PARTIAL NEPHRECTOMY;  Surgeon: Raynelle Bring, MD;  Location: [facility];  Service: Urology;  Laterality: Left;   ROBOTIC ASSITED PARTIAL NEPHRECTOMY Right 12/2011   STENT REMOVAL  10/20/2019   Procedure: STENT REMOVAL;  Surgeon: Irving Copas., MD;  Location: [place];  Service: Gastroenterology;;   Lavell Islam REMOVAL  03/22/2020   Procedure: STENT REMOVAL;  Surgeon: Irving Copas., MD;  Location: [place];  Service: Gastroenterology;;   SUBMUCOSAL TATTOO INJECTION  05/05/2019   Procedure: SUBMUCOSAL TATTOO INJECTION;  Surgeon: Irving Copas., MD;  Location: [place];  Service: Gastroenterology;;   VAGINAL HYSTERECTOMY  2008   WHIPPLE PROCEDURE N/A 11/23/2016   Procedure: WHIPPLE PROCEDURE;  Surgeon: Stark Klein, MD;  Location: [place];  Service: General;  Laterality: N/A;    Allergies: Patient has no known  allergies.  Medications: Prior to Admission medications   Medication Sig Start Date End Date Taking? Authorizing Provider  cetirizine (ZYRTEC) 10 MG tablet Take 10 mg by mouth daily as needed for allergies.    [provider]  citalopram (CELEXA) 20 MG tablet Take 20 mg by mouth daily with breakfast.     [provider]  naproxen sodium (ALEVE) 220 MG tablet Take 440 mg by mouth 2 (two) times daily as needed (pain).    [provider]     Family History  Problem Relation Age of Onset   Cancer Paternal Uncle        lung   AAA (abdominal aortic aneurysm) Maternal Grandfather        60s-70s   Colon cancer Neg Hx    Esophageal cancer Neg Hx    Inflammatory bowel disease Neg Hx    Liver disease Neg Hx    Pancreatic cancer Neg Hx    Rectal cancer Neg Hx    Stomach cancer Neg Hx     Social History   Socioeconomic History   Marital status: Married    Spouse name: Not on file   Number of children: 2   Years of education: Not on file   Highest education level: Not on file  Occupational History   Occupation: teacher    Employer: [employer]  Tobacco Use   Smoking status: Never   Smokeless tobacco: Never  Vaping Use   Vaping Use: Never used  Substance and Sexual Activity   Alcohol use: Yes    Comment:  "  beer twice/month; if that"   Drug use: No   Sexual activity: Not Currently    Birth control/protection: Surgical    Comment: Hysterectomy  Other Topics Concern   Not on file  Social History Narrative   Not on file   Social Determinants of Health   Financial Resource Strain: Not on file  Food Insecurity: Not on file  Transportation Needs: Not on file  Physical Activity: Not on file  Stress: Not on file  Social Connections: Not on file     Review of Systems: A 12 point ROS discussed and pertinent positives are indicated in the HPI above.  All other systems are negative.   Vital Signs: BP (!) 178/92   Pulse (!) 56   Temp  98.1 F (36.7 C) (Oral)   Resp 19   Ht 5\' 7"  (1.702 m)   Wt 225 lb (102.1 kg)   LMP  (LMP Unknown)   SpO2 99%   BMI 35.24 kg/m   Physical Exam  Vitals and nursing note reviewed.  Constitutional:      General: He is not in acute distress.    Appearance: Normal appearance.  HENT:     Head: Normocephalic and atraumatic.     Mouth/Throat:     Mouth: Mucous membranes are moist.     Pharynx: Oropharynx is clear.  Cardiovascular:     Rate and Rhythm: Normal rate and regular rhythm.     Pulses: Normal pulses.     Heart sounds: Normal heart sounds.  Pulmonary:     Effort: Pulmonary effort is normal.     Breath sounds: Normal breath sounds. No wheezing, rhonchi or rales.  Abdominal:     General: Bowel sounds are normal. There is no distension.     Palpations: Abdomen is soft.  Skin:    General: Skin is warm and dry.  Neurological:     Mental Status: He is alert and oriented to person, place, and time.  Psychiatric:        Mood and Affect: Mood normal.        Behavior: Behavior normal.    MD Evaluation Airway: WNL Heart: WNL Abdomen: WNL Chest/ Lungs: WNL ASA  Classification: 3 Mallampati/Airway Score: Two  Imaging: IR Radiologist Eval & Mgmt  Result Date: 09/30/2020 Please refer to notes tab for details about interventional procedure. (Op Note)   Labs:  CBC: Recent Labs    01/23/20 1439 10/18/20 1336 10/20/20 0638  WBC 7.5 7.3 6.7  HGB 11.5* 12.8 12.0  HCT 34.5* 39.3 36.8  PLT 314.0 387 340    COAGS: Recent Labs    01/23/20 1439 10/20/20 0638  INR 1.0 0.9  APTT  --  32    BMP: Recent Labs    10/18/20 1336 10/20/20 0638  NA 138 138  K 4.4 4.1  CL 104 105  CO2 26 27  GLUCOSE 143* 109*  BUN 15 14  CALCIUM 9.2 8.9  CREATININE 1.10* 1.04*  GFRNONAA >60 >60    LIVER FUNCTION TESTS: Recent Labs    04/06/20 1647 06/11/20 1025 09/14/20 0917 10/18/20 1336  BILITOT 0.5 0.5 0.6 0.8  AST 32 25 34 43*  ALT 34 34 37* 38  ALKPHOS 236* 153*  159* 173*  PROT 7.2 7.5 7.6 7.9  ALBUMIN 3.8 3.9 4.2 4.3    TUMOR MARKERS: No results for input(s): AFPTM, CEA, CA199, CHROMGRNA in the last 8760 hours.  Assessment and Plan: 50 y.o. female with past medical history significant for  sleep apnea, hypertension, previous Whipple for benign lesion, and bilateral renal cell carcinomas, post bilateral partial nephrectomies (right robotic partial nephrectomy on 01/08/2012; left robotic partial nephrectomy on 10/02/2013 both with pathology c/w grade II-III; clear cell RCC) who has been referred for evaluation potential candidacy for percutaneous cryoablation of new right-sided renal lesion.  Patient had a virtual consultation with Dr. Pascal Lux on 09/30/20, she was deemed an appropriate candidate for a cryoablation and possible biopsy of a right renal lesion and presents today for the procedure.    NPO since midnight  VSS INR 0.9 / aPPT 32/  CBC w/in normal range  BMP with mildly elevated Cr, 1.04   Risks and benefits of image guided renal cryoablation was discussed with the patient including, but not limited to, failure to treat entire lesion, bleeding, infection, damage to adjacent structures, hematuria, urine leak, decrease in renal function or post procedural neuropathy.  All of the patient's questions were answered and the patient is agreeable to proceed. Consent signed and in chart.   Thank you for this interesting consult.  I greatly enjoyed meeting Krystal Reynolds and look forward to participating in their care.  A copy of this report was sent to the requesting provider on this date.  Electronically Signed: Tera Mater, PA-C 10/20/2020, 8:15 AM   I spent a total of  30 Minutes  in face to face in clinical consultation, greater than 50% of which was counseling/coordinating care for cryoablation and possible biopsy of a right renal lesion

## 2020-10-19 NOTE — H&P (Deleted)
  The note originally documented on this encounter has been moved the the encounter in which it belongs.  

## 2020-10-20 ENCOUNTER — Encounter (HOSPITAL_COMMUNITY): Payer: Self-pay | Admitting: Interventional Radiology

## 2020-10-20 ENCOUNTER — Encounter (HOSPITAL_COMMUNITY): Payer: Self-pay

## 2020-10-20 ENCOUNTER — Ambulatory Visit (HOSPITAL_COMMUNITY): Payer: BC Managed Care – PPO | Admitting: Anesthesiology

## 2020-10-20 ENCOUNTER — Encounter (HOSPITAL_COMMUNITY)
Admission: RE | Disposition: A | Discharge status: Home / Self Care | Payer: Self-pay | Source: Home / Self Care | Attending: Interventional Radiology

## 2020-10-20 ENCOUNTER — Other Ambulatory Visit: Payer: Self-pay

## 2020-10-20 ENCOUNTER — Ambulatory Visit (HOSPITAL_COMMUNITY): Payer: BC Managed Care – PPO | Admitting: Physician Assistant

## 2020-10-20 ENCOUNTER — Observation Stay (HOSPITAL_COMMUNITY)
Admission: RE | Admit: 2020-10-20 | Discharge: 2020-10-21 | Disposition: A | Discharge status: Home / Self Care | Payer: BC Managed Care – PPO | Attending: Interventional Radiology | Admitting: Interventional Radiology

## 2020-10-20 ENCOUNTER — Ambulatory Visit (HOSPITAL_COMMUNITY)
Admission: RE | Admit: 2020-10-20 | Discharge: 2020-10-20 | Disposition: A | Discharge status: Home / Self Care | Payer: BC Managed Care – PPO | Source: Ambulatory Visit | Attending: Interventional Radiology | Admitting: Interventional Radiology

## 2020-10-20 DIAGNOSIS — Z79899 Other long term (current) drug therapy: Secondary | ICD-10-CM | POA: Insufficient documentation

## 2020-10-20 DIAGNOSIS — C641 Malignant neoplasm of right kidney, except renal pelvis: Secondary | ICD-10-CM | POA: Diagnosis not present

## 2020-10-20 DIAGNOSIS — Z905 Acquired absence of kidney: Secondary | ICD-10-CM | POA: Insufficient documentation

## 2020-10-20 DIAGNOSIS — N189 Chronic kidney disease, unspecified: Secondary | ICD-10-CM | POA: Insufficient documentation

## 2020-10-20 DIAGNOSIS — Z9049 Acquired absence of other specified parts of digestive tract: Secondary | ICD-10-CM | POA: Diagnosis not present

## 2020-10-20 DIAGNOSIS — I129 Hypertensive chronic kidney disease with stage 1 through stage 4 chronic kidney disease, or unspecified chronic kidney disease: Secondary | ICD-10-CM | POA: Insufficient documentation

## 2020-10-20 HISTORY — PX: RADIOLOGY WITH ANESTHESIA: SHX6223

## 2020-10-20 LAB — BASIC METABOLIC PANEL
Anion gap: 6 (ref 5–15)
BUN: 14 mg/dL (ref 6–20)
CO2: 27 mmol/L (ref 22–32)
Calcium: 8.9 mg/dL (ref 8.9–10.3)
Chloride: 105 mmol/L (ref 98–111)
Creatinine, Ser: 1.04 mg/dL — ABNORMAL HIGH (ref 0.44–1.00)
GFR, Estimated: >60 mL/min (ref >60)
Glucose, Bld: 109 mg/dL — ABNORMAL HIGH (ref 70–99)
Potassium: 4.1 mmol/L (ref 3.5–5.1)
Sodium: 138 mmol/L (ref 135–145)

## 2020-10-20 LAB — APTT: aPTT: 32 seconds (ref 24–36)

## 2020-10-20 LAB — CBC
HCT: 36.8 % (ref 36.0–46.0)
Hemoglobin: 12 g/dL (ref 12.0–15.0)
MCH: 27.5 pg (ref 26.0–34.0)
MCHC: 32.6 g/dL (ref 30.0–36.0)
MCV: 84.2 fL (ref 80.0–100.0)
Platelets: 340 10*3/uL (ref 150–400)
RBC: 4.37 MIL/uL (ref 3.87–5.11)
RDW: 13.9 % (ref 11.5–15.5)
WBC: 6.7 10*3/uL (ref 4.0–10.5)
nRBC: 0 % (ref 0.0–0.2)

## 2020-10-20 LAB — PROTIME-INR
INR: 0.9 (ref 0.8–1.2)
Prothrombin Time: 12.5 seconds (ref 11.4–15.2)

## 2020-10-20 SURGERY — IR WITH ANESTHESIA
Anesthesia: General

## 2020-10-20 MED ORDER — PROPOFOL 1000 MG/100ML IV EMUL
INTRAVENOUS | Status: AC
  Filled 2020-10-20: qty 100

## 2020-10-20 MED ORDER — ORAL CARE MOUTH RINSE: 15.0000 mL | Freq: Once | OROMUCOSAL | Status: AC

## 2020-10-20 MED ORDER — OXYCODONE HCL 5 MG PO TABS: 5.0000 mg | ORAL_TABLET | Freq: Once | ORAL | Status: DC | PRN

## 2020-10-20 MED ORDER — DOCUSATE SODIUM 100 MG PO CAPS
100.0000 mg | ORAL_CAPSULE | Freq: Two times a day (BID) | ORAL | Status: DC
  Administered 2020-10-20 – 2020-10-21 (×2): 100 mg via ORAL
  Filled 2020-10-20 (×2): qty 1

## 2020-10-20 MED ORDER — FENTANYL CITRATE (PF) 100 MCG/2ML IJ SOLN: 25.0000 ug | INTRAMUSCULAR | Status: DC | PRN

## 2020-10-20 MED ORDER — SODIUM CHLORIDE 0.9 % IV SOLN
INTRAVENOUS | Status: AC
  Filled 2020-10-20: qty 250

## 2020-10-20 MED ORDER — CITALOPRAM HYDROBROMIDE 20 MG PO TABS
20.0000 mg | ORAL_TABLET | Freq: Every day | ORAL | Status: DC
  Administered 2020-10-21: 20 mg via ORAL
  Filled 2020-10-20: qty 1

## 2020-10-20 MED ORDER — DIPHENHYDRAMINE HCL 50 MG/ML IJ SOLN: 12.5000 mg | Freq: Four times a day (QID) | INTRAMUSCULAR | Status: DC | PRN

## 2020-10-20 MED ORDER — SUGAMMADEX SODIUM 200 MG/2ML IV SOLN
INTRAVENOUS | Status: DC | PRN
  Administered 2020-10-20: 200 mg via INTRAVENOUS

## 2020-10-20 MED ORDER — OXYCODONE HCL 5 MG/5ML PO SOLN: 5.0000 mg | Freq: Once | ORAL | Status: DC | PRN

## 2020-10-20 MED ORDER — LIDOCAINE HCL (CARDIAC) PF 100 MG/5ML IV SOSY
PREFILLED_SYRINGE | INTRAVENOUS | Status: DC | PRN
  Administered 2020-10-20: 100 mg via INTRAVENOUS

## 2020-10-20 MED ORDER — CEFAZOLIN SODIUM-DEXTROSE 2-4 GM/100ML-% IV SOLN
INTRAVENOUS | Status: AC
  Filled 2020-10-20: qty 100

## 2020-10-20 MED ORDER — HYDRALAZINE HCL 20 MG/ML IJ SOLN
INTRAMUSCULAR | Status: AC
  Filled 2020-10-20: qty 1

## 2020-10-20 MED ORDER — PHENYLEPHRINE 40 MCG/ML (10ML) SYRINGE FOR IV PUSH (FOR BLOOD PRESSURE SUPPORT)
PREFILLED_SYRINGE | INTRAVENOUS | Status: DC | PRN
  Administered 2020-10-20 (×2): 120 ug via INTRAVENOUS

## 2020-10-20 MED ORDER — CEFAZOLIN SODIUM-DEXTROSE 2-4 GM/100ML-% IV SOLN
2.0000 g | Freq: Once | INTRAVENOUS | Status: AC
  Administered 2020-10-20: 2 g via INTRAVENOUS

## 2020-10-20 MED ORDER — CHLORHEXIDINE GLUCONATE 0.12 % MT SOLN
15.0000 mL | Freq: Once | OROMUCOSAL | Status: AC
  Administered 2020-10-20: 15 mL via OROMUCOSAL
  Filled 2020-10-20: qty 15

## 2020-10-20 MED ORDER — ROCURONIUM BROMIDE 100 MG/10ML IV SOLN
INTRAVENOUS | Status: DC | PRN
  Administered 2020-10-20: 70 mg via INTRAVENOUS

## 2020-10-20 MED ORDER — NALOXONE HCL 0.4 MG/ML IJ SOLN: 0.4000 mg | INTRAMUSCULAR | Status: DC | PRN

## 2020-10-20 MED ORDER — PROPOFOL 10 MG/ML IV BOLUS
INTRAVENOUS | Status: DC | PRN
  Administered 2020-10-20: 170 mg via INTRAVENOUS

## 2020-10-20 MED ORDER — SODIUM CHLORIDE 0.9% FLUSH: 9.0000 mL | INTRAVENOUS | Status: DC | PRN

## 2020-10-20 MED ORDER — EPHEDRINE SULFATE-NACL 50-0.9 MG/10ML-% IV SOSY
PREFILLED_SYRINGE | INTRAVENOUS | Status: DC | PRN
  Administered 2020-10-20: 15 mg via INTRAVENOUS

## 2020-10-20 MED ORDER — MIDAZOLAM HCL 5 MG/5ML IJ SOLN
INTRAMUSCULAR | Status: DC | PRN
  Administered 2020-10-20: 2 mg via INTRAVENOUS

## 2020-10-20 MED ORDER — FENTANYL CITRATE (PF) 100 MCG/2ML IJ SOLN
INTRAMUSCULAR | Status: AC
  Filled 2020-10-20: qty 2

## 2020-10-20 MED ORDER — SODIUM CHLORIDE 0.9 % IV SOLN: INTRAVENOUS | Status: DC

## 2020-10-20 MED ORDER — SODIUM CHLORIDE 0.9 % IV SOLN
INTRAVENOUS | Status: AC
  Filled 2020-10-20: qty 1000

## 2020-10-20 MED ORDER — FENTANYL CITRATE (PF) 100 MCG/2ML IJ SOLN
INTRAMUSCULAR | Status: DC | PRN
  Administered 2020-10-20: 100 ug via INTRAVENOUS

## 2020-10-20 MED ORDER — FENTANYL 50 MCG/ML IV PCA SOLN
INTRAVENOUS | Status: DC
  Filled 2020-10-20: qty 20

## 2020-10-20 MED ORDER — MIDAZOLAM HCL 2 MG/2ML IJ SOLN
INTRAMUSCULAR | Status: AC
  Filled 2020-10-20: qty 2

## 2020-10-20 MED ORDER — DIPHENHYDRAMINE HCL 12.5 MG/5ML PO ELIX
12.5000 mg | ORAL_SOLUTION | Freq: Four times a day (QID) | ORAL | Status: DC | PRN
  Filled 2020-10-20: qty 5

## 2020-10-20 MED ORDER — LORATADINE 10 MG PO TABS
10.0000 mg | ORAL_TABLET | Freq: Every day | ORAL | Status: DC
  Administered 2020-10-21: 10 mg via ORAL
  Filled 2020-10-20: qty 1

## 2020-10-20 MED ORDER — ONDANSETRON HCL 4 MG/2ML IJ SOLN
INTRAMUSCULAR | Status: DC | PRN
  Administered 2020-10-20: 4 mg via INTRAVENOUS

## 2020-10-20 MED ORDER — ONDANSETRON HCL 4 MG/2ML IJ SOLN: 4.0000 mg | Freq: Four times a day (QID) | INTRAMUSCULAR | Status: DC | PRN

## 2020-10-20 MED ORDER — LACTATED RINGERS IV SOLN: INTRAVENOUS | Status: DC

## 2020-10-20 MED ORDER — DEXAMETHASONE SODIUM PHOSPHATE 10 MG/ML IJ SOLN
INTRAMUSCULAR | Status: DC | PRN
  Administered 2020-10-20: 10 mg via INTRAVENOUS

## 2020-10-20 MED ORDER — PROMETHAZINE HCL 25 MG/ML IJ SOLN: 6.2500 mg | INTRAMUSCULAR | Status: DC | PRN

## 2020-10-20 NOTE — Sedation Documentation (Signed)
Anesthesia in to sedate and monitor. 

## 2020-10-20 NOTE — Transfer of Care (Signed)
Immediate Anesthesia Transfer of Care Note  Patient: Krystal Reynolds  Procedure(s) Performed: IR WITH ANESTHESIA CRYOABLATION  Patient Location: PACU  Anesthesia Type:General  Level of Consciousness: awake, alert  and oriented  Airway & Oxygen Therapy: Patient Spontanous Breathing and Patient connected to face mask oxygen  Post-op Assessment: Report given to RN and Post -op Vital signs reviewed and stable  Post vital signs: Reviewed and stable  Last Vitals:  Vitals Value Taken Time  BP 178/86 10/20/20 1110  Temp 36.6 C 10/20/20 1110  Pulse 73 10/20/20 1115  Resp 16 10/20/20 1115  SpO2 100 % 10/20/20 1115  Vitals shown include unvalidated device data.  Last Pain:  Vitals:   10/20/20 1110  PainSc: Asleep         Complications: No notable events documented.

## 2020-10-20 NOTE — Procedures (Signed)
Pre procedural Dx: Right sided renal lesion Post procedural Dx: Same  Technically successful CT guided cryoablation of right sided renal lesion.    EBL: Trace.  Complications: None immediate.   Ronny Bacon, MD Pager #: [phone]

## 2020-10-20 NOTE — Progress Notes (Signed)
Patient is s/p right renal cryoablation with Dr. Pascal Lux this morning.   Patient evaluated at the bedside post procedure.  States that she is doing great, denies symptoms of acute bleeding. Denies right flank pain.   Right flank puncture site is unremarkable with no erythema, tenderness, no sings of active bleeding or infection.  Small amount of dried blood noted on dressing, dressing clean and dry otherwise.   Pla n to discharge tomorrow if pt remains stable.  IR to follow.

## 2020-10-20 NOTE — Anesthesia Postprocedure Evaluation (Signed)
Anesthesia Post Note  Patient: Krystal Reynolds  Procedure(s) Performed: IR WITH ANESTHESIA CRYOABLATION     Patient location during evaluation: PACU Anesthesia Type: General Level of consciousness: awake and alert Pain management: pain level controlled Vital Signs Assessment: post-procedure vital signs reviewed and stable Respiratory status: spontaneous breathing, nonlabored ventilation, respiratory function stable and patient connected to nasal cannula oxygen Cardiovascular status: blood pressure returned to baseline and stable Postop Assessment: no apparent nausea or vomiting Anesthetic complications: no   No notable events documented.  Last Vitals:  Vitals:   10/20/20 1200 10/20/20 1221  BP: (!) 180/74 (!) 178/80  Pulse: (!) 57 61  Resp: 14 16  Temp:  36.8 C  SpO2: 100% 98%    Last Pain:  Vitals:   10/20/20 1221  TempSrc: Oral  PainSc:                  Barnet [place]

## 2020-10-20 NOTE — Anesthesia Procedure Notes (Signed)
Procedure Name: Intubation Date/Time: 10/20/2020 8:58 AM Performed by: [place] Charmika Macdonnell C, CRNA Pre-anesthesia Checklist: Patient identified, Emergency Drugs available, Suction available and Patient being monitored Patient Re-evaluated:Patient Re-evaluated prior to induction Oxygen Delivery Method: Circle system utilized Preoxygenation: Pre-oxygenation with 100% oxygen Induction Type: IV induction Ventilation: Mask ventilation without difficulty Laryngoscope Size: Mac and 3 Grade View: Grade II Tube type: Oral Tube size: 7.0 mm Number of attempts: 1 Airway Equipment and Method: Stylet and Oral airway Placement Confirmation: ETT inserted through vocal cords under direct vision, positive ETCO2 and breath sounds checked- equal and bilateral Secured at: 20 cm Tube secured with: Tape Dental Injury: Teeth and Oropharynx as per pre-operative assessment

## 2020-10-21 ENCOUNTER — Encounter (HOSPITAL_COMMUNITY): Payer: Self-pay | Admitting: Interventional Radiology

## 2020-10-21 DIAGNOSIS — C641 Malignant neoplasm of right kidney, except renal pelvis: Secondary | ICD-10-CM | POA: Diagnosis not present

## 2020-10-21 LAB — PLATELET INHIBITION P2Y12: Platelet Function  P2Y12: 206 [PRU] (ref 182–335)

## 2020-10-21 NOTE — Progress Notes (Signed)
Pt discharged home with spouse in stable condition. Discharge instructions given. No questions or concerns at this time. Pt opted to ambulate off the department.

## 2020-10-21 NOTE — Discharge Summary (Signed)
Patient ID: Krystal Reynolds MRN: [id number] DOB/AGE: 49-Oct-1972 49 y.o.  Admit date: 10/20/2020 Discharge date: 10/21/2020  Supervising Physician: Sandi Mariscal  Patient Status: [facility] - In-pt  Admission Diagnoses: Renal cell carcinoma of right kidney  Discharge Diagnoses:  Active Problems:   Renal cell carcinoma of right kidney [facility])   Discharged Condition: stable  Hospital Course:  Patient presented to [facility] 10/20/2020 for an image-guided cryoablation of right-sided renal lesion. Procedure occurred without major complications and patient was transferred to floor in stable condition (VSS, right flank puncture sites stable) for overnight observation. No major events occurred overnight.  Patient awake and alert sitting in bed watching TV with no complaints at this time. Foley has been removed, denies hematuria. Denies right flank pain. Tolerating PO intake. Plan to discharge home today and follow-up with Dr. Pascal Lux for televisit 3-4 weeks after discharge.   Consults: None  Significant Diagnostic Studies: CT GUIDE TISSUE ABLATION  Result Date: 10/20/2020 CLINICAL DATA:  Indeterminate right-sided renal lesion compatible with renal cell carcinoma. Patient presents today for image guided cryoablation and potential biopsy. EXAM: CT-GUIDED RIGHT RENAL CRYOABLATION ANESTHESIA/SEDATION: General MEDICATIONS: Ancef 2 gm IV .The antibiotic was administered in an appropriate time interval prior to needle puncture of the skin. CONTRAST:  None PROCEDURE: The procedure, risks, benefits, and alternatives were explained to the patient. Questions regarding the procedure were encouraged and answered. The patient understands and consents to the procedure. The patient was placed under general anesthesia. Initial un-enhanced CT was performed in a prone position to localize the approximately 1.7 x 1.6 cm partially exophytic lesion arising from the medial interpolar aspect of the right kidney (image 44, series 2).  Sonographic evaluation was then performed of the right kidney however failed to confidently demonstrate renal lesion. The operative site was prepped with chlorhexidine in a sterile fashion, and a sterile drape was applied covering the operative field. Procedure trajectory planning was performed with the use of a 22 gauge spinal needles. Next, a ice force percutaneous cryoablation probe was advanced into the partially exophytic right-sided renal lesion. Probe positioning was confirmed with CT imaging and the probe was locked in place. Next, a 66 gauge trocar needle was advanced towards the renal lesion however ultimately was not utilized for biopsy purposes given the small size of the lesion. Rather, the trocar needle was utilized for the administration of hydrodissection. Next, the cryoablation was performed with a 10 minute cycle of cryoablation followed by a 10 minute thaw cycle and a second 10 minute cycle of cryoablation. During ablation, periodic CT imaging was performed to monitor ice ball formation and morphology. After active thaw, the cryoablation probes were removed. Dressings were applied. The patient tolerated the procedure well without immediate postprocedural complication. COMPLICATIONS: None immediate. FINDINGS: Preprocedural imaging demonstrates grossly unchanged size and appearance of the approximately 1.7 x 1.6 cm partially exophytic lesion arising from the medial interpolar aspect the right kidney (image 44, series 2). Under intermittent CT guidance, the cryoablation probe was appropriately positioned through the long axis of the partially exophytic right-sided renal lesion. Biopsy was not performed secondary to small size and positioning of the cryoablation probe. Administration of hydrodissection demonstrates adequate displacement of the medial aspect of the kidney from the right paraspinal soft tissues. Surveillance imaging demonstrates the ice ball encompassing the entirety of the expected  location of the partially exophytic right-sided renal lesion (series 12 and 13). IMPRESSION: Technically successful CT guided percutaneous cryoablation of partially exophytic right-sided renal lesion. PLAN: - The patient  will be admitted overnight for continued observation and PCA usage - Postprocedural telemedicine consultation will be performed in 3-4 weeks following acquisition of postprocedural creatinine level. - Initial surveillance imaging will be performed with MRI in 3 months (late September 2022). Electronically Signed   By: Sandi Mariscal M.D.   On: 10/20/2020 11:51   IR Radiologist Eval & Mgmt  Result Date: 09/30/2020 Please refer to notes tab for details about interventional procedure. (Op Note)   Treatments: CT-guided cryoablation of right-sided renal lesion.  Discharge Exam: Blood pressure 134/73, pulse (!) 52, temperature (!) 97.4 F (36.3 C), temperature source Oral, resp. rate 16, height 5\' 7"  (1.702 m), weight 225 lb (102.1 kg), SpO2 97 %. Physical Exam Vitals and nursing note reviewed.  Constitutional:      General: She is not in acute distress. Cardiovascular:     Rate and Rhythm: Normal rate and regular rhythm.     Heart sounds: Normal heart sounds. No murmur heard. Pulmonary:     Effort: Pulmonary effort is normal. No respiratory distress.     Breath sounds: Normal breath sounds. No wheezing.  Abdominal:     Comments: Right flank puncture sites soft without tenderness, erythema, drainage, active bleeding, or hematoma.  Skin:    General: Skin is warm and dry.  Neurological:     Mental Status: She is alert and oriented to person, place, and time.    Disposition: Discharge disposition: 01-Home or Self Care       Discharge Instructions     Call MD for:  difficulty breathing, headache or visual disturbances   Complete by: As directed    Call MD for:  extreme fatigue   Complete by: As directed    Call MD for:  hives   Complete by: As directed    Call MD for:   persistant dizziness or light-headedness   Complete by: As directed    Call MD for:  persistant nausea and vomiting   Complete by: As directed    Call MD for:  redness, tenderness, or signs of infection (pain, swelling, redness, odor or green/yellow discharge around incision site)   Complete by: As directed    Call MD for:  severe uncontrolled pain   Complete by: As directed    Call MD for:  temperature >100.4   Complete by: As directed    Diet - low sodium heart healthy   Complete by: As directed    Increase activity slowly   Complete by: As directed    Lifting restrictions   Complete by: As directed    No lifting more than 10 pounds for 7 days post-procedure.   Remove dressing in 24 hours   Complete by: As directed    Ok to shower 48 hours post-procedure. Recommend showering with bandage on, remove bandage immediately after showering and pat area dry. No further dressing changes needed after this- ensure area remains clean/dry until fully healed. No submerging (swimming, bathing) for 7 days post-procedure.      Allergies as of 10/21/2020   No Known Allergies      Medication List     TAKE these medications    cetirizine 10 MG tablet Commonly known as: ZYRTEC Take 10 mg by mouth daily as needed for allergies.   citalopram 20 MG tablet Commonly known as: CELEXA Take 20 mg by mouth daily with breakfast.   naproxen sodium 220 MG tablet Commonly known as: ALEVE Take 440 mg by mouth 2 (two) times daily as needed (  pain).        Follow-up Information     Sandi Mariscal, MD Follow up in 4 week(s).   Specialties: Interventional Radiology, Radiology Why: Please follow-up with Dr. Pascal Lux for televisit 3-4 weeks after discharge. Our office will call you to set up this appointment. Contact information: [place] [address] [phone]                  Electronically Signed: Earley Abide, PA-C 10/21/2020, 9:58 AM   I have spent Less  Than 30 Minutes discharging Ladona Mow.

## 2020-10-21 NOTE — Discharge Instructions (Signed)
Cryoablation of Renal Tumors, Care After  This sheet gives you information about how to care for yourself after your procedure. Your health care provider may also give you more specific instructions. If you have problems or questions, contact your health care provider.  What can I expect after the procedure? After your procedure, it is common to have pain and discomfort in the upper abdomen. You may be given prescription pain medicines to control this if the pain is severe.   Follow these instructions at home: Puncture site care Follow instructions from your health care provider about how to take care of your incision. Make sure you:  - Wash your hands with soap and water before you change your bandage (dressing). If soap and water are not available, use hand sanitizer.  - Ok to shower 48 hours following procedure. Recommend showering with bandage on, remove bandage immediately after showering and pat area dry. No further dressing changes needed after this- ensure area remains clean and dry until fully healed.  - No submerging (swimming, bathing) for 7 days post-procedure. Check your puncture site area every day for signs of infection. Check for: - Redness, swelling, or pain. - Fluid or blood. - Warmth. - Pus or a bad smell. Activity Rest as often as needing during the first few days of recovery. No stooping, bending, or lifting more than 10 pounds for 1 week.  General instructions To prevent or treat constipation while you are taking prescription pain medicine, your health care provider may recommend that you: - Drink enough fluid to keep your urine clear or pale yellow. - Take over-the-counter or prescription medicines. - Eat foods that are high in fiber, such as fresh fruits and vegetables, whole grains, and beans. - Limit foods that are high in fat and processed sugars, such as fried and sweet foods. - Do not use any products that contain nicotine or tobacco, such as cigarettes and  e-cigarettes. If you need help quitting, ask your health care provider. - Keep all follow-up visits as told by your health care provider. This is important.  3-4 week televisit with the doctor who performed the procedure. Our office will call you to set up the appointment.   Contact a health care provider if: You cannot pass gas. You are unable to have a bowel movement within 3 days. You have a skin rash.  Get help right away if: You have a fever. You have severe or lasting pain in your abdomen, shoulder, or back. You have trouble swallowing or breathing. You have severe weakness or dizziness. You have chest pain or shortness of breath.   This information is not intended to replace advice given to you by your health care provider. Make sure you discuss any questions you have with your health care provider. 

## 2020-11-08 ENCOUNTER — Other Ambulatory Visit: Payer: Self-pay | Admitting: *Deleted

## 2020-11-08 DIAGNOSIS — N2889 Other specified disorders of kidney and ureter: Secondary | ICD-10-CM

## 2020-12-16 ENCOUNTER — Ambulatory Visit
Admission: RE | Admit: 2020-12-16 | Discharge: 2020-12-16 | Disposition: A | Discharge status: Home / Self Care | Payer: BC Managed Care – PPO | Source: Ambulatory Visit | Attending: Student | Admitting: Student

## 2020-12-16 ENCOUNTER — Other Ambulatory Visit: Payer: Self-pay

## 2020-12-16 DIAGNOSIS — C641 Malignant neoplasm of right kidney, except renal pelvis: Secondary | ICD-10-CM

## 2020-12-16 HISTORY — PX: IR RADIOLOGIST EVAL & MGMT: IMG5224

## 2020-12-16 NOTE — Progress Notes (Signed)
Patient ID: Krystal Reynolds, female   DOB: [birth year], 50 y.o.   MRN: [id number]        Chief Complaint: Right sided renal lesion c/w RCC   Referring Physician(s): Borden,Lester   History of Present Illness:  Krystal Reynolds is a 50 y.o. female with past medical history significant for sleep apnea, hypertension, previous Whipple (for a benign lesion), and bilateral renal cell carcinomas (VHL testing previously has been negative), post bilateral partial nephrectomies (right robotic partial nephrectomy on 01/08/2012; left robotic partial nephrectomy on 10/02/2013 - both with pathology c/w grade II-III; clear cell RCC) who underwent a technically successful right-sided renal cryoablation on 09/09/2020 (note, biopsy was not performed at the time of the ablation given small size of the lesion).  The patient is seen today in telemedicine consultation for postprocedural evaluation management.  Patient states she recovered uneventfully from the cryoablation.  Specifically, she denied any hematuria or flank pain.  She has returned to all activities of daily living and is without complaint.   Past Medical History:  Diagnosis Date   Chronic kidney disease 01/02/2012   dx. rt. kidney neoplasm-surgery planned, past hx. protein in urine.   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 episodes of pitting edema lower extremities at end of day.   Preeclampsia 1998   Resolved   Pregnancy induced hypertension 1998   Resolved   Sleep apnea    "dx'd; used mask for ~ 1 yr; lost weight; no longer wear mask" (12/21/2016)   Thyroid enlargement 01/02/2012   left thyroid enlargement, being watched, S/P [place]; no tx.    Past Surgical History:  Procedure Laterality Date   BILIARY DILATION  05/05/2019   Procedure: BILIARY  DILATION;  Surgeon: Mansouraty, Telford Nab., MD;  Location: [place];  Service: Gastroenterology;;   BILIARY DILATION  10/20/2019   Procedure: BILIARY DILATION;  Surgeon: Irving Copas., MD;  Location: [place];  Service: Gastroenterology;;   BILIARY DILATION  03/22/2020   Procedure: BILIARY DILATION;  Surgeon: Irving Copas., MD;  Location: [place];  Service: Gastroenterology;;   BILIARY STENT PLACEMENT  05/05/2019   Procedure: BILIARY STENT PLACEMENT;  Surgeon: Irving Copas., MD;  Location: [place];  Service: Gastroenterology;;   BILIARY STENT PLACEMENT  10/20/2019   Procedure: BILIARY STENT PLACEMENT;  Surgeon: Irving Copas., MD;  Location: [place];  Service: Gastroenterology;;   Rosanne Gutting PYLORI  09/20/2011   Procedure: BREATH TEK H PYLORI;  Surgeon: Shann Medal, MD;  Location: [facility];  Service: General;  Laterality: N/A;   childbirth  01-02-12   x2 NVD- pre-exclampsia with pregrancy   CHOLECYSTECTOMY  11/23/2016   'w/whipple"   ENDOSCOPIC RETROGRADE CHOLANGIOPANCREATOGRAPHY (ERCP) WITH PROPOFOL N/A 05/05/2019   Procedure: ENDOSCOPIC RETROGRADE CHOLANGIOPANCREATOGRAPHY (ERCP) WITH PROPOFOL;  Surgeon: Irving Copas., MD;  Location: [place];  Service: Gastroenterology;  Laterality: N/A;   ENDOSCOPIC RETROGRADE CHOLANGIOPANCREATOGRAPHY (ERCP) WITH PROPOFOL N/A 10/20/2019   Procedure: ENDOSCOPIC RETROGRADE CHOLANGIOPANCREATOGRAPHY (ERCP) WITH PROPOFOL;  Surgeon: Rush Landmark Telford Nab., MD;  Location: [place];  Service: Gastroenterology;  Laterality: N/A;   ENDOSCOPIC RETROGRADE CHOLANGIOPANCREATOGRAPHY (ERCP) WITH PROPOFOL N/A 03/22/2020   Procedure: ENDOSCOPIC RETROGRADE CHOLANGIOPANCREATOGRAPHY (ERCP) WITH PROPOFOL;  Surgeon: Rush Landmark Telford Nab., MD;  Location: [place];  Service: Gastroenterology;  Laterality: N/A;   ESOPHAGOGASTRODUODENOSCOPY (EGD) WITH PROPOFOL N/A 05/05/2019   Procedure:  ESOPHAGOGASTRODUODENOSCOPY (EGD) WITH PROPOFOL;  Surgeon: Rush Landmark Telford Nab., MD;  Location: [place];  Service: Gastroenterology;  Laterality: N/A;   EUS  12/07/2011   Procedure: UPPER ENDOSCOPIC ULTRASOUND (EUS) LINEAR;  Surgeon: Milus Banister, MD;  Location: [facility];  Service: Endoscopy;  Laterality: N/A;  radial linear   IR RADIOLOGIST EVAL & MGMT  01/11/2017   IR RADIOLOGIST EVAL & MGMT  09/30/2020   LIVER BIOPSY  12/2018   RADIOLOGY WITH ANESTHESIA N/A 10/20/2020   Procedure: IR WITH ANESTHESIA CRYOABLATION;  Surgeon: Sandi Mariscal, MD;  Location: [facility];  Service: Anesthesiology;  Laterality: N/A;   REMOVAL OF STONES  05/05/2019   Procedure: REMOVAL OF STONES;  Surgeon: Rush Landmark Telford Nab., MD;  Location: [place];  Service: Gastroenterology;;   REMOVAL OF STONES  10/20/2019   Procedure: REMOVAL OF STONES;  Surgeon: Irving Copas., MD;  Location: [place];  Service: Gastroenterology;;   REMOVAL OF STONES  03/22/2020   Procedure: REMOVAL OF STONES;  Surgeon: Irving Copas., MD;  Location: [place];  Service: Gastroenterology;;   ROBOTIC ASSITED PARTIAL NEPHRECTOMY Left 10/02/2013   Procedure: ROBOTIC ASSITED PARTIAL NEPHRECTOMY;  Surgeon: Raynelle Bring, MD;  Location: [facility];  Service: Urology;  Laterality: Left;   ROBOTIC ASSITED PARTIAL NEPHRECTOMY Right 12/2011   STENT REMOVAL  10/20/2019   Procedure: STENT REMOVAL;  Surgeon: Irving Copas., MD;  Location: [place];  Service: Gastroenterology;;   Lavell Islam REMOVAL  03/22/2020   Procedure: STENT REMOVAL;  Surgeon: Irving Copas., MD;  Location: [place];  Service: Gastroenterology;;   SUBMUCOSAL TATTOO INJECTION  05/05/2019   Procedure: SUBMUCOSAL TATTOO INJECTION;  Surgeon: Irving Copas., MD;  Location: [place];  Service: Gastroenterology;;   VAGINAL HYSTERECTOMY  2008   WHIPPLE PROCEDURE N/A 11/23/2016   Procedure: WHIPPLE PROCEDURE;  Surgeon: Stark Klein, MD;   Location: [place];  Service: General;  Laterality: N/A;    Allergies: Patient has no known allergies.  Medications: Prior to Admission medications   Medication Sig Start Date End Date Taking? Authorizing Provider  cetirizine (ZYRTEC) 10 MG tablet Take 10 mg by mouth daily as needed for allergies.    [provider]  citalopram (CELEXA) 20 MG tablet Take 20 mg by mouth daily with breakfast.     [provider]  naproxen sodium (ALEVE) 220 MG tablet Take 440 mg by mouth 2 (two) times daily as needed (pain).    [provider]     Family History  Problem Relation Age of Onset   Cancer Paternal Uncle        lung   AAA (abdominal aortic aneurysm) Maternal Grandfather        60s-70s   Colon cancer Neg Hx    Esophageal cancer Neg Hx    Inflammatory bowel disease Neg Hx    Liver disease Neg Hx    Pancreatic cancer Neg Hx    Rectal cancer Neg Hx    Stomach cancer Neg Hx     Social History   Socioeconomic History   Marital status: Married    Spouse name: Not on file   Number of children: 2   Years of education: Not on file   Highest education level: Not on file  Occupational History   Occupation: teacher    Employer: [employer]  Tobacco Use   Smoking status: Never   Smokeless tobacco:  Never  Vaping Use   Vaping Use: Never used  Substance and Sexual Activity   Alcohol use: Yes    Comment:  "beer twice/month; if that"   Drug use: No   Sexual activity: Not Currently    Birth control/protection: Surgical    Comment: Hysterectomy  Other Topics Concern   Not on file  Social History Narrative   Not on file   Social Determinants of Health   Financial Resource Strain: Not on file  Food Insecurity: Not on file  Transportation Needs: Not on file  Physical Activity: Not on file  Stress: Not on file  Social Connections: Not on file    ECOG Status: 0 - Asymptomatic  Review of Systems  Review of Systems: A 12 point ROS discussed and  pertinent positives are indicated in the HPI above.  All other systems are negative.  Physical Exam No direct physical exam was performed (except for noted visual exam findings with Video Visits).   Vital Signs: LMP  (LMP Unknown)   Imaging:  Abdominal MRI-09/09/2020. CT-guided right renal cryoablation-10/20/2020  Images demonstrate the cryoablation site encompassing the entirety of the partially exophytic lesion arising from the posterior interpolar aspect of the right kidney (series 11, series 12).   Labs:  CBC: Recent Labs    01/23/20 1439 10/18/20 1336 10/20/20 0638  WBC 7.5 7.3 6.7  HGB 11.5* 12.8 12.0  HCT 34.5* 39.3 36.8  PLT 314.0 387 340    COAGS: Recent Labs    01/23/20 1439 10/20/20 0638  INR 1.0 0.9  APTT  --  32    BMP: Recent Labs    10/18/20 1336 10/20/20 0638  NA 138 138  K 4.4 4.1  CL 104 105  CO2 26 27  GLUCOSE 143* 109*  BUN 15 14  CALCIUM 9.2 8.9  CREATININE 1.10* 1.04*  GFRNONAA >60 >60    LIVER FUNCTION TESTS: Recent Labs    04/06/20 1647 06/11/20 1025 09/14/20 0917 10/18/20 1336  BILITOT 0.5 0.5 0.6 0.8  AST 32 25 34 43*  ALT 34 34 37* 38  ALKPHOS 236* 153* 159* 173*  PROT 7.2 7.5 7.6 7.9  ALBUMIN 3.8 3.9 4.2 4.3    TUMOR MARKERS: No results for input(s): AFPTM, CEA, CA199, CHROMGRNA in the last 8760 hours.  Assessment and Plan:  ANYLIA MADSON is a 50 y.o. female with past medical history significant for sleep apnea, hypertension, previous Whipple for benign lesion, and bilateral renal cell carcinomas, post bilateral partial nephrectomies (right robotic partial nephrectomy on 01/08/2012; left robotic partial nephrectomy on 10/02/2013 (both with pathology c/w grade II-III; clear cell RCC) who underwent a technically successful right-sided renal cryoablation on 09/09/2020 (note, biopsy was not performed at the time of the ablation given small size of the lesion).    The following examinations were reviewed in  detail: Abdominal MRI-09/09/2020. CT-guided right renal cryoablation-10/20/2020  Images demonstrate the cryoablation site encompassing the entirety of the partially exophytic lesion arising from the posterior interpolar aspect of the right kidney (series 11, series 12).  I explained to the patient that the initial postprocedural surveillance MRI was performed in 3 months following the cryoablation (late September/early October).    I explained that we will perform surveillance abdominal MRIs for the first 3 months for the first year then we will consider extending the surveillance interval to 6 months though given her history of multiple renal lesions, we may consider continuing on a q 66-monthinterval as ultimately deemed appropriate.  The patient demonstrated excellent understanding and is agreement with the proposed plan of care.  She knows to call the interventional radiology clinic with any interval questions or concerns.  Plan:  - Telemedicine consultation following acquisition of postprocedural MRI to be performed in late September/early October 2022.  A copy of this report was sent to the requesting provider on this date.  Electronically Signed: Sandi Mariscal 12/16/2020, 3:04 PM   I spent a total of 10 Minutes in remote  clinical consultation, greater than 50% of which was counseling/coordinating care for post right sided renal cryoablation.    Visit type: Audio only (telephone). Audio (no video) only due to patient's lack of internet/smartphone capability. Alternative for in-person consultation at [facility], [place] [address], [place], [place]. This visit type was conducted due to national recommendations for restrictions regarding the COVID-19 Pandemic (e.g. social distancing).  This format is felt to be most appropriate for this patient at this time.  All issues noted in this document were discussed and addressed.

## 2021-01-17 ENCOUNTER — Other Ambulatory Visit: Payer: Self-pay

## 2021-01-17 ENCOUNTER — Other Ambulatory Visit: Payer: Self-pay | Admitting: Interventional Radiology

## 2021-01-17 DIAGNOSIS — N2889 Other specified disorders of kidney and ureter: Secondary | ICD-10-CM

## 2021-01-28 ENCOUNTER — Ambulatory Visit (HOSPITAL_COMMUNITY)
Admission: RE | Admit: 2021-01-28 | Discharge: 2021-01-28 | Disposition: A | Discharge status: Home / Self Care | Payer: BC Managed Care – PPO | Source: Ambulatory Visit | Attending: Interventional Radiology | Admitting: Interventional Radiology

## 2021-01-28 ENCOUNTER — Other Ambulatory Visit: Payer: Self-pay

## 2021-01-28 ENCOUNTER — Encounter (HOSPITAL_COMMUNITY): Payer: Self-pay

## 2021-01-28 ENCOUNTER — Ambulatory Visit (HOSPITAL_COMMUNITY): Admission: RE | Admit: 2021-01-28 | Payer: BC Managed Care – PPO | Source: Ambulatory Visit

## 2021-01-28 DIAGNOSIS — N2889 Other specified disorders of kidney and ureter: Secondary | ICD-10-CM | POA: Insufficient documentation

## 2021-01-28 MED ORDER — GADOBUTROL 1 MMOL/ML IV SOLN
10.0000 mL | Freq: Once | INTRAVENOUS | Status: AC | PRN
  Administered 2021-01-28: 10 mL via INTRAVENOUS

## 2021-02-03 ENCOUNTER — Other Ambulatory Visit: Payer: Self-pay

## 2021-02-03 ENCOUNTER — Ambulatory Visit
Admission: RE | Admit: 2021-02-03 | Discharge: 2021-02-03 | Disposition: A | Discharge status: Home / Self Care | Payer: BC Managed Care – PPO | Source: Ambulatory Visit | Attending: Interventional Radiology | Admitting: Interventional Radiology

## 2021-02-03 DIAGNOSIS — N2889 Other specified disorders of kidney and ureter: Secondary | ICD-10-CM

## 2021-02-03 HISTORY — PX: IR RADIOLOGIST EVAL & MGMT: IMG5224

## 2021-02-03 NOTE — Progress Notes (Signed)
Patient ID: Krystal Reynolds, female   DOB: [birth year], 50 y.o.   MRN: [id number]        Chief Complaint: Right sided renal lesion c/w RCC   Referring Physician(s): Borden,Lester   History of Present Illness:   Krystal Reynolds is a 50 y.o. female with past medical history significant for sleep apnea, hypertension, previous Whipple (for a benign lesion), and bilateral renal cell carcinomas (VHL testing previously has been negative), post bilateral partial nephrectomies (right robotic partial nephrectomy on 01/08/2012; left robotic partial nephrectomy on 10/02/2013 - both with pathology c/w grade II-III; clear cell RCC) who underwent a technically successful right-sided renal cryoablation on 09/09/2020 (note, biopsy was not performed at the time of the ablation given small size of the lesion).  The patient is seen today in telemedicine consultation for evaluation and management following acquisition of postprocedural abdominal MRI performed 01/28/2021.   She is again without complaint.  Specifically, she denied any hematuria or flank pain.     Past Medical History:  Diagnosis Date   Chronic kidney disease 01/02/2012   dx. rt. kidney neoplasm-surgery planned, past hx. protein in urine.   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 episodes of pitting edema lower extremities at end of day.   Preeclampsia 1998   Resolved   Pregnancy induced hypertension 1998   Resolved   Sleep apnea    "dx'd; used mask for ~ 1 yr; lost weight; no longer wear mask" (12/21/2016)   Thyroid enlargement 01/02/2012   left thyroid enlargement, being watched, S/P [place]; no tx.    Past Surgical History:  Procedure Laterality Date   BILIARY DILATION  05/05/2019   Procedure: BILIARY DILATION;  Surgeon: Mansouraty, Telford Nab.,  MD;  Location: [place];  Service: Gastroenterology;;   BILIARY DILATION  10/20/2019   Procedure: BILIARY DILATION;  Surgeon: Irving Copas., MD;  Location: [place];  Service: Gastroenterology;;   BILIARY DILATION  03/22/2020   Procedure: BILIARY DILATION;  Surgeon: Irving Copas., MD;  Location: [place];  Service: Gastroenterology;;   BILIARY STENT PLACEMENT  05/05/2019   Procedure: BILIARY STENT PLACEMENT;  Surgeon: Irving Copas., MD;  Location: [place];  Service: Gastroenterology;;   BILIARY STENT PLACEMENT  10/20/2019   Procedure: BILIARY STENT PLACEMENT;  Surgeon: Irving Copas., MD;  Location: [place];  Service: Gastroenterology;;   Rosanne Gutting PYLORI  09/20/2011   Procedure: BREATH TEK H PYLORI;  Surgeon: Shann Medal, MD;  [facility];  Service: General;  Laterality: N/A;   childbirth  01-02-12   x2 NVD- pre-exclampsia with pregrancy   CHOLECYSTECTOMY  11/23/2016   'w/whipple"   ENDOSCOPIC RETROGRADE CHOLANGIOPANCREATOGRAPHY (ERCP) WITH PROPOFOL N/A 05/05/2019   Procedure: ENDOSCOPIC RETROGRADE CHOLANGIOPANCREATOGRAPHY (ERCP) WITH PROPOFOL;  Surgeon: Irving Copas., MD;  Location: [place];  Service: Gastroenterology;  Laterality: N/A;   ENDOSCOPIC RETROGRADE CHOLANGIOPANCREATOGRAPHY (ERCP) WITH PROPOFOL N/A 10/20/2019   Procedure: ENDOSCOPIC RETROGRADE CHOLANGIOPANCREATOGRAPHY (ERCP) WITH PROPOFOL;  Surgeon: Rush Landmark Telford Nab., MD;  Location: [place];  Service: Gastroenterology;  Laterality: N/A;   ENDOSCOPIC RETROGRADE CHOLANGIOPANCREATOGRAPHY (ERCP) WITH PROPOFOL N/A 03/22/2020   Procedure: ENDOSCOPIC RETROGRADE CHOLANGIOPANCREATOGRAPHY (ERCP) WITH PROPOFOL;  Surgeon: Rush Landmark Telford Nab., MD;  Location: [place];  Service: Gastroenterology;  Laterality: N/A;  ESOPHAGOGASTRODUODENOSCOPY (EGD) WITH PROPOFOL N/A 05/05/2019   Procedure: ESOPHAGOGASTRODUODENOSCOPY (EGD) WITH PROPOFOL;  Surgeon:  Rush Landmark Telford Nab., MD;  Location: [place];  Service: Gastroenterology;  Laterality: N/A;   EUS  12/07/2011   Procedure: UPPER ENDOSCOPIC ULTRASOUND (EUS) LINEAR;  Surgeon: Milus Banister, MD;  [facility];  Service: Endoscopy;  Laterality: N/A;  radial linear   IR RADIOLOGIST EVAL & MGMT  01/11/2017   IR RADIOLOGIST EVAL & MGMT  09/30/2020   IR RADIOLOGIST EVAL & MGMT  12/16/2020   LIVER BIOPSY  12/2018   RADIOLOGY WITH ANESTHESIA N/A 10/20/2020   Procedure: IR WITH ANESTHESIA CRYOABLATION;  Surgeon: Sandi Mariscal, MD;  [facility];  Service: Anesthesiology;  Laterality: N/A;   REMOVAL OF STONES  05/05/2019   Procedure: REMOVAL OF STONES;  Surgeon: Rush Landmark Telford Nab., MD;  Location: [place];  Service: Gastroenterology;;   REMOVAL OF STONES  10/20/2019   Procedure: REMOVAL OF STONES;  Surgeon: Irving Copas., MD;  Location: [place];  Service: Gastroenterology;;   REMOVAL OF STONES  03/22/2020   Procedure: REMOVAL OF STONES;  Surgeon: Irving Copas., MD;  Location: [place];  Service: Gastroenterology;;   ROBOTIC ASSITED PARTIAL NEPHRECTOMY Left 10/02/2013   Procedure: ROBOTIC ASSITED PARTIAL NEPHRECTOMY;  Surgeon: Raynelle Bring, MD;  [facility];  Service: Urology;  Laterality: Left;   ROBOTIC ASSITED PARTIAL NEPHRECTOMY Right 12/2011   STENT REMOVAL  10/20/2019   Procedure: STENT REMOVAL;  Surgeon: Irving Copas., MD;  Location: [place];  Service: Gastroenterology;;   Lavell Islam REMOVAL  03/22/2020   Procedure: STENT REMOVAL;  Surgeon: Irving Copas., MD;  Location: [place];  Service: Gastroenterology;;   SUBMUCOSAL TATTOO INJECTION  05/05/2019   Procedure: SUBMUCOSAL TATTOO INJECTION;  Surgeon: Irving Copas., MD;  Location: [place];  Service: Gastroenterology;;   VAGINAL HYSTERECTOMY  2008   WHIPPLE PROCEDURE N/A 11/23/2016   Procedure: WHIPPLE PROCEDURE;  Surgeon: Stark Klein, MD;  Location: [place];   Service: General;  Laterality: N/A;    Allergies: Patient has no known allergies.  Medications: Prior to Admission medications   Medication Sig Start Date End Date Taking? Authorizing Provider  cetirizine (ZYRTEC) 10 MG tablet Take 10 mg by mouth daily as needed for allergies.    [provider]  citalopram (CELEXA) 20 MG tablet Take 20 mg by mouth daily with breakfast.     [provider]  naproxen sodium (ALEVE) 220 MG tablet Take 440 mg by mouth 2 (two) times daily as needed (pain).    [provider]     Family History  Problem Relation Age of Onset   Cancer Paternal Uncle        lung   AAA (abdominal aortic aneurysm) Maternal Grandfather        60s-70s   Colon cancer Neg Hx    Esophageal cancer Neg Hx    Inflammatory bowel disease Neg Hx    Liver disease Neg Hx    Pancreatic cancer Neg Hx    Rectal cancer Neg Hx    Stomach cancer Neg Hx     Social History   Socioeconomic History   Marital status: Married    Spouse name: Not on file   Number of children: 2   Years of education: Not on file   Highest education level: Not on file  Occupational History   Occupation: teacher    Employer: [employer]  Tobacco Use   Smoking status: Never  Smokeless tobacco: Never  Vaping Use   Vaping Use: Never used  Substance and Sexual Activity   Alcohol use: Yes    Comment:  "beer twice/month; if that"   Drug use: No   Sexual activity: Not Currently    Birth control/protection: Surgical    Comment: Hysterectomy  Other Topics Concern   Not on file  Social History Narrative   Not on file   Social Determinants of Health   Financial Resource Strain: Not on file  Food Insecurity: Not on file  Transportation Needs: Not on file  Physical Activity: Not on file  Stress: Not on file  Social Connections: Not on file    ECOG Status: 0 - Asymptomatic  Review of Systems  Review of Systems: A 12 point ROS discussed and pertinent  positives are indicated in the HPI above.  All other systems are negative.  Physical Exam No direct physical exam was performed (except for noted visual exam findings with Video Visits).   Vital Signs: LMP  (LMP Unknown)   Imaging:  The following examinations were reviewed in detail: Abdominal MRI - 09/09/2020; 01/28/2021 Image guided right renal cryoablation - 10/20/2020  MR ABDOMEN W WO CONTRAST  Result Date: 01/30/2021 CLINICAL DATA:  Follow-up renal cell carcinoma. 3 months status post cryoablation. Previous partial right nephrectomy. Prior Whipple procedure. EXAM: MRI ABDOMEN WITHOUT AND WITH CONTRAST TECHNIQUE: Multiplanar multisequence MR imaging of the abdomen was performed both before and after the administration of intravenous contrast. CONTRAST:  47mL GADAVIST GADOBUTROL 1 MMOL/ML IV SOLN COMPARISON:  09/09/2020 FINDINGS: Lower chest: No acute findings. Hepatobiliary: No hepatic masses identified. Increased diffuse hepatic steatosis is noted. Prior cholecystectomy. No evidence of biliary obstruction. Pancreas: Stable postop changes from Whipple procedure. Mild diffuse pancreatic ductal dilatation is stable, but no masses identified. No evidence of peripancreatic inflammatory changes or fluid collections. Spleen:  Within normal limits in size and appearance. Adrenals/Urinary Tract: Normal adrenal glands. Previous bilateral partial nephrectomies are again seen. A cryoablation defect is now seen in the posterior midpole of the right kidney, and the previously seen mass at this location is no longer visualized. No evidence of hydronephrosis. Stable small benign left renal cysts, without evidence of left renal mass. Stomach/Bowel: A small paraumbilical ventral hernia is again seen containing a loop of transverse colon and small bowel. Otherwise unremarkable. Vascular/Lymphatic: No pathologically enlarged lymph nodes identified. No acute vascular findings. Other:  None. Musculoskeletal:  No  suspicious bone lesions identified. IMPRESSION: Cryoablation defect in the posterior midpole of right kidney. No evidence of residual renal mass or abdominal metastatic disease. Stable postop changes from Whipple procedure. Increased diffuse hepatic steatosis. Stable small paraumbilical ventral hernia. Electronically Signed   By: Marlaine Hind M.D.   On: 01/30/2021 21:41    Labs:  CBC: Recent Labs    10/18/20 1336 10/20/20 0638  WBC 7.3 6.7  HGB 12.8 12.0  HCT 39.3 36.8  PLT 387 340    COAGS: Recent Labs    10/20/20 0638  INR 0.9  APTT 32    BMP: Recent Labs    10/18/20 1336 10/20/20 0638  NA 138 138  K 4.4 4.1  CL 104 105  CO2 26 27  GLUCOSE 143* 109*  BUN 15 14  CALCIUM 9.2 8.9  CREATININE 1.10* 1.04*  GFRNONAA >60 >60    LIVER FUNCTION TESTS: Recent Labs    04/06/20 1647 06/11/20 1025 09/14/20 0917 10/18/20 1336  BILITOT 0.5 0.5 0.6 0.8  AST 32 25 34 43*  ALT 34 34 37* 38  ALKPHOS 236* 153* 159* 173*  PROT 7.2 7.5 7.6 7.9  ALBUMIN 3.8 3.9 4.2 4.3    TUMOR MARKERS: No results for input(s): AFPTM, CEA, CA199, CHROMGRNA in the last 8760 hours.  Assessment and Plan:    ANSLEIGH SAFER is a 50 y.o. female with past medical history significant for sleep apnea, hypertension, previous Whipple (for a benign lesion), and bilateral renal cell carcinomas (VHL testing previously has been negative), post bilateral partial nephrectomies (right robotic partial nephrectomy on 01/08/2012; left robotic partial nephrectomy on 10/02/2013 - both with pathology c/w grade II-III; clear cell RCC) who underwent a technically successful right-sided renal cryoablation on 09/09/2020 (note, biopsy was not performed at the time of the ablation given small size of the lesion).  The patient is seen today in telemedicine consultation for evaluation and management following acquisition of postprocedural abdominal MRI performed 01/28/2021.   She is again without complaint.   The following  examinations were reviewed in detail: Abdominal MRI - 09/09/2020; 01/28/2021 Image guided right renal cryoablation - 10/20/2020  Postprocedural abdominal MRI performed 01/28/2021 demonstrates a technically excellent result without evidence of residual nodular enhancing tissue to suggest residual or locally recurrent disease.  No additional worrisome renal lesions are identified.  No evidence of complication, specifically, no evidence of hematoma or urine leak.  Initial postprocedural MRI suggests a technically successful intervention on 10/20/2020.    Given patient's history of bilateral renal lesions as well as her young age, we will pursue active surveillance with abdominal MRIs every 3 months for the first year, then every 6 months for the following year and a final surveillance scan performed 3 years after the intervention (June 2025).  The patient demonstrated excellent understanding of the above conversation and is agreement with the plan of care.  Plan:  - Telemedicine consultation following acquisition of next surveillance abdominal MRI performed January 2023.  A copy of this report was sent to the requesting provider on this date.  Electronically Signed: Sandi Mariscal 02/03/2021, 11:43 AM   I spent a total of 10 Minutes in remote clinical consultation, greater than 50% of which was counseling/coordinating care for post .    Visit type: Audio only (telephone). Audio (no video) only due to patient's lack of internet/smartphone capability. Alternative for in-person consultation at [facility] [place], [address] [place], [place], [place]. This visit type was conducted due to national recommendations for restrictions regarding the COVID-19 Pandemic (e.g. social distancing).  This format is felt to be most appropriate for this patient at this time.  All issues noted in this document were discussed and addressed.

## 2021-02-04 ENCOUNTER — Encounter: Payer: Self-pay | Admitting: *Deleted

## 2021-07-06 ENCOUNTER — Other Ambulatory Visit: Payer: Self-pay

## 2021-07-06 ENCOUNTER — Telehealth: Payer: Self-pay | Admitting: Gastroenterology

## 2021-07-06 ENCOUNTER — Other Ambulatory Visit (INDEPENDENT_AMBULATORY_CARE_PROVIDER_SITE_OTHER): Payer: BC Managed Care – PPO

## 2021-07-06 DIAGNOSIS — R197 Diarrhea, unspecified: Secondary | ICD-10-CM

## 2021-07-06 LAB — COMPREHENSIVE METABOLIC PANEL
ALT: 37 U/L — ABNORMAL HIGH (ref 0–35)
AST: 48 U/L — ABNORMAL HIGH (ref 0–37)
Albumin: 4 g/dL (ref 3.5–5.2)
Alkaline Phosphatase: 215 U/L — ABNORMAL HIGH (ref 39–117)
BUN: 8 mg/dL (ref 6–23)
CO2: 29 mEq/L (ref 19–32)
Calcium: 8.9 mg/dL (ref 8.4–10.5)
Chloride: 102 mEq/L (ref 96–112)
Creatinine, Ser: 1.04 mg/dL (ref 0.40–1.20)
GFR: 63.05 mL/min (ref >60.00)
Glucose, Bld: 70 mg/dL (ref 70–99)
Potassium: 4.3 mEq/L (ref 3.5–5.1)
Sodium: 136 mEq/L (ref 135–145)
Total Bilirubin: 0.5 mg/dL (ref 0.2–1.2)
Total Protein: 7.3 g/dL (ref 6.0–8.3)

## 2021-07-06 LAB — CBC WITH DIFFERENTIAL/PLATELET
Basophils Absolute: 0.1 10*3/uL (ref 0.0–0.1)
Basophils Relative: 1.1 % (ref 0.0–3.0)
Eosinophils Absolute: 0.1 10*3/uL (ref 0.0–0.7)
Eosinophils Relative: 1.4 % (ref 0.0–5.0)
HCT: 36 % (ref 36.0–46.0)
Hemoglobin: 12 g/dL (ref 12.0–15.0)
Lymphocytes Relative: 18.8 % (ref 12.0–46.0)
Lymphs Abs: 1.8 10*3/uL (ref 0.7–4.0)
MCHC: 33.4 g/dL (ref 30.0–36.0)
MCV: 83.1 fl (ref 78.0–100.0)
Monocytes Absolute: 0.7 10*3/uL (ref 0.1–1.0)
Monocytes Relative: 6.9 % (ref 3.0–12.0)
Neutro Abs: 6.8 10*3/uL (ref 1.4–7.7)
Neutrophils Relative %: 71.8 % (ref 43.0–77.0)
Platelets: 341 10*3/uL (ref 150.0–400.0)
RBC: 4.33 Mil/uL (ref 3.87–5.11)
RDW: 15 % (ref 11.5–15.5)
WBC: 9.5 10*3/uL (ref 4.0–10.5)

## 2021-07-06 LAB — TSH: TSH: 1.69 u[IU]/mL (ref 0.35–5.50)

## 2021-07-06 NOTE — Telephone Encounter (Signed)
Spoke with patient regarding recommendations. Follow up scheduled with Krystal Reynolds, [place] on 07/19/21 at 9:15 am. She is aware that she will need to come in for lab work & stool study prior to her appointment.  ?

## 2021-07-06 NOTE — Telephone Encounter (Signed)
Left message for patient to call back  

## 2021-07-06 NOTE — Telephone Encounter (Signed)
Patient called in with complaints of loose, greasy stools for the last couple of months. She is going several times during the day. It is difficult for her to leave the house with this issue, and would like MD recommendations. Patient was last seen for an OV on 06/11/20 with Dr. Ardis Hughs. ? ?Dr. Ardis Hughs, please advise. Thank you.  ?

## 2021-07-06 NOTE — Telephone Encounter (Signed)
"  I am having issues with my tummy. Not to be too gross, but I have to go to the bathroom almost immediately after eating about 80% of the time... and it's usually loose and seems greasy. I probably go to the bathroom like this five to six times a day" ?

## 2021-07-07 ENCOUNTER — Other Ambulatory Visit: Payer: Self-pay

## 2021-07-07 DIAGNOSIS — A048 Other specified bacterial intestinal infections: Secondary | ICD-10-CM

## 2021-07-07 DIAGNOSIS — R197 Diarrhea, unspecified: Secondary | ICD-10-CM

## 2021-07-08 ENCOUNTER — Other Ambulatory Visit: Payer: BC Managed Care – PPO

## 2021-07-08 DIAGNOSIS — R197 Diarrhea, unspecified: Secondary | ICD-10-CM

## 2021-07-08 DIAGNOSIS — A048 Other specified bacterial intestinal infections: Secondary | ICD-10-CM

## 2021-07-12 ENCOUNTER — Other Ambulatory Visit: Payer: BC Managed Care – PPO

## 2021-07-12 ENCOUNTER — Other Ambulatory Visit: Payer: Self-pay

## 2021-07-12 DIAGNOSIS — R7989 Other specified abnormal findings of blood chemistry: Secondary | ICD-10-CM

## 2021-07-12 LAB — GI PROFILE, STOOL, PCR

## 2021-07-14 ENCOUNTER — Other Ambulatory Visit: Payer: BC Managed Care – PPO

## 2021-07-14 DIAGNOSIS — R7989 Other specified abnormal findings of blood chemistry: Secondary | ICD-10-CM

## 2021-07-15 LAB — PANCREATIC ELASTASE, FECAL: Pancreatic Elastase-1, Stool: <15 mcg/g — ABNORMAL LOW

## 2021-07-18 ENCOUNTER — Other Ambulatory Visit: Payer: Self-pay

## 2021-07-18 DIAGNOSIS — R7989 Other specified abnormal findings of blood chemistry: Secondary | ICD-10-CM

## 2021-07-18 MED ORDER — PANCRELIPASE (LIP-PROT-AMYL) 36000-114000 UNITS PO CPEP: 36000.0000 [IU] | ORAL_CAPSULE | Freq: Three times a day (TID) | ORAL | 11 refills | Status: DC

## 2021-07-19 ENCOUNTER — Ambulatory Visit (INDEPENDENT_AMBULATORY_CARE_PROVIDER_SITE_OTHER): Payer: BC Managed Care – PPO | Admitting: Gastroenterology

## 2021-07-19 ENCOUNTER — Encounter: Payer: Self-pay | Admitting: Gastroenterology

## 2021-07-19 VITALS — BP 160/90 | HR 61 | Ht 67.0 in | Wt 223.4 lb

## 2021-07-19 DIAGNOSIS — R197 Diarrhea, unspecified: Secondary | ICD-10-CM | POA: Insufficient documentation

## 2021-07-19 DIAGNOSIS — R7989 Other specified abnormal findings of blood chemistry: Secondary | ICD-10-CM

## 2021-07-19 DIAGNOSIS — K8681 Exocrine pancreatic insufficiency: Secondary | ICD-10-CM | POA: Diagnosis not present

## 2021-07-19 MED ORDER — PANCRELIPASE (LIP-PROT-AMYL) 36000-114000 UNITS PO CPEP: ORAL_CAPSULE | ORAL | 3 refills | Status: DC

## 2021-07-19 NOTE — Progress Notes (Signed)
I agree with the above note, plan 

## 2021-07-19 NOTE — Patient Instructions (Signed)
We have sent the following medications to your pharmacy for you to pick up at your convenience: ?Creon - 2 capsules with meals and 1 capsule with snacks.  ? ?Call or send mychart message in a few weeks with update.  ? ?If you are age 50 or older, your body mass index should be between 23-30. Your Body mass index is 34.99 kg/m?Marland Kitchen If this is out of the aforementioned range listed, please consider follow up with your Primary Care Provider. ? ?If you are age 57 or younger, your body mass index should be between 19-25. Your Body mass index is 34.99 kg/m?Marland Kitchen If this is out of the aformentioned range listed, please consider follow up with your Primary Care Provider.  ? ?________________________________________________________ ? ?The [facility] providers would like to encourage you to use [facility] to communicate with providers for non-urgent requests or questions.  Due to long hold times on the telephone, sending your provider a message by [facility] may be a faster and more efficient way to get a response.  Please allow 48 business hours for a response.  Please remember that this is for non-urgent requests.  ?_______________________________________________________ ? ?

## 2021-07-19 NOTE — Progress Notes (Signed)
? ? ? ?07/19/2021 ?Krystal Reynolds ?[id number] ?1970-06-06 ? ?Review of pertinent gastrointestinal problems: ?1. Incidental pancreatic cyst:  cystic lesion pancreatic head noted 09/2011; EUS 4.1cm multiloculated without concerning mophrology; Cytology negative CEA less than 0.2ng/mL Amylase 42 U/L;  ?EUS 09/2011 1mm max ?MRI 01/2013: 50mm by 33mm;  ?MRI 07/2013 18mm by 26mm.   ?CT 11/2014: 32mm by 62mm by 83mm.  ?CT 12/2015: 39mm by 38mm by 70mm.   ?MRI 03/2016 49 mm x 35 mm x 41 mm. ?Whipple surgery 2018.  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  Complicated by postoperative chronic elevated liver tests, alk phos and potential secondary sclerosing cirrhosis.  Eventual ERCP January 2021, June 2021 and then November 2021 all with Dr. Rush Landmark ? ?HISTORY OF PRESENT ILLNESS: This is a 51 year old female is a patient of Dr. Ardis Hughs.  She had a serous cystadenoma that was followed for years, but then removed via Whipple in 2018.  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  LFTs were checked recently.  They are fairly stable, but alk phos bumped up slightly.  Dr. Ardis Hughs wants labs repeated again in a few weeks.  She has also been having complaints of diarrhea several times per day that is oily and greasy in nature.  Pancreatic fecal elastase level was <15.  Started on Creon yesterday. ? ? ?Past Medical History:  ?Diagnosis Date  ? Chronic kidney disease 01/02/2012  ? dx. rt. kidney neoplasm-surgery planned, past hx. protein in urine.  ? 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 clear cell renal cell carcinoma-bilateral cyst removed 15-20% of kidneys  ? Depression   ? Family history of anesthesia complication   ? mother slow to wake up   ? Leg swelling 01/02/2012  ? occ. episodes of pitting edema lower extremities at end of day.  ? Preeclampsia 1998  ? Resolved  ? Pregnancy induced hypertension 1998  ? Resolved  ? Sleep apnea   ? "dx'd; used mask for ~ 1 yr;  lost weight; no longer wear mask" (12/21/2016)  ? Thyroid enlargement 01/02/2012  ? left thyroid enlargement, being watched, S/P [place]; no tx.  ? ?Past Surgical History:  ?Procedure Laterality Date  ? BILIARY DILATION  05/05/2019  ? Procedure: BILIARY DILATION;  Surgeon: Rush Landmark Telford Nab., MD;  Location: [place];  Service: Gastroenterology;;  ? BILIARY DILATION  10/20/2019  ? Procedure: BILIARY DILATION;  Surgeon: Rush Landmark Telford Nab., MD;  Location: [place];  Service: Gastroenterology;;  ? BILIARY DILATION  03/22/2020  ? Procedure: BILIARY DILATION;  Surgeon: Rush Landmark Telford Nab., MD;  Location: [place];  Service: Gastroenterology;;  ? BILIARY STENT PLACEMENT  05/05/2019  ? Procedure: BILIARY STENT PLACEMENT;  Surgeon: Rush Landmark Telford Nab., MD;  Location: [place];  Service: Gastroenterology;;  ? BILIARY STENT PLACEMENT  10/20/2019  ? Procedure: BILIARY STENT PLACEMENT;  Surgeon: Rush Landmark Telford Nab., MD;  Location: [place];  Service: Gastroenterology;;  ? [place]  09/20/2011  ? Procedure: BREATH TEK H PYLORI;  Surgeon: Shann Medal, MD;  Location: [facility];  Service: General;  Laterality: N/A;  ? childbirth  01-02-12  ? x2 NVD- pre-exclampsia with pregrancy  ? CHOLECYSTECTOMY  11/23/2016  ? 'w/whipple"  ? ENDOSCOPIC RETROGRADE CHOLANGIOPANCREATOGRAPHY (ERCP) WITH PROPOFOL N/A 05/05/2019  ? Procedure: ENDOSCOPIC RETROGRADE CHOLANGIOPANCREATOGRAPHY (ERCP) WITH PROPOFOL;  Surgeon: Rush Landmark Telford Nab., MD;  Location: [place];  Service: Gastroenterology;  Laterality: N/A;  ? ENDOSCOPIC RETROGRADE CHOLANGIOPANCREATOGRAPHY (ERCP) WITH PROPOFOL  N/A 10/20/2019  ? Procedure: ENDOSCOPIC RETROGRADE CHOLANGIOPANCREATOGRAPHY (ERCP) WITH PROPOFOL;  Surgeon: Rush Landmark Telford Nab., MD;  Location: [place];  Service: Gastroenterology;  Laterality: N/A;  ? ENDOSCOPIC RETROGRADE CHOLANGIOPANCREATOGRAPHY (ERCP) WITH PROPOFOL N/A 03/22/2020  ? Procedure: ENDOSCOPIC RETROGRADE  CHOLANGIOPANCREATOGRAPHY (ERCP) WITH PROPOFOL;  Surgeon: Rush Landmark Telford Nab., MD;  Location: [place];  Service: Gastroenterology;  Laterality: N/A;  ? ESOPHAGOGASTRODUODENOSCOPY (EGD) WITH PROPOFOL N/A 05/05/2019  ? Procedure: ESOPHAGOGASTRODUODENOSCOPY (EGD) WITH PROPOFOL;  Surgeon: Rush Landmark Telford Nab., MD;  Location: [place];  Service: Gastroenterology;  Laterality: N/A;  ? EUS  12/07/2011  ? Procedure: UPPER ENDOSCOPIC ULTRASOUND (EUS) LINEAR;  Surgeon: Milus Banister, MD;  [facility];  Service: Endoscopy;  Laterality: N/A;  radial linear  ? IR RADIOLOGIST EVAL & MGMT  01/11/2017  ? IR RADIOLOGIST EVAL & MGMT  09/30/2020  ? IR RADIOLOGIST EVAL & MGMT  12/16/2020  ? IR RADIOLOGIST EVAL & MGMT  02/03/2021  ? LIVER BIOPSY  12/2018  ? RADIOLOGY WITH ANESTHESIA N/A 10/20/2020  ? Procedure: IR WITH ANESTHESIA CRYOABLATION;  Surgeon: Sandi Mariscal, MD;  [facility];  Service: Anesthesiology;  Laterality: N/A;  ? REMOVAL OF STONES  05/05/2019  ? Procedure: REMOVAL OF STONES;  Surgeon: Rush Landmark Telford Nab., MD;  Location: [place];  Service: Gastroenterology;;  ? REMOVAL OF STONES  10/20/2019  ? Procedure: REMOVAL OF STONES;  Surgeon: Rush Landmark Telford Nab., MD;  Location: [place];  Service: Gastroenterology;;  ? REMOVAL OF STONES  03/22/2020  ? Procedure: REMOVAL OF STONES;  Surgeon: Mansouraty, Telford Nab., MD;  Location: [place];  Service: Gastroenterology;;  ? ROBOTIC ASSITED PARTIAL NEPHRECTOMY Left 10/02/2013  ? Procedure: ROBOTIC ASSITED PARTIAL NEPHRECTOMY;  Surgeon: Raynelle Bring, MD;  [facility];  Service: Urology;  Laterality: Left;  ? ROBOTIC ASSITED PARTIAL NEPHRECTOMY Right 12/2011  ? STENT REMOVAL  10/20/2019  ? Procedure: STENT REMOVAL;  Surgeon: Irving Copas., MD;  Location: [place];  Service: Gastroenterology;;  ? [place]  03/22/2020  ? Procedure: STENT REMOVAL;  Surgeon: Irving Copas., MD;  Location: [place];  Service:  Gastroenterology;;  ? [place] INJECTION  05/05/2019  ? Procedure: SUBMUCOSAL TATTOO INJECTION;  Surgeon: Irving Copas., MD;  Location: [facility];  Service: Gastroenterology;;  ? VAGINAL HYSTERECTOMY  2008  ? WHIPPLE PROCEDURE N/A 11/23/2016  ? Procedure: WHIPPLE PROCEDURE;  Surgeon: Stark Klein, MD;  Location: [place];  Service: General;  Laterality: N/A;  ? ? reports that she has never smoked. She has never used smokeless tobacco. She reports current alcohol use. She reports that she does not use drugs. ?family history includes AAA (abdominal aortic aneurysm) in her maternal grandfather; Cancer in her paternal uncle. ?No Known Allergies ? ?  ?Outpatient Encounter Medications as of 07/19/2021  ?Medication Sig  ? cetirizine (ZYRTEC) 10 MG tablet Take 10 mg by mouth daily as needed for allergies.  ? citalopram (CELEXA) 20 MG tablet Take 20 mg by mouth daily with breakfast.   ? lipase/protease/amylase (CREON) 36000 UNITS CPEP capsule Take 1 capsule (36,000 Units total) by mouth 3 (three) times daily with meals.  ? naproxen sodium (ALEVE) 220 MG tablet Take 440 mg by mouth 2 (two) times daily as needed (pain).  ? ?No facility-administered encounter medications on file as of 07/19/2021.  ? ? ? ?REVIEW OF SYSTEMS  : All other systems reviewed and negative except where noted in the History of Present Illness. ? ? ?PHYSICAL EXAM: ?BP (!) 160/90   Pulse 61  Ht '5\' 7"'$  (1.702 m)   Wt 223 lb 6.4 oz (101.3 kg)   LMP  (LMP Unknown)   BMI 34.99 kg/m?  ?General: Well developed white female in no acute distress ?Head: Normocephalic and atraumatic ?Eyes:  Sclerae anicteric, conjunctiva pink. ?Ears: Normal auditory acuity ?Lungs: Clear throughout to auscultation; no W/R/R. ?Heart: Regular rate and rhythm; no M/R/G. ?Abdomen: Soft, non-distended.  BS present.  Non-tender.  Scars noted on abdomen from previous surgeries. ?Musculoskeletal: Symmetrical with no gross deformities  ?Skin: No lesions on visible  extremities ?Extremities: No edema  ?Neurological: Alert oriented x 4, grossly non-focal ?Psychological:  Alert and cooperative. Normal mood and affect ? ?ASSESSMENT AND PLAN: ?*51 year old female with previous chronic bi

## 2021-07-23 LAB — PANCREATIC ELASTASE, FECAL: Pancreatic Elastase-1, Stool: <15 mcg/g — ABNORMAL LOW

## 2021-09-13 ENCOUNTER — Other Ambulatory Visit (INDEPENDENT_AMBULATORY_CARE_PROVIDER_SITE_OTHER): Payer: BC Managed Care – PPO

## 2021-09-13 DIAGNOSIS — R7989 Other specified abnormal findings of blood chemistry: Secondary | ICD-10-CM | POA: Diagnosis not present

## 2021-09-13 LAB — HEPATIC FUNCTION PANEL
ALT: 32 U/L (ref 0–35)
AST: 30 U/L (ref 0–37)
Albumin: 4.1 g/dL (ref 3.5–5.2)
Alkaline Phosphatase: 169 U/L — ABNORMAL HIGH (ref 39–117)
Bilirubin, Direct: 0.1 mg/dL (ref 0.0–0.3)
Total Bilirubin: 0.5 mg/dL (ref 0.2–1.2)
Total Protein: 7.5 g/dL (ref 6.0–8.3)

## 2021-10-26 ENCOUNTER — Encounter: Payer: Self-pay | Admitting: Gastroenterology

## 2021-11-04 ENCOUNTER — Ambulatory Visit: Payer: BC Managed Care – PPO | Admitting: *Deleted

## 2021-11-04 VITALS — Ht 67.0 in | Wt 220.0 lb

## 2021-11-04 DIAGNOSIS — Z1211 Encounter for screening for malignant neoplasm of colon: Secondary | ICD-10-CM

## 2021-11-04 MED ORDER — NA SULFATE-K SULFATE-MG SULF 17.5-3.13-1.6 GM/177ML PO SOLN: 2.0000 | Freq: Once | ORAL | 0 refills | Status: AC

## 2021-11-04 NOTE — Progress Notes (Signed)

## 2021-11-11 ENCOUNTER — Encounter: Payer: Self-pay | Admitting: Gastroenterology

## 2021-11-14 ENCOUNTER — Encounter: Payer: Self-pay | Admitting: Gastroenterology

## 2021-11-14 DIAGNOSIS — Z1211 Encounter for screening for malignant neoplasm of colon: Secondary | ICD-10-CM

## 2021-11-14 MED ORDER — NA SULFATE-K SULFATE-MG SULF 17.5-3.13-1.6 GM/177ML PO SOLN: 1.0000 | ORAL | 0 refills | Status: DC

## 2021-11-23 ENCOUNTER — Ambulatory Visit (HOSPITAL_COMMUNITY)
Admission: RE | Admit: 2021-11-23 | Discharge: 2021-11-23 | Disposition: A | Discharge status: Home / Self Care | Payer: BC Managed Care – PPO | Source: Ambulatory Visit | Attending: Urology | Admitting: Urology

## 2021-11-23 ENCOUNTER — Ambulatory Visit (AMBULATORY_SURGERY_CENTER): Payer: BC Managed Care – PPO | Admitting: Gastroenterology

## 2021-11-23 ENCOUNTER — Other Ambulatory Visit (HOSPITAL_COMMUNITY): Payer: Self-pay | Admitting: Urology

## 2021-11-23 ENCOUNTER — Encounter: Payer: Self-pay | Admitting: Gastroenterology

## 2021-11-23 VITALS — BP 125/56 | HR 48 | Temp 97.8°F | Resp 14 | Ht 67.0 in | Wt 220.0 lb

## 2021-11-23 DIAGNOSIS — Z1211 Encounter for screening for malignant neoplasm of colon: Secondary | ICD-10-CM

## 2021-11-23 DIAGNOSIS — C642 Malignant neoplasm of left kidney, except renal pelvis: Secondary | ICD-10-CM

## 2021-11-23 MED ORDER — SODIUM CHLORIDE 0.9 % IV SOLN: 500.0000 mL | Freq: Once | INTRAVENOUS | Status: DC

## 2021-11-23 NOTE — Patient Instructions (Signed)
YOU HAD AN ENDOSCOPIC PROCEDURE TODAY AT THE [facility]:   Refer to the procedure report that was given to you for any specific questions about what was found during the examination.  If the procedure report does not answer your questions, please call your gastroenterologist to clarify.  If you requested that your care partner not be given the details of your procedure findings, then the procedure report has been included in a sealed envelope for you to review at your convenience later.  YOU SHOULD EXPECT: Some feelings of bloating in the abdomen. Passage of more gas than usual.  Walking can help get rid of the air that was put into your GI tract during the procedure and reduce the bloating. If you had a lower endoscopy (such as a colonoscopy or flexible sigmoidoscopy) you may notice spotting of blood in your stool or on the toilet paper. If you underwent a bowel prep for your procedure, you may not have a normal bowel movement for a few days.  Please Note:  You might notice some irritation and congestion in your nose or some drainage.  This is from the oxygen used during your procedure.  There is no need for concern and it should clear up in a day or so.  SYMPTOMS TO REPORT IMMEDIATELY:  Following lower endoscopy (colonoscopy or flexible sigmoidoscopy):  Excessive amounts of blood in the stool  Significant tenderness or worsening of abdominal pains  Swelling of the abdomen that is new, acute  Fever of 100F or higher  Following upper endoscopy (EGD)  Vomiting of blood or coffee ground material  New chest pain or pain under the shoulder blades  Painful or persistently difficult swallowing  New shortness of breath  Fever of 100F or higher  Black, tarry-looking stools  For urgent or emergent issues, a gastroenterologist can be reached at any hour by calling [phone]. Do not use MyChart messaging for urgent concerns.    DIET:  We do recommend a small meal at first, but  then you may proceed to your regular diet.  Drink plenty of fluids but you should avoid alcoholic beverages for 24 hours.  ACTIVITY:  You should plan to take it easy for the rest of today and you should NOT DRIVE or use heavy machinery until tomorrow (because of the sedation medicines used during the test).    FOLLOW UP: Our staff will call the number listed on your records the next business day following your procedure.  We will call around 7:15- 8:00 am to check on you and address any questions or concerns that you may have regarding the information given to you following your procedure. If we do not reach you, we will leave a message.  If you develop any symptoms (ie: fever, flu-like symptoms, shortness of breath, cough etc.) before then, please call [phone].  If you test positive for Covid 19 in the 2 weeks post procedure, please call and report this information to us.    If any biopsies were taken you will be contacted by phone or by letter within the next 1-3 weeks.  Please call us at [phone] if you have not heard about the biopsies in 3 weeks.    SIGNATURES/CONFIDENTIALITY: You and/or your care partner have signed paperwork which will be entered into your electronic medical record.  These signatures attest to the fact that that the information above on your After Visit Summary has been reviewed and is understood.  Full responsibility of the confidentiality   of this discharge information lies with you and/or your care-partner.  

## 2021-11-23 NOTE — Progress Notes (Signed)
Pt's states no medical or surgical changes since previsit or office visit. 

## 2021-11-23 NOTE — Op Note (Signed)
[facility] Patient Name: Krystal Reynolds Procedure Date: 11/23/2021 3:01 PM MRN: [id number] Endoscopist: Milus Banister , MD Age: 51 Referring MD:  Date of Birth: 03/19/1971 Gender: Female Account #: [id number] Procedure:                Colonoscopy Indications:              Screening for colorectal malignant neoplasm Medicines:                Monitored Anesthesia Care Procedure:                Pre-Anesthesia Assessment:                           - Prior to the procedure, a History and Physical                            was performed, and patient medications and                            allergies were reviewed. The patient's tolerance of                            previous anesthesia was also reviewed. The risks                            and benefits of the procedure and the sedation                            options and risks were discussed with the patient.                            All questions were answered, and informed consent                            was obtained. Prior Anticoagulants: The patient has                            taken no previous anticoagulant or antiplatelet                            agents. ASA Grade Assessment: II - A patient with                            mild systemic disease. After reviewing the risks                            and benefits, the patient was deemed in                            satisfactory condition to undergo the procedure.                           After obtaining informed consent, the colonoscope  was passed under direct vision. Throughout the                            procedure, the patient's blood pressure, pulse, and                            oxygen saturations were monitored continuously. The                            Olympus CF-HQ190L [id number]) Colonoscope was                            introduced through the anus and advanced to the the                            cecum, identified  by appendiceal orifice and                            ileocecal valve. The colonoscopy was performed                            without difficulty. The patient tolerated the                            procedure well. The quality of the bowel                            preparation was good. The ileocecal valve,                            appendiceal orifice, and rectum were photographed. Scope In: 3:06:51 PM Scope Out: 3:19:10 PM Scope Withdrawal Time: 0 hours 8 minutes 9 seconds  Total Procedure Duration: 0 hours 12 minutes 19 seconds  Findings:                 The entire examined colon appeared normal on direct                            and retroflexion views. Complications:            No immediate complications. Estimated blood loss:                            None. Estimated Blood Loss:     Estimated blood loss: none. Impression:               - The entire examined colon is normal on direct and                            retroflexion views.                           - No polyps or cancers. Recommendation:           - Patient has a contact number available for  emergencies. The signs and symptoms of potential                            delayed complications were discussed with the                            patient. Return to normal activities tomorrow.                            Written discharge instructions were provided to the                            patient.                           - Resume previous diet.                           - Continue present medications.                           - Repeat colonoscopy in 10 years for screening. Milus Banister, MD 11/23/2021 3:20:57 PM This report has been signed electronically.

## 2021-11-23 NOTE — Progress Notes (Signed)
To pacu, VSS. Report to [email or web address] 

## 2021-11-24 ENCOUNTER — Telehealth: Payer: Self-pay

## 2021-11-24 NOTE — Telephone Encounter (Signed)
Attempted to reach patient for post-procedure f/u call. No answer. Left message for her to please not hesitate to call us if she has any questions/concerns regarding her care. 

## 2021-11-25 ENCOUNTER — Telehealth: Payer: Self-pay

## 2021-11-25 DIAGNOSIS — R7989 Other specified abnormal findings of blood chemistry: Secondary | ICD-10-CM

## 2021-11-25 NOTE — Telephone Encounter (Signed)
-----   Message from Stevan Born, [place] sent at 09/15/2021  1:52 PM EDT ----- Regarding: August 23 Pt needs LFTs per DJ dx R79.89

## 2021-11-25 NOTE — Telephone Encounter (Signed)
Left message on voicemail that patient is due for lab work in August.  Advised that no appointment is needed for lab work.

## 2021-12-08 ENCOUNTER — Other Ambulatory Visit: Payer: Self-pay | Admitting: Urology

## 2021-12-08 DIAGNOSIS — Z85528 Personal history of other malignant neoplasm of kidney: Secondary | ICD-10-CM

## 2021-12-20 ENCOUNTER — Ambulatory Visit
Admission: RE | Admit: 2021-12-20 | Discharge: 2021-12-20 | Disposition: A | Discharge status: Home / Self Care | Payer: BC Managed Care – PPO | Source: Ambulatory Visit | Attending: Urology | Admitting: Urology

## 2021-12-20 DIAGNOSIS — Z85528 Personal history of other malignant neoplasm of kidney: Secondary | ICD-10-CM

## 2021-12-20 MED ORDER — GADOBENATE DIMEGLUMINE 529 MG/ML IV SOLN
20.0000 mL | Freq: Once | INTRAVENOUS | Status: AC | PRN
  Administered 2021-12-20: 20 mL via INTRAVENOUS

## 2022-04-21 ENCOUNTER — Telehealth: Payer: Self-pay | Admitting: Gastroenterology

## 2022-04-21 ENCOUNTER — Other Ambulatory Visit (INDEPENDENT_AMBULATORY_CARE_PROVIDER_SITE_OTHER): Payer: BC Managed Care – PPO

## 2022-04-21 DIAGNOSIS — R7989 Other specified abnormal findings of blood chemistry: Secondary | ICD-10-CM

## 2022-04-21 LAB — HEPATIC FUNCTION PANEL
ALT: 22 U/L (ref 0–35)
AST: 20 U/L (ref 0–37)
Albumin: 4.2 g/dL (ref 3.5–5.2)
Alkaline Phosphatase: 146 U/L — ABNORMAL HIGH (ref 39–117)
Bilirubin, Direct: 0.1 mg/dL (ref 0.0–0.3)
Total Bilirubin: 0.5 mg/dL (ref 0.2–1.2)
Total Protein: 7.5 g/dL (ref 6.0–8.3)

## 2022-04-21 NOTE — Telephone Encounter (Signed)
Patient needs to schedule follow appointment. Patient has needs labs as per previous note. Called and left message for patient, if she returns calls.

## 2022-04-21 NOTE — Telephone Encounter (Signed)
Patient is calling requesting to Creon refill to last her until the end of the month. Please advise

## 2022-05-17 NOTE — Progress Notes (Signed)
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 She is known by Dr. Ardis Hughs.  She presents today for her Creon refill.  She takes Creon 2 capsules with each meal and takes 1/3 capsule if she eats a heavier or larger meal.  As long as she takes Creon consistently, she passes 1-3 formed brown bowel movements daily.  If she stops Creon she passes orange greasy stools.  No rectal bleeding or black stools.  She sometimes has mild urgency with defecation.  No GERD symptoms.  She underwent routine laboratory studies with her PCP in [place] 1 week ago which she reported were normal.      Latest Ref Rng & Units 07/06/2021    4:24 PM 10/20/2020    6:38 AM 10/18/2020    1:36 PM  CBC  WBC 4.0 - 10.5 K/uL 9.5  6.7  7.3   Hemoglobin 12.0 - 15.0 g/dL 12.0  12.0  12.8   Hematocrit 36.0 - 46.0 % 36.0  36.8  39.3   Platelets 150.0 - 400.0 K/uL 341.0  340  387        Latest Ref Rng & Units 04/21/2022    3:28 PM 09/13/2021    3:06 PM 07/06/2021    4:24 PM  CMP  Glucose 70 - 99 mg/dL   70   BUN 6 - 23 mg/dL   8   Creatinine 0.40 - 1.20 mg/dL   1.04   Sodium 135 - 145 mEq/L   136   Potassium 3.5 - 5.1 mEq/L   4.3   Chloride 96 - 112 mEq/L   102   CO2 19 - 32 mEq/L   29   Calcium 8.4 - 10.5 mg/dL   8.9   Total Protein 6.0 - 8.3 g/dL 7.5  7.5  7.3   Total Bilirubin 0.2 - 1.2 mg/dL 0.5  0.5  0.5   Alkaline Phos 39 - 117 U/L 146  169  215   AST 0 - 37 U/L 20  30  48   ALT 0 - 35 U/L 22  32  37      Review of pertinent gastrointestinal problems: 1. Incidental pancreatic cyst:  cystic lesion pancreatic head noted 09/2011; EUS 4.1cm  multiloculated without concerning mophrology; Cytology negative CEA less than 0.2ng/mL Amylase 42 U/L;  EUS 09/2011 50m max MRI 01/2013: 377mby 2268m MRI 07/2013 35m25m 33mm40mCT 11/2014: 40mm 30m3mm b38mmm.  66m/2017: 46mm by 40m by 357m   MR58m/2017 49 mm x 35 mm x 41 mm. Whipple surgery 2018.  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  Complicated by postoperative chronic elevated liver tests, alk phos and potential secondary sclerosing cirrhosis.  Eventual ERCP January 2021, June 2021 and then November 2021 all with Dr. Mansouraty [facility] MRI 12/20/2021: 1. Cryoablation defect in the posteromedial interpolar right kidney without evidence of local residual/recurrent disease. 2. Partially exophytic 5 mm right lower pole renal lesion is T2 hyperintense with very mild intrinsic T1 hyperintensity without evidence of postcontrast enhancement, stable from prior evaluations and likely reflecting a small hemorrhagic/proteinaceous cyst. Attention  on follow-up imaging suggested. 3. No evidence of metastatic disease in the abdomen. 4. Stable postsurgical changes of Whipple procedure. 5. Moderate diffuse hepatic steatosis.  Colonoscopy 11/23/2021:  Normal colonoscopy  Recall colonoscopy 10 years   Current Medications, Allergies, Past Medical History, Past Surgical History, Family History and Social History were reviewed in [employer] record.   Review of Systems:   Constitutional: Negative for fever, sweats, chills or weight loss.  Respiratory: Negative for shortness of breath.   Cardiovascular: Negative for chest pain, palpitations and leg swelling.  Gastrointestinal: See HPI.  Musculoskeletal: Negative for back pain or muscle aches.  Neurological: Negative for dizziness, headaches or paresthesias.    Physical Exam: LMP  (LMP Unknown)  Wt Readings from Last 3 Encounters:  05/18/22 218 lb (98.9 kg)  11/23/21 220 lb (99.8 kg)   11/04/21 220 lb (99.8 kg)    General: 52 year old female in no acute distress. Head: Normocephalic and atraumatic. Eyes: No scleral icterus. Conjunctiva pink . Ears: Normal auditory acuity. Mouth: Dentition intact. No ulcers or lesions. 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 Lungs: Clear throughout to auscultation. Heart: Regular rate and rhythm, no murmur. Abdomen: Soft, nontender and nondistended. No masses or hepatomegaly. Normal bowel sounds x 4 quadrants. Upper midline abdominal scar intact.  Rectal: Deferred.  Musculoskeletal: Symmetrical with no gross deformities. Extremities: No edema. Neurological: Alert oriented x 4. No focal deficits.  Psychological: Alert and cooperative. 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  Normal stools as long as she remains on Creon.  Stable weight.  She reported labs completed by PCP 1 week ago were normal.  No biliary ductal dilatation per MRI 12/20/2021. -Continue Creon 36,000 take 2 capsules with each meal, 3 capsules if needed when eating heavier foods or larger meal. -Follow-up in 1 year and as needed  2) Colon cancer screening.  Normal colonoscopy 10/2021. -Next colonoscopy due 10/2031  3) History of renal cell cancer s/p cryoablation.  No evidence of metastatic disease per abdominal MRI 12/20/2021.

## 2022-05-18 ENCOUNTER — Encounter: Payer: Self-pay | Admitting: Nurse Practitioner

## 2022-05-18 ENCOUNTER — Ambulatory Visit (INDEPENDENT_AMBULATORY_CARE_PROVIDER_SITE_OTHER): Payer: BC Managed Care – PPO | Admitting: Nurse Practitioner

## 2022-05-18 VITALS — BP 120/72 | HR 69 | Ht 67.0 in | Wt 218.0 lb

## 2022-05-18 DIAGNOSIS — K8681 Exocrine pancreatic insufficiency: Secondary | ICD-10-CM

## 2022-05-18 MED ORDER — PANCRELIPASE (LIP-PROT-AMYL) 36000-114000 UNITS PO CPEP: ORAL_CAPSULE | ORAL | 3 refills | Status: DC

## 2022-05-18 NOTE — Progress Notes (Signed)
Agree with assessment/plan.  Raj Mirissa Lopresti, MD [facility] [phone]  

## 2022-05-18 NOTE — Patient Instructions (Signed)
Continue Creon as prescribed.  Follow up in 1 year or as needed.  Thank you for trusting me with your gastrointestinal care!   Carl Best, CRNP

## 2022-09-21 ENCOUNTER — Other Ambulatory Visit: Payer: Self-pay

## 2022-09-21 ENCOUNTER — Telehealth: Payer: Self-pay | Admitting: Nurse Practitioner

## 2022-09-21 MED ORDER — PANCRELIPASE (LIP-PROT-AMYL) 36000-114000 UNITS PO CPEP: ORAL_CAPSULE | ORAL | 3 refills | Status: AC

## 2022-09-21 NOTE — Telephone Encounter (Signed)
Inbound call from patient, would like refill on creon, sent to [facility].

## 2022-09-21 NOTE — Telephone Encounter (Signed)
Refill for Creon sent to [facility].

## 2022-10-11 ENCOUNTER — Other Ambulatory Visit: Payer: Self-pay | Admitting: Urology

## 2022-10-11 DIAGNOSIS — Z85528 Personal history of other malignant neoplasm of kidney: Secondary | ICD-10-CM

## 2022-11-06 ENCOUNTER — Ambulatory Visit
Admission: RE | Admit: 2022-11-06 | Discharge: 2022-11-06 | Disposition: A | Discharge status: Home / Self Care | Payer: BC Managed Care – PPO | Source: Ambulatory Visit | Attending: Urology | Admitting: Urology

## 2022-11-06 DIAGNOSIS — Z85528 Personal history of other malignant neoplasm of kidney: Secondary | ICD-10-CM

## 2022-11-06 MED ORDER — GADOPICLENOL 0.5 MMOL/ML IV SOLN
10.0000 mL | Freq: Once | INTRAVENOUS | Status: AC | PRN
  Administered 2022-11-06: 10 mL via INTRAVENOUS

## 2023-10-02 ENCOUNTER — Other Ambulatory Visit: Payer: Self-pay | Admitting: Urology

## 2023-10-02 DIAGNOSIS — Z85528 Personal history of other malignant neoplasm of kidney: Secondary | ICD-10-CM

## 2023-12-06 ENCOUNTER — Ambulatory Visit
Admission: RE | Admit: 2023-12-06 | Discharge: 2023-12-06 | Disposition: A | Discharge status: Home / Self Care | Source: Ambulatory Visit | Attending: Urology | Admitting: Urology

## 2023-12-06 ENCOUNTER — Other Ambulatory Visit: Payer: Self-pay | Admitting: Urology

## 2023-12-06 DIAGNOSIS — Z85528 Personal history of other malignant neoplasm of kidney: Secondary | ICD-10-CM

## 2023-12-06 MED ORDER — GADOPICLENOL 0.5 MMOL/ML IV SOLN
7.0000 mL | Freq: Once | INTRAVENOUS | Status: AC | PRN
  Administered 2023-12-06: 7 mL via INTRAVENOUS
